# Patient Record
Sex: Female | Born: 1935 | Race: White | Hispanic: No | Marital: Married | Smoking: Never smoker
Health system: Southern US, Community
[De-identification: ages and names within clinical notes are randomized; demographics above are authoritative.]

## PROBLEM LIST (undated history)

## (undated) DIAGNOSIS — N3090 Cystitis, unspecified without hematuria: Secondary | ICD-10-CM

## (undated) DIAGNOSIS — E041 Nontoxic single thyroid nodule: Secondary | ICD-10-CM

## (undated) DIAGNOSIS — M8580 Other specified disorders of bone density and structure, unspecified site: Secondary | ICD-10-CM

## (undated) DIAGNOSIS — R011 Cardiac murmur, unspecified: Secondary | ICD-10-CM

## (undated) DIAGNOSIS — Z950 Presence of cardiac pacemaker: Secondary | ICD-10-CM

## (undated) DIAGNOSIS — C801 Malignant (primary) neoplasm, unspecified: Secondary | ICD-10-CM

## (undated) DIAGNOSIS — E039 Hypothyroidism, unspecified: Secondary | ICD-10-CM

## (undated) DIAGNOSIS — N183 Chronic kidney disease, stage 3 unspecified: Secondary | ICD-10-CM

## (undated) DIAGNOSIS — I82409 Acute embolism and thrombosis of unspecified deep veins of unspecified lower extremity: Secondary | ICD-10-CM

## (undated) DIAGNOSIS — I429 Cardiomyopathy, unspecified: Secondary | ICD-10-CM

## (undated) DIAGNOSIS — I10 Essential (primary) hypertension: Secondary | ICD-10-CM

## (undated) DIAGNOSIS — I472 Ventricular tachycardia: Secondary | ICD-10-CM

## (undated) DIAGNOSIS — R002 Palpitations: Secondary | ICD-10-CM

## (undated) DIAGNOSIS — M1990 Unspecified osteoarthritis, unspecified site: Secondary | ICD-10-CM

## (undated) DIAGNOSIS — I2510 Atherosclerotic heart disease of native coronary artery without angina pectoris: Secondary | ICD-10-CM

## (undated) DIAGNOSIS — R001 Bradycardia, unspecified: Secondary | ICD-10-CM

## (undated) HISTORY — PX: TONSILLECTOMY: SUR1361

## (undated) HISTORY — DX: Other specified disorders of bone density and structure, unspecified site: M85.80

## (undated) HISTORY — PX: CATARACT EXTRACTION W/ INTRAOCULAR LENS  IMPLANT, BILATERAL: SHX1307

## (undated) HISTORY — DX: Palpitations: R00.2

## (undated) HISTORY — PX: CHOLECYSTECTOMY: SHX55

## (undated) HISTORY — PX: DILATION AND CURETTAGE OF UTERUS: SHX78

## (undated) HISTORY — DX: Acute embolism and thrombosis of unspecified deep veins of unspecified lower extremity: I82.409

## (undated) HISTORY — PX: URETER SURGERY: SHX823

## (undated) HISTORY — DX: Malignant (primary) neoplasm, unspecified: C80.1

## (undated) HISTORY — DX: Nontoxic single thyroid nodule: E04.1

## (undated) HISTORY — DX: Cardiomyopathy, unspecified: I42.9

## (undated) HISTORY — DX: Essential (primary) hypertension: I10

## (undated) HISTORY — DX: Bradycardia, unspecified: R00.1

## (undated) HISTORY — PX: OTHER SURGICAL HISTORY: SHX169

---
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
## 2001-02-09 ENCOUNTER — Other Ambulatory Visit: Admission: RE | Admit: 2001-02-09 | Discharge: 2001-02-09 | Payer: Self-pay | Admitting: Emergency Medicine

## 2001-02-16 ENCOUNTER — Encounter: Payer: Self-pay | Admitting: Emergency Medicine

## 2001-02-16 ENCOUNTER — Encounter: Admission: RE | Admit: 2001-02-16 | Discharge: 2001-02-16 | Payer: Self-pay | Admitting: Emergency Medicine

## 2004-07-12 ENCOUNTER — Encounter: Admission: RE | Admit: 2004-07-12 | Discharge: 2004-07-12 | Payer: Self-pay | Admitting: Emergency Medicine

## 2004-07-25 ENCOUNTER — Encounter: Admission: RE | Admit: 2004-07-25 | Discharge: 2004-07-25 | Payer: Self-pay | Admitting: Emergency Medicine

## 2004-08-06 ENCOUNTER — Ambulatory Visit: Payer: Self-pay | Admitting: Internal Medicine

## 2004-09-04 ENCOUNTER — Encounter: Admission: RE | Admit: 2004-09-04 | Discharge: 2004-09-04 | Payer: Self-pay | Admitting: Emergency Medicine

## 2004-09-12 ENCOUNTER — Ambulatory Visit: Payer: Self-pay | Admitting: Internal Medicine

## 2004-09-19 ENCOUNTER — Ambulatory Visit: Payer: Self-pay | Admitting: Internal Medicine

## 2007-01-21 ENCOUNTER — Encounter: Admission: RE | Admit: 2007-01-21 | Discharge: 2007-01-21 | Payer: Self-pay | Admitting: Emergency Medicine

## 2008-03-23 ENCOUNTER — Encounter: Admission: RE | Admit: 2008-03-23 | Discharge: 2008-03-23 | Payer: Self-pay | Admitting: Emergency Medicine

## 2009-02-14 ENCOUNTER — Other Ambulatory Visit: Admission: RE | Admit: 2009-02-14 | Discharge: 2009-02-14 | Payer: Self-pay | Admitting: Geriatric Medicine

## 2009-03-28 ENCOUNTER — Encounter: Admission: RE | Admit: 2009-03-28 | Discharge: 2009-03-28 | Payer: Self-pay | Admitting: Geriatric Medicine

## 2009-07-31 ENCOUNTER — Encounter: Admission: RE | Admit: 2009-07-31 | Discharge: 2009-07-31 | Payer: Self-pay | Admitting: Internal Medicine

## 2009-09-13 ENCOUNTER — Encounter: Admission: RE | Admit: 2009-09-13 | Discharge: 2009-09-13 | Payer: Self-pay | Admitting: Geriatric Medicine

## 2010-02-16 ENCOUNTER — Ambulatory Visit: Payer: Self-pay | Admitting: Obstetrics and Gynecology

## 2010-02-21 ENCOUNTER — Ambulatory Visit: Payer: Self-pay | Admitting: Obstetrics and Gynecology

## 2010-02-21 ENCOUNTER — Encounter: Admission: RE | Admit: 2010-02-21 | Discharge: 2010-02-21 | Payer: Self-pay | Admitting: Obstetrics and Gynecology

## 2010-02-27 ENCOUNTER — Ambulatory Visit: Payer: Self-pay | Admitting: Obstetrics and Gynecology

## 2010-02-27 ENCOUNTER — Ambulatory Visit (HOSPITAL_BASED_OUTPATIENT_CLINIC_OR_DEPARTMENT_OTHER): Admission: RE | Admit: 2010-02-27 | Discharge: 2010-02-27 | Payer: Self-pay | Admitting: Obstetrics and Gynecology

## 2010-03-20 ENCOUNTER — Ambulatory Visit: Payer: Self-pay | Admitting: Obstetrics and Gynecology

## 2010-03-26 ENCOUNTER — Encounter: Admission: RE | Admit: 2010-03-26 | Discharge: 2010-03-26 | Payer: Self-pay | Admitting: Geriatric Medicine

## 2010-08-19 ENCOUNTER — Encounter: Payer: Self-pay | Admitting: Emergency Medicine

## 2011-04-15 ENCOUNTER — Other Ambulatory Visit: Payer: Self-pay | Admitting: Geriatric Medicine

## 2011-04-15 DIAGNOSIS — Z1231 Encounter for screening mammogram for malignant neoplasm of breast: Secondary | ICD-10-CM

## 2011-05-01 ENCOUNTER — Ambulatory Visit
Admission: RE | Admit: 2011-05-01 | Discharge: 2011-05-01 | Disposition: A | Discharge status: Home / Self Care | Payer: Medicare Other | Source: Ambulatory Visit | Attending: Geriatric Medicine | Admitting: Geriatric Medicine

## 2011-05-01 DIAGNOSIS — Z1231 Encounter for screening mammogram for malignant neoplasm of breast: Secondary | ICD-10-CM

## 2011-06-19 ENCOUNTER — Encounter: Payer: Self-pay | Admitting: Gynecology

## 2011-06-19 DIAGNOSIS — I10 Essential (primary) hypertension: Secondary | ICD-10-CM | POA: Insufficient documentation

## 2011-06-19 DIAGNOSIS — N840 Polyp of corpus uteri: Secondary | ICD-10-CM | POA: Insufficient documentation

## 2011-07-01 ENCOUNTER — Ambulatory Visit (INDEPENDENT_AMBULATORY_CARE_PROVIDER_SITE_OTHER): Payer: Medicare Other | Admitting: Obstetrics and Gynecology

## 2011-07-01 ENCOUNTER — Encounter: Payer: Self-pay | Admitting: Obstetrics and Gynecology

## 2011-07-01 ENCOUNTER — Other Ambulatory Visit (HOSPITAL_COMMUNITY)
Admission: RE | Admit: 2011-07-01 | Discharge: 2011-07-01 | Disposition: A | Discharge status: Home / Self Care | Payer: Medicare Other | Source: Ambulatory Visit | Attending: Obstetrics and Gynecology | Admitting: Obstetrics and Gynecology

## 2011-07-01 VITALS — BP 120/76 | Ht 63.0 in | Wt 132.0 lb

## 2011-07-01 DIAGNOSIS — N840 Polyp of corpus uteri: Secondary | ICD-10-CM

## 2011-07-01 DIAGNOSIS — M899 Disorder of bone, unspecified: Secondary | ICD-10-CM

## 2011-07-01 DIAGNOSIS — Z124 Encounter for screening for malignant neoplasm of cervix: Secondary | ICD-10-CM | POA: Insufficient documentation

## 2011-07-01 DIAGNOSIS — M8580 Other specified disorders of bone density and structure, unspecified site: Secondary | ICD-10-CM

## 2011-07-01 DIAGNOSIS — N952 Postmenopausal atrophic vaginitis: Secondary | ICD-10-CM

## 2011-07-01 DIAGNOSIS — R634 Abnormal weight loss: Secondary | ICD-10-CM

## 2011-07-01 NOTE — Progress Notes (Signed)
Patient came to see me today for further followup. The last time I saw her her was when I removed her endometrial polyp. Since then she's had no vaginal bleeding. She's had no pelvic pain. She is up-to-date on mammograms. She has had a weight loss of approximately 18 pounds. It's not been associated with any GI or GU symptoms. Her husband is very sick and she has changed how she cooks for him. There are much healthier diet now and I suspect that's the weight loss. She is also discussed this with her PCP. She does have vaginal dryness but they're not sexually active and is not enough of an issue to treat. She also has osteopenia and is due for a bone density. She takes calcium and vitamin D. She's had no fractures.  ROS: 12 system review done. Some of pertinent positives listed above. Other pertinent positives include hypothyroidism, cardiac arrhythmia, and hypertension.  Physical examination:  Kennon Portela present HEENT within normal limits. Neck: Thyroid not large. No masses. Supraclavicular nodes: not enlarged. Breasts: Examined in both sitting midline position. No skin changes and no masses. Abdomen: Soft no guarding rebound or masses or hernia. Pelvic: External: Within normal limits. BUS: Within normal limits. Vaginal:within normal limits. Good estrogen effect. No evidence of cystocele rectocele or enterocele. Cervix: clean. Uterus: Normal size and shape. Adnexa: No masses. Rectovaginal exam: Confirmatory and negative. Extremities: Within normal limits.  Assessment: #1. Osteopenia #2. Weight loss #3. Atrophic vaginitis #4. Endometrial polyp  Plan: Patient is scheduled bone density with next mammogram. I don't believe her weight losses is gynecological related. However if it continues I think we should do a pelvic ultrasound. Continue yearly mammograms.

## 2011-08-26 DIAGNOSIS — Z0000 Encounter for general adult medical examination without abnormal findings: Secondary | ICD-10-CM | POA: Diagnosis not present

## 2011-08-26 DIAGNOSIS — I10 Essential (primary) hypertension: Secondary | ICD-10-CM | POA: Diagnosis not present

## 2011-08-26 DIAGNOSIS — J309 Allergic rhinitis, unspecified: Secondary | ICD-10-CM | POA: Diagnosis not present

## 2011-09-02 DIAGNOSIS — E538 Deficiency of other specified B group vitamins: Secondary | ICD-10-CM | POA: Diagnosis not present

## 2011-09-02 DIAGNOSIS — Z23 Encounter for immunization: Secondary | ICD-10-CM | POA: Diagnosis not present

## 2011-12-09 DIAGNOSIS — I10 Essential (primary) hypertension: Secondary | ICD-10-CM | POA: Diagnosis not present

## 2011-12-09 DIAGNOSIS — E538 Deficiency of other specified B group vitamins: Secondary | ICD-10-CM | POA: Diagnosis not present

## 2012-01-15 DIAGNOSIS — R05 Cough: Secondary | ICD-10-CM | POA: Diagnosis not present

## 2012-01-15 DIAGNOSIS — R059 Cough, unspecified: Secondary | ICD-10-CM | POA: Diagnosis not present

## 2012-01-28 DIAGNOSIS — R5381 Other malaise: Secondary | ICD-10-CM | POA: Diagnosis not present

## 2012-01-28 DIAGNOSIS — I10 Essential (primary) hypertension: Secondary | ICD-10-CM | POA: Diagnosis not present

## 2012-01-28 DIAGNOSIS — R05 Cough: Secondary | ICD-10-CM | POA: Diagnosis not present

## 2012-01-28 DIAGNOSIS — R634 Abnormal weight loss: Secondary | ICD-10-CM | POA: Diagnosis not present

## 2012-01-28 DIAGNOSIS — R5383 Other fatigue: Secondary | ICD-10-CM | POA: Diagnosis not present

## 2012-01-28 DIAGNOSIS — J309 Allergic rhinitis, unspecified: Secondary | ICD-10-CM | POA: Diagnosis not present

## 2012-02-18 DIAGNOSIS — I8310 Varicose veins of unspecified lower extremity with inflammation: Secondary | ICD-10-CM | POA: Diagnosis not present

## 2012-02-18 DIAGNOSIS — I10 Essential (primary) hypertension: Secondary | ICD-10-CM | POA: Diagnosis not present

## 2012-02-18 DIAGNOSIS — R634 Abnormal weight loss: Secondary | ICD-10-CM | POA: Diagnosis not present

## 2012-02-18 DIAGNOSIS — Z1331 Encounter for screening for depression: Secondary | ICD-10-CM | POA: Diagnosis not present

## 2012-02-18 DIAGNOSIS — E039 Hypothyroidism, unspecified: Secondary | ICD-10-CM | POA: Diagnosis not present

## 2012-04-14 DIAGNOSIS — E538 Deficiency of other specified B group vitamins: Secondary | ICD-10-CM | POA: Diagnosis not present

## 2012-04-21 DIAGNOSIS — R609 Edema, unspecified: Secondary | ICD-10-CM | POA: Diagnosis not present

## 2012-04-21 DIAGNOSIS — I129 Hypertensive chronic kidney disease with stage 1 through stage 4 chronic kidney disease, or unspecified chronic kidney disease: Secondary | ICD-10-CM | POA: Diagnosis not present

## 2012-05-19 DIAGNOSIS — E538 Deficiency of other specified B group vitamins: Secondary | ICD-10-CM | POA: Diagnosis not present

## 2012-05-19 DIAGNOSIS — Z79899 Other long term (current) drug therapy: Secondary | ICD-10-CM | POA: Diagnosis not present

## 2012-05-19 DIAGNOSIS — I498 Other specified cardiac arrhythmias: Secondary | ICD-10-CM | POA: Diagnosis not present

## 2012-05-19 DIAGNOSIS — I10 Essential (primary) hypertension: Secondary | ICD-10-CM | POA: Diagnosis not present

## 2012-05-25 ENCOUNTER — Other Ambulatory Visit: Payer: Self-pay | Admitting: Obstetrics and Gynecology

## 2012-05-25 ENCOUNTER — Telehealth: Payer: Self-pay | Admitting: *Deleted

## 2012-05-25 DIAGNOSIS — M8580 Other specified disorders of bone density and structure, unspecified site: Secondary | ICD-10-CM

## 2012-05-25 DIAGNOSIS — Z1231 Encounter for screening mammogram for malignant neoplasm of breast: Secondary | ICD-10-CM

## 2012-05-25 NOTE — Telephone Encounter (Signed)
Pt called requesting order for bone density placed at [facility]. Per office note, order will be placed.

## 2012-06-22 DIAGNOSIS — E538 Deficiency of other specified B group vitamins: Secondary | ICD-10-CM | POA: Diagnosis not present

## 2012-06-23 DIAGNOSIS — I10 Essential (primary) hypertension: Secondary | ICD-10-CM | POA: Diagnosis not present

## 2012-06-23 DIAGNOSIS — I498 Other specified cardiac arrhythmias: Secondary | ICD-10-CM | POA: Diagnosis not present

## 2012-06-30 ENCOUNTER — Ambulatory Visit
Admission: RE | Admit: 2012-06-30 | Discharge: 2012-06-30 | Disposition: A | Discharge status: Home / Self Care | Payer: Medicare Other | Source: Ambulatory Visit | Attending: Obstetrics and Gynecology | Admitting: Obstetrics and Gynecology

## 2012-06-30 DIAGNOSIS — M8580 Other specified disorders of bone density and structure, unspecified site: Secondary | ICD-10-CM

## 2012-06-30 DIAGNOSIS — Z1231 Encounter for screening mammogram for malignant neoplasm of breast: Secondary | ICD-10-CM | POA: Diagnosis not present

## 2012-06-30 DIAGNOSIS — M899 Disorder of bone, unspecified: Secondary | ICD-10-CM | POA: Diagnosis not present

## 2012-06-30 DIAGNOSIS — M949 Disorder of cartilage, unspecified: Secondary | ICD-10-CM | POA: Diagnosis not present

## 2012-07-07 ENCOUNTER — Ambulatory Visit (INDEPENDENT_AMBULATORY_CARE_PROVIDER_SITE_OTHER): Payer: Medicare Other | Admitting: Obstetrics and Gynecology

## 2012-07-07 ENCOUNTER — Encounter: Payer: Self-pay | Admitting: Obstetrics and Gynecology

## 2012-07-07 VITALS — BP 130/70 | HR 56 | Ht 63.5 in | Wt 125.0 lb

## 2012-07-07 DIAGNOSIS — N950 Postmenopausal bleeding: Secondary | ICD-10-CM

## 2012-07-07 DIAGNOSIS — R634 Abnormal weight loss: Secondary | ICD-10-CM

## 2012-07-07 DIAGNOSIS — N952 Postmenopausal atrophic vaginitis: Secondary | ICD-10-CM

## 2012-07-07 DIAGNOSIS — M899 Disorder of bone, unspecified: Secondary | ICD-10-CM

## 2012-07-07 DIAGNOSIS — M949 Disorder of cartilage, unspecified: Secondary | ICD-10-CM | POA: Diagnosis not present

## 2012-07-07 DIAGNOSIS — M8580 Other specified disorders of bone density and structure, unspecified site: Secondary | ICD-10-CM

## 2012-07-07 NOTE — Patient Instructions (Signed)
Report new vaginal bleeding.

## 2012-07-07 NOTE — Progress Notes (Signed)
Patient came to see me today for further followup. She just had a mammogram which was normal. She does have a bone density which showed osteopenia with progressive bone loss in her wrist. She now has an elevated fracture risk in her hip of 3.6%. She takes calcium and vitamin D. She has not had a fracture. Since we removed her endometrial polyp she's had no further postmenopausal bleeding. She is having no pelvic pain. She has always had normal Pap smears.Her last Pap smear was 2012. She does have atrophic vaginitis but is not symptomatic enough to require treatment. She is not sexually active due to her husband's health. Laser we discussed that she had lost 18 pounds. This year she has lost an additional 7 pounds. Her husband is in poor health and she attributes the  weight loss her being a caretaker.  ROS: 12 system review done. Pertinent positives above. Other positive is  Hypertension.  Physical examination:Michelle Winters present. HEENT within normal limits. Neck: Thyroid not large. No masses. Supraclavicular nodes: not enlarged. Breasts: Examined in both sitting and lying  position. No skin changes and no masses. Abdomen: Soft no guarding rebound or masses or hernia. Pelvic: External: Within normal limits. BUS: Within normal limits. Vaginal:within normal limits. Poor estrogen effect. No evidence of cystocele rectocele or enterocele. Cervix: clean. Uterus: Normal size and shape. Adnexa: No masses. Rectovaginal exam: Confirmatory and negative. Extremities: Within normal limits.  Assessment: #1. Atrophic vaginitis #2. Osteopenia #3. Endometrial polyp with postmenopausal bleeding #4. Weight loss  Plan: We discussed that her bone density had  Gotten  Worse. We discussed biphosphonate treatment. She understands the risks. For the moment she declined. Pap not done.The new Pap smear guidelines were discussed with the patient. Observation of atrophic vaginitis. She will report new bleeding.Continue to watch  weight.

## 2012-07-14 DIAGNOSIS — R079 Chest pain, unspecified: Secondary | ICD-10-CM | POA: Diagnosis not present

## 2012-07-14 DIAGNOSIS — I10 Essential (primary) hypertension: Secondary | ICD-10-CM | POA: Diagnosis not present

## 2012-07-14 DIAGNOSIS — Z79899 Other long term (current) drug therapy: Secondary | ICD-10-CM | POA: Diagnosis not present

## 2012-08-24 DIAGNOSIS — R05 Cough: Secondary | ICD-10-CM | POA: Diagnosis not present

## 2012-08-24 DIAGNOSIS — E538 Deficiency of other specified B group vitamins: Secondary | ICD-10-CM | POA: Diagnosis not present

## 2012-09-25 DIAGNOSIS — E538 Deficiency of other specified B group vitamins: Secondary | ICD-10-CM | POA: Diagnosis not present

## 2012-09-25 DIAGNOSIS — I10 Essential (primary) hypertension: Secondary | ICD-10-CM | POA: Diagnosis not present

## 2012-09-25 DIAGNOSIS — R609 Edema, unspecified: Secondary | ICD-10-CM | POA: Diagnosis not present

## 2012-10-16 DIAGNOSIS — I10 Essential (primary) hypertension: Secondary | ICD-10-CM | POA: Diagnosis not present

## 2012-11-09 DIAGNOSIS — E538 Deficiency of other specified B group vitamins: Secondary | ICD-10-CM | POA: Diagnosis not present

## 2013-01-04 DIAGNOSIS — E538 Deficiency of other specified B group vitamins: Secondary | ICD-10-CM | POA: Diagnosis not present

## 2013-02-08 DIAGNOSIS — G4730 Sleep apnea, unspecified: Secondary | ICD-10-CM | POA: Diagnosis not present

## 2013-02-08 DIAGNOSIS — Z1331 Encounter for screening for depression: Secondary | ICD-10-CM | POA: Diagnosis not present

## 2013-02-08 DIAGNOSIS — I872 Venous insufficiency (chronic) (peripheral): Secondary | ICD-10-CM | POA: Diagnosis not present

## 2013-02-08 DIAGNOSIS — E039 Hypothyroidism, unspecified: Secondary | ICD-10-CM | POA: Diagnosis not present

## 2013-02-08 DIAGNOSIS — Z79899 Other long term (current) drug therapy: Secondary | ICD-10-CM | POA: Diagnosis not present

## 2013-02-08 DIAGNOSIS — Z0000 Encounter for general adult medical examination without abnormal findings: Secondary | ICD-10-CM | POA: Diagnosis not present

## 2013-02-16 DIAGNOSIS — E538 Deficiency of other specified B group vitamins: Secondary | ICD-10-CM | POA: Diagnosis not present

## 2013-03-24 DIAGNOSIS — E538 Deficiency of other specified B group vitamins: Secondary | ICD-10-CM | POA: Diagnosis not present

## 2013-04-12 DIAGNOSIS — I495 Sick sinus syndrome: Secondary | ICD-10-CM | POA: Diagnosis not present

## 2013-04-12 DIAGNOSIS — I499 Cardiac arrhythmia, unspecified: Secondary | ICD-10-CM | POA: Diagnosis not present

## 2013-04-12 DIAGNOSIS — R5381 Other malaise: Secondary | ICD-10-CM | POA: Diagnosis not present

## 2013-04-12 DIAGNOSIS — Z79899 Other long term (current) drug therapy: Secondary | ICD-10-CM | POA: Diagnosis not present

## 2013-04-12 DIAGNOSIS — L299 Pruritus, unspecified: Secondary | ICD-10-CM | POA: Diagnosis not present

## 2013-04-12 DIAGNOSIS — I498 Other specified cardiac arrhythmias: Secondary | ICD-10-CM | POA: Diagnosis not present

## 2013-04-12 DIAGNOSIS — I129 Hypertensive chronic kidney disease with stage 1 through stage 4 chronic kidney disease, or unspecified chronic kidney disease: Secondary | ICD-10-CM | POA: Diagnosis not present

## 2013-04-12 DIAGNOSIS — E538 Deficiency of other specified B group vitamins: Secondary | ICD-10-CM | POA: Diagnosis not present

## 2013-04-15 DIAGNOSIS — I499 Cardiac arrhythmia, unspecified: Secondary | ICD-10-CM | POA: Diagnosis not present

## 2013-04-19 DIAGNOSIS — I498 Other specified cardiac arrhythmias: Secondary | ICD-10-CM | POA: Diagnosis not present

## 2013-04-22 DIAGNOSIS — I471 Supraventricular tachycardia: Secondary | ICD-10-CM | POA: Diagnosis not present

## 2013-04-22 DIAGNOSIS — I498 Other specified cardiac arrhythmias: Secondary | ICD-10-CM | POA: Diagnosis not present

## 2013-04-22 DIAGNOSIS — I4949 Other premature depolarization: Secondary | ICD-10-CM | POA: Diagnosis not present

## 2013-04-22 DIAGNOSIS — I10 Essential (primary) hypertension: Secondary | ICD-10-CM | POA: Diagnosis not present

## 2013-04-26 DIAGNOSIS — E538 Deficiency of other specified B group vitamins: Secondary | ICD-10-CM | POA: Diagnosis not present

## 2013-05-06 ENCOUNTER — Ambulatory Visit (HOSPITAL_BASED_OUTPATIENT_CLINIC_OR_DEPARTMENT_OTHER): Payer: Medicare Other | Admitting: Radiology

## 2013-05-06 ENCOUNTER — Ambulatory Visit (HOSPITAL_COMMUNITY): Payer: Medicare Other | Attending: Geriatric Medicine | Admitting: Radiology

## 2013-05-06 ENCOUNTER — Other Ambulatory Visit (HOSPITAL_COMMUNITY): Payer: Self-pay | Admitting: Geriatric Medicine

## 2013-05-06 VITALS — BP 112/89 | HR 52 | Ht 63.0 in | Wt 128.0 lb

## 2013-05-06 DIAGNOSIS — I495 Sick sinus syndrome: Secondary | ICD-10-CM | POA: Diagnosis not present

## 2013-05-06 DIAGNOSIS — R002 Palpitations: Secondary | ICD-10-CM | POA: Diagnosis not present

## 2013-05-06 DIAGNOSIS — Z8249 Family history of ischemic heart disease and other diseases of the circulatory system: Secondary | ICD-10-CM | POA: Diagnosis not present

## 2013-05-06 DIAGNOSIS — I10 Essential (primary) hypertension: Secondary | ICD-10-CM | POA: Insufficient documentation

## 2013-05-06 DIAGNOSIS — I4510 Unspecified right bundle-branch block: Secondary | ICD-10-CM | POA: Insufficient documentation

## 2013-05-06 DIAGNOSIS — R5381 Other malaise: Secondary | ICD-10-CM | POA: Diagnosis not present

## 2013-05-06 DIAGNOSIS — I4949 Other premature depolarization: Secondary | ICD-10-CM

## 2013-05-06 DIAGNOSIS — R079 Chest pain, unspecified: Secondary | ICD-10-CM

## 2013-05-06 NOTE — Progress Notes (Signed)
Echocardiogram performed.  

## 2013-05-06 NOTE — Progress Notes (Signed)
[facility] SITE 3 NUCLEAR MED [address] [place], [place] [address] [phone]    Cardiology Nuclear Med Study  Michelle Winters is a 79 y.o. female     MRN : [id number]     DOB: 1932-01-08  Procedure Date: 05/06/2013  Nuclear Med Background Indication for Stress Test:  Evaluation for Ischemia History:  Hx of GXT- Normal Cardiac Risk Factors: Family History - CAD, Hypertension and RBBB  Symptoms:  Fatigue and Palpitations    Nuclear Pre-Procedure Caffeine/Decaff Intake:  None NPO After: 10:00pm    O2 Sat: 98% on room air. IV 0.9% NS with Angio Cath:  22g  IV Site: R Antecubital  IV Started by:  Bonnita Levan, RN  Chest Size (in):  36 Cup Size: B  Height: 5\' 3"  (1.6 m)  Weight:  128 lb (58.06 kg)  BMI:  Body mass index is 22.68 kg/(m^2). Tech Comments:  [name]    Nuclear Med Study 1 or 2 day study: 1 day  Stress Test Type:  Stress  Reading MD: Armanda Magic, MD  Order Authorizing Provider:  Armanda Magic, MD  Resting Radionuclide: Technetium 9m Sestamibi  Resting Radionuclide Dose: 11.0 mCi   Stress Radionuclide:  Technetium 82m Sestamibi  Stress Radionuclide Dose: 33.0 mCi           Stress Protocol Rest HR: 52 Stress HR: 134  Rest BP: 112/89 Stress BP: 213/111  Exercise Time (min): 3:30 METS: 4.6   Predicted Max HR: 141 bpm % Max HR: 95.04 bpm Rate Pressure Product: 46962   Dose of Adenosine (mg):  n/a Dose of Lexiscan: n/a mg  Dose of Atropine (mg): n/a Dose of Dobutamine: n/a mcg/kg/min (at max HR)  Stress Test Technologist: Nelson Chimes, BS-ES  Nuclear Technologist:  Domenic Polite, CNMT     Rest Procedure:  Myocardial perfusion imaging was performed at rest 45 minutes following the intravenous administration of Technetium 38m Sestamibi. Rest ECG: NSR-RBBB and LAHB  Stress Procedure:  The patient exercised on the treadmill utilizing the Bruce Protocol for 3:30 minutes. The patient stopped due to 3-4/10 chest tightness and burning.  Technetium 67m  Sestamibi was injected at peak exercise and myocardial perfusion imaging was performed after a brief delay. Stress ECG: No significant ST segment change suggestive of ischemia. and Insignificant upsloping ST segment depression.  QPS Raw Data Images:  Mild breast attenuation.  Normal left ventricular size. Stress Images:  There is decreased uptake in the anterior wall. Rest Images:  There is decreased uptake in the anterior wall. Subtraction (SDS):  There is a fixed anteriour defect that is most consistent with breast attenuation. Transient Ischemic Dilatation (Normal <1.22):  n/a Lung/Heart Ratio (Normal <0.45):  0.39  Quantitative Gated Spect Images QGS EDV:  92 ml QGS ESV:  47 ml  Impression Exercise Capacity:  Poor exercise capacity. BP Response:  Hypertensive blood pressure response. Clinical Symptoms:  Typical chest pain. ECG Impression:  Significant ST abnormalities consistent with ischemia. Comparison with Prior Nuclear Study: No images to compare  Overall Impression:  Intermediate risk stress nuclear study with ECG and clinical evidence of ischemia but fixed perfusion defect..  LV Ejection Fraction: 49%.  LV Wall Motion:  NL LV Function; NL Wall Motion

## 2013-05-13 ENCOUNTER — Ambulatory Visit: Payer: Medicare Other | Admitting: Cardiology

## 2013-05-13 NOTE — Progress Notes (Signed)
Pt is scheduled for 10/24 with EP

## 2013-05-21 ENCOUNTER — Encounter: Payer: Self-pay | Admitting: Internal Medicine

## 2013-05-21 ENCOUNTER — Encounter (INDEPENDENT_AMBULATORY_CARE_PROVIDER_SITE_OTHER): Payer: Self-pay

## 2013-05-21 ENCOUNTER — Ambulatory Visit (INDEPENDENT_AMBULATORY_CARE_PROVIDER_SITE_OTHER): Payer: Medicare Other | Admitting: Internal Medicine

## 2013-05-21 ENCOUNTER — Telehealth: Payer: Self-pay

## 2013-05-21 VITALS — BP 122/66 | HR 70 | Ht 63.0 in | Wt 131.0 lb

## 2013-05-21 DIAGNOSIS — I498 Other specified cardiac arrhythmias: Secondary | ICD-10-CM

## 2013-05-21 DIAGNOSIS — R001 Bradycardia, unspecified: Secondary | ICD-10-CM

## 2013-05-21 NOTE — Telephone Encounter (Signed)
Received call from Hilda Lias at Dr.Stoneking's office she stated she spoke to Dr.Klein and he requested 04/12/13 monitor strips.She stated they did not have monitor strips that Dr.Turner had strips.Message sent to Dr.Klein.

## 2013-05-21 NOTE — Patient Instructions (Signed)
Your physician recommends that you schedule a follow-up appointment in: 4 WEEKS WITH DR Graciela Husbands

## 2013-05-21 NOTE — Progress Notes (Signed)
Did not remember the history and physical so have deleted note  She was seen subsequently and chart updated

## 2013-05-24 DIAGNOSIS — R5381 Other malaise: Secondary | ICD-10-CM | POA: Diagnosis not present

## 2013-05-24 DIAGNOSIS — I10 Essential (primary) hypertension: Secondary | ICD-10-CM | POA: Diagnosis not present

## 2013-05-31 DIAGNOSIS — E538 Deficiency of other specified B group vitamins: Secondary | ICD-10-CM | POA: Diagnosis not present

## 2013-06-17 ENCOUNTER — Ambulatory Visit (INDEPENDENT_AMBULATORY_CARE_PROVIDER_SITE_OTHER): Payer: Medicare Other | Admitting: Internal Medicine

## 2013-06-17 ENCOUNTER — Encounter: Payer: Self-pay | Admitting: Internal Medicine

## 2013-06-17 VITALS — BP 132/58 | HR 67 | Ht 63.0 in | Wt 132.8 lb

## 2013-06-17 DIAGNOSIS — I4949 Other premature depolarization: Secondary | ICD-10-CM

## 2013-06-17 DIAGNOSIS — R9439 Abnormal result of other cardiovascular function study: Secondary | ICD-10-CM

## 2013-06-17 DIAGNOSIS — R931 Abnormal findings on diagnostic imaging of heart and coronary circulation: Secondary | ICD-10-CM

## 2013-06-17 DIAGNOSIS — I498 Other specified cardiac arrhythmias: Secondary | ICD-10-CM | POA: Diagnosis not present

## 2013-06-17 DIAGNOSIS — R001 Bradycardia, unspecified: Secondary | ICD-10-CM

## 2013-06-17 DIAGNOSIS — I10 Essential (primary) hypertension: Secondary | ICD-10-CM | POA: Diagnosis not present

## 2013-06-17 DIAGNOSIS — I493 Ventricular premature depolarization: Secondary | ICD-10-CM

## 2013-06-17 NOTE — Progress Notes (Signed)
      Patient Care Team: Hal Stoneking, MD as PCP - General (Internal Medicine)   HPI  Michelle Winters is a 79 y.o. female  who was seen him in pursuing to and for which I did not write a note and had forgotten we talked about. She had referred for bradycardia occurred in the context of what we now know her PVCs that are quite frequent. She also undergone stress testing and echocardiogram [facility] which showed inferior wall hypokinesis and an intermediate risk stress nuclear study with evidence of ST segment changes as well as stress-induced ischemia in the inferoapical and lateral segments.  She over the last month has taken her pulse daily and has noted a good correlation between heart rates which he palpates  in the 40 range versus a 50-60 range. She does not have peripheral edema. Past Medical History  Diagnosis Date  . Endometrial polyp   . Thyroid disease   . Heart palpitations   . Hypertension     Past Surgical History  Procedure Laterality Date  . Cholecystectomy    . Dilation and curettage of uterus    . Hysteroscopy    . Tonsillectomy      Current Outpatient Prescriptions  Medication Sig Dispense Refill  . aspirin 81 MG tablet Take 81 mg by mouth daily.      . hydrochlorothiazide (HYDRODIURIL) 25 MG tablet Take 25 mg by mouth daily.        . levothyroxine (SYNTHROID, LEVOTHROID) 50 MCG tablet Take 50 mcg by mouth daily.        . loratadine (CLARITIN) 10 MG tablet Take 10 mg by mouth as needed for allergies.      . losartan (COZAAR) 25 MG tablet Take 25 mg by mouth daily.      . sodium chloride (BRONCHO SALINE) inhaler solution Take 1 spray by nebulization as needed.       No current facility-administered medications for this visit.    No Known Allergies  Review of Systems negative except from HPI and PMH  Physical Exam BP 132/58  Pulse 67  Ht 5' 3" (1.6 m)  Wt 132 lb 12.8 oz (60.238 kg)  BMI 23.53 kg/m2 Well developed and well nourished in no acute  distress HENT normal E scleral and icterus clear Neck Supple JVP flat; carotids brisk and full Clear to ausculation Irregular rate and rhythm, consistent with PVCs no murmurs gallops or rub Soft with active bowel sounds No clubbing cyanosis none Edema Alert and oriented, grossly normal motor and sensory function Skin Warm and Dry    Assessment and  Plan  

## 2013-06-17 NOTE — Assessment & Plan Note (Signed)
As above.

## 2013-06-17 NOTE — Assessment & Plan Note (Signed)
She is evidence of inferolateral ischemia. I reviewed this with Dr. Verlin Fester. There is also ST segment changes consistent with ischemia. We'll undertake catheterization.

## 2013-06-17 NOTE — Assessment & Plan Note (Signed)
She has symptomatic PVCs. They are of the removal morphologies mostly of right bundle branch block origin. Given her abnormal Myoview scan we have discussed the importance of clarifying her coronary anatomy as a potential contributor to her ventricular ectopy.  And discussed potential benefits as well as risks of the procedure.  As she has sinus bradycardia treatment of her ectopy with beta blockers and  calcium blockers is precluded. I should also note that at 1 minute into her stress test her systolic blood pressure was over 200.

## 2013-06-17 NOTE — Patient Instructions (Signed)
Your physician has requested that you have a cardiac catheterization. Cardiac catheterization is used to diagnose and/or treat various heart conditions. Doctors may recommend this procedure for a number of different reasons. The most common reason is to evaluate chest pain. Chest pain can be a symptom of coronary artery disease (CAD), and cardiac catheterization can show whether plaque is narrowing or blocking your heart's arteries. This procedure is also used to evaluate the valves, as well as measure the blood flow and oxygen levels in different parts of your heart. For further information please visit [email or web address] Please follow instruction sheet, as given.  Your physician recommends that you return for lab work in: will be informed once procedure date has been set  Your physician recommends that you continue on your current medications as directed. Please refer to the Current Medication list given to you today. '

## 2013-06-22 ENCOUNTER — Other Ambulatory Visit: Payer: Self-pay | Admitting: *Deleted

## 2013-06-22 ENCOUNTER — Encounter: Payer: Self-pay | Admitting: *Deleted

## 2013-06-22 ENCOUNTER — Telehealth: Payer: Self-pay | Admitting: *Deleted

## 2013-06-22 DIAGNOSIS — I998 Other disorder of circulatory system: Secondary | ICD-10-CM

## 2013-06-22 DIAGNOSIS — R9439 Abnormal result of other cardiovascular function study: Secondary | ICD-10-CM

## 2013-06-22 DIAGNOSIS — I259 Chronic ischemic heart disease, unspecified: Secondary | ICD-10-CM

## 2013-06-22 NOTE — Telephone Encounter (Signed)
Called patient to schedule cardiac catheterization - 07/01/13. Lab work 06/28/2013. Patient agreeable to plan.

## 2013-06-28 ENCOUNTER — Other Ambulatory Visit (INDEPENDENT_AMBULATORY_CARE_PROVIDER_SITE_OTHER): Payer: Medicare Other

## 2013-06-28 ENCOUNTER — Encounter (INDEPENDENT_AMBULATORY_CARE_PROVIDER_SITE_OTHER): Payer: Self-pay

## 2013-06-28 DIAGNOSIS — I2589 Other forms of chronic ischemic heart disease: Secondary | ICD-10-CM | POA: Diagnosis not present

## 2013-06-28 DIAGNOSIS — R9439 Abnormal result of other cardiovascular function study: Secondary | ICD-10-CM

## 2013-06-28 DIAGNOSIS — I999 Unspecified disorder of circulatory system: Secondary | ICD-10-CM | POA: Diagnosis not present

## 2013-06-28 DIAGNOSIS — I259 Chronic ischemic heart disease, unspecified: Secondary | ICD-10-CM

## 2013-06-28 DIAGNOSIS — Z01810 Encounter for preprocedural cardiovascular examination: Secondary | ICD-10-CM | POA: Diagnosis not present

## 2013-06-28 DIAGNOSIS — I998 Other disorder of circulatory system: Secondary | ICD-10-CM

## 2013-06-28 HISTORY — PX: CARDIAC CATHETERIZATION: SHX172

## 2013-06-28 LAB — CBC WITH DIFFERENTIAL/PLATELET
Basophils Relative: 0.5 % (ref 0.0–3.0)
Eosinophils Relative: 2.8 % (ref 0.0–5.0)
Hemoglobin: 13.1 g/dL (ref 12.0–15.0)
Lymphocytes Relative: 20.8 % (ref 12.0–46.0)
Monocytes Relative: 6.9 % (ref 3.0–12.0)
Neutro Abs: 3.5 10*3/uL (ref 1.4–7.7)
Neutrophils Relative %: 69 % (ref 43.0–77.0)
RBC: 4.29 Mil/uL (ref 3.87–5.11)
WBC: 5 10*3/uL (ref 4.5–10.5)

## 2013-06-28 LAB — BASIC METABOLIC PANEL
CO2: 27 mEq/L (ref 19–32)
Calcium: 8.6 mg/dL (ref 8.4–10.5)
GFR: 60.91 mL/min (ref >60.00)
Sodium: 141 mEq/L (ref 135–145)

## 2013-06-28 LAB — PROTIME-INR: INR: 1.1 ratio — ABNORMAL HIGH (ref 0.8–1.0)

## 2013-06-29 ENCOUNTER — Encounter (HOSPITAL_COMMUNITY): Payer: Self-pay | Admitting: Pharmacy Technician

## 2013-07-01 ENCOUNTER — Encounter (HOSPITAL_COMMUNITY)
Admission: RE | Disposition: A | Discharge status: Home / Self Care | Payer: Self-pay | Source: Ambulatory Visit | Attending: Cardiovascular Disease

## 2013-07-01 ENCOUNTER — Ambulatory Visit (HOSPITAL_COMMUNITY)
Admission: RE | Admit: 2013-07-01 | Discharge: 2013-07-01 | Disposition: A | Discharge status: Home / Self Care | Payer: Medicare Other | Source: Ambulatory Visit | Attending: Cardiovascular Disease | Admitting: Cardiovascular Disease

## 2013-07-01 DIAGNOSIS — I10 Essential (primary) hypertension: Secondary | ICD-10-CM | POA: Diagnosis not present

## 2013-07-01 DIAGNOSIS — R9439 Abnormal result of other cardiovascular function study: Secondary | ICD-10-CM | POA: Insufficient documentation

## 2013-07-01 DIAGNOSIS — Z7982 Long term (current) use of aspirin: Secondary | ICD-10-CM | POA: Insufficient documentation

## 2013-07-01 DIAGNOSIS — R9430 Abnormal result of cardiovascular function study, unspecified: Secondary | ICD-10-CM

## 2013-07-01 HISTORY — PX: LEFT HEART CATHETERIZATION WITH CORONARY ANGIOGRAM: SHX5451

## 2013-07-01 LAB — BASIC METABOLIC PANEL
BUN: 21 mg/dL (ref 6–23)
Calcium: 8.7 mg/dL (ref 8.4–10.5)
Creatinine, Ser: 0.87 mg/dL (ref 0.50–1.10)
GFR calc Af Amer: 72 mL/min — ABNORMAL LOW (ref >90)
GFR calc non Af Amer: 62 mL/min — ABNORMAL LOW (ref >90)

## 2013-07-01 SURGERY — LEFT HEART CATHETERIZATION WITH CORONARY ANGIOGRAM
Anesthesia: LOCAL

## 2013-07-01 MED ORDER — SODIUM CHLORIDE 0.9 % IV SOLN: 1.0000 mL/kg/h | INTRAVENOUS | Status: DC

## 2013-07-01 MED ORDER — HEPARIN (PORCINE) IN NACL 2-0.9 UNIT/ML-% IJ SOLN
INTRAMUSCULAR | Status: AC
  Filled 2013-07-01: qty 500

## 2013-07-01 MED ORDER — SODIUM CHLORIDE 0.9 % IV SOLN
INTRAVENOUS | Status: DC
  Administered 2013-07-01: 10:00:00 via INTRAVENOUS

## 2013-07-01 MED ORDER — SODIUM CHLORIDE 0.9 % IJ SOLN: 3.0000 mL | Freq: Two times a day (BID) | INTRAMUSCULAR | Status: DC

## 2013-07-01 MED ORDER — LIDOCAINE HCL (PF) 1 % IJ SOLN
INTRAMUSCULAR | Status: AC
  Filled 2013-07-01: qty 30

## 2013-07-01 MED ORDER — FENTANYL CITRATE 0.05 MG/ML IJ SOLN
INTRAMUSCULAR | Status: AC
  Filled 2013-07-01: qty 2

## 2013-07-01 MED ORDER — SODIUM CHLORIDE 0.9 % IJ SOLN: 3.0000 mL | INTRAMUSCULAR | Status: DC | PRN

## 2013-07-01 MED ORDER — MIDAZOLAM HCL 2 MG/2ML IJ SOLN
INTRAMUSCULAR | Status: AC
  Filled 2013-07-01: qty 2

## 2013-07-01 MED ORDER — SODIUM CHLORIDE 0.9 % IV SOLN: 250.0000 mL | INTRAVENOUS | Status: DC | PRN

## 2013-07-01 MED ORDER — ASPIRIN 81 MG PO CHEW
81.0000 mg | CHEWABLE_TABLET | ORAL | Status: AC
  Administered 2013-07-01: 81 mg via ORAL
  Filled 2013-07-01: qty 1

## 2013-07-01 NOTE — Progress Notes (Signed)
Pt received form cath lab alert and able to tolerate food and fluid.

## 2013-07-01 NOTE — Progress Notes (Signed)
Discharge instructon given per MD order.  Pt and caergiver able to verbalize understanding.  Pt to car via wheelchair.

## 2013-07-01 NOTE — CV Procedure (Signed)
   Cardiac Catheterization Procedure Note  Name: ADRIENE KNIPFER MRN: [id number] DOB: 1932/01/11  Procedure: Left Heart Cath, Selective Coronary Angiography, LV angiography  Indication: Abnormal Myoview scan  Procedural details: The right groin was prepped, draped, and anesthetized with 1% lidocaine. Using modified Seldinger technique, a 5 French sheath was introduced into the right femoral artery. Standard Judkins catheters were used for coronary angiography and left ventriculography. Catheter exchanges were performed over a guidewire. There were no immediate procedural complications. The patient was transferred to the post catheterization recovery area for further monitoring.  Procedural Findings: Hemodynamics:  AO 132/56 LV 139/4   Coronary angiography: Coronary dominance: right  Left mainstem: short segment, no obstructive disease.  Left anterior descending (LAD): Widely patent to the LV apex. The diagonal branches are small. There is no obstructive disease noted.   Left circumflex (LCx): Dominant vessel, normal in appearance. The OM and left PLA/PDA branches are patent  Right coronary artery (RCA): small, nondominant vessel. Catheter-induced spasm at the ostium.   Left ventriculography: Left ventricular systolic function is low-normal, LVEF is estimated at 50-55%, there is no significant mitral regurgitation   Final Conclusions:   1. Widely patent coronary arteries 2. Low normal LV function with normal LVEDP  Tonny Bollman 07/01/2013, 10:52 AM

## 2013-07-01 NOTE — Interval H&P Note (Signed)
Cath Lab Visit (complete for each Cath Lab visit)  Clinical Evaluation Leading to the Procedure:   ACS: no  Non-ACS:    Anginal Classification: No Symptoms  Anti-ischemic medical therapy: No Therapy  Non-Invasive Test Results: Intermediate-risk stress test findings: cardiac mortality 1-3%/year  Prior CABG: No previous CABG      History and Physical Interval Note:  07/01/2013 10:24 AM  Michelle Winters  has presented today for surgery, with the diagnosis of as  The various methods of treatment have been discussed with the patient and family. After consideration of risks, benefits and other options for treatment, the patient has consented to  Procedure(s): LEFT HEART CATHETERIZATION WITH CORONARY ANGIOGRAM ([name]) as a surgical intervention .  The patient's history has been reviewed, patient examined, no change in status, stable for surgery.  I have reviewed the patient's chart and labs.  Questions were answered to the patient's satisfaction.     Tonny Bollman

## 2013-07-01 NOTE — H&P (View-Only) (Signed)
      Patient Care Team: Merlene Laughter, MD as PCP - General (Internal Medicine)   HPI  Michelle Winters is a 79 y.o. female  who was seen him in pursuing to and for which I did not write a note and had forgotten we talked about. She had referred for bradycardia occurred in the context of what we now know her PVCs that are quite frequent. She also undergone stress testing and echocardiogram [facility] which showed inferior wall hypokinesis and an intermediate risk stress nuclear study with evidence of ST segment changes as well as stress-induced ischemia in the inferoapical and lateral segments.  She over the last month has taken her pulse daily and has noted a good correlation between heart rates which he palpates  in the 40 range versus a 50-60 range. She does not have peripheral edema. Past Medical History  Diagnosis Date  . Endometrial polyp   . Thyroid disease   . Heart palpitations   . Hypertension     Past Surgical History  Procedure Laterality Date  . Cholecystectomy    . Dilation and curettage of uterus    . Hysteroscopy    . Tonsillectomy      Current Outpatient Prescriptions  Medication Sig Dispense Refill  . aspirin 81 MG tablet Take 81 mg by mouth daily.      . hydrochlorothiazide (HYDRODIURIL) 25 MG tablet Take 25 mg by mouth daily.        [name] levothyroxine (SYNTHROID, LEVOTHROID) 50 MCG tablet Take 50 mcg by mouth daily.        [name] loratadine (CLARITIN) 10 MG tablet Take 10 mg by mouth as needed for allergies.      [name] losartan (COZAAR) 25 MG tablet Take 25 mg by mouth daily.      . sodium chloride (BRONCHO SALINE) inhaler solution Take 1 spray by nebulization as needed.       No current facility-administered medications for this visit.    No Known Allergies  Review of Systems negative except from HPI and PMH  Physical Exam BP 132/58  Pulse 67  Ht 5\' 3"  (1.6 m)  Wt 132 lb 12.8 oz (60.238 kg)  BMI 23.53 kg/m2 Well developed and well nourished in no acute  distress HENT normal E scleral and icterus clear Neck Supple JVP flat; carotids brisk and full Clear to ausculation Irregular rate and rhythm, consistent with PVCs no murmurs gallops or rub Soft with active bowel sounds No clubbing cyanosis none Edema Alert and oriented, grossly normal motor and sensory function Skin Warm and Dry    Assessment and  Plan

## 2013-07-02 ENCOUNTER — Telehealth: Payer: Self-pay | Admitting: Internal Medicine

## 2013-07-02 NOTE — Telephone Encounter (Signed)
Patient inquiring about follow up? I explained I would review this with Dr. Graciela Husbands and get back with the patient next week.  Also advised her not to lift more that 15pds for the next week, per her question. Patient verbalized understanding and agreeable to plan.

## 2013-07-02 NOTE — Telephone Encounter (Signed)
New problem:  Pt is calling to speak to the nurse. Pt has questions regarding her procedure she had yesterday. Pt states she will give more details when the nurse calls.

## 2013-07-05 DIAGNOSIS — E538 Deficiency of other specified B group vitamins: Secondary | ICD-10-CM | POA: Diagnosis not present

## 2013-07-08 ENCOUNTER — Ambulatory Visit (INDEPENDENT_AMBULATORY_CARE_PROVIDER_SITE_OTHER): Payer: Medicare Other | Admitting: Gynecology

## 2013-07-08 ENCOUNTER — Encounter: Payer: Self-pay | Admitting: Gynecology

## 2013-07-08 VITALS — BP 120/76 | Ht 64.0 in | Wt 131.0 lb

## 2013-07-08 DIAGNOSIS — I863 Vulval varices: Secondary | ICD-10-CM | POA: Diagnosis not present

## 2013-07-08 DIAGNOSIS — N952 Postmenopausal atrophic vaginitis: Secondary | ICD-10-CM

## 2013-07-08 DIAGNOSIS — M899 Disorder of bone, unspecified: Secondary | ICD-10-CM | POA: Diagnosis not present

## 2013-07-08 DIAGNOSIS — M8580 Other specified disorders of bone density and structure, unspecified site: Secondary | ICD-10-CM

## 2013-07-08 NOTE — Progress Notes (Signed)
Michelle Winters 06-25-1932 [id number]        79 y.o.  G2P2002 for followup exam.  Former patient of Dr. Eda Labelle. Several issues noted below.  Past medical history,surgical history, problem list, medications, allergies, family history and social history were all reviewed and documented in the EPIC chart.  ROS:  Performed and pertinent positives and negatives are included in the history, assessment and plan .  Exam: Kim assistant Filed Vitals:   07/08/13 1137  BP: 120/76  Height: 5\' 4"  (1.626 m)  Weight: 131 lb (59.421 kg)   General appearance  Normal Skin grossly normal Head/Neck normal with no cervical or supraclavicular adenopathy thyroid normal Lungs  clear Cardiac pulse 62 with premature beats noted, without RMG Abdominal  soft, nontender, without masses, organomegaly or hernia Breasts  examined lying and sitting without masses, retractions, discharge or axillary adenopathy. Pelvic  Ext/BUS/vagina  atrophic changes. Left labia minora larger than right as always has been. Varicosity bulge left lower labia majora  Cervix  atrophic  Uterus  axial, normal size, shape and contour, midline and mobile nontender   Adnexa  Without masses or tenderness    Anus and perineum  Normal   Rectovaginal  Normal sphincter tone without palpated masses or tenderness.    Assessment/Plan:  79 y.o. G1P2002 female for followup exam.   1. Atrophic genital changes/post menopausal. Patient without significant symptoms of hot flashes, night sweats, vaginal dryness. Is not sexually active. Has done no bleeding. Will monitor. Patient will call if any vaginal bleeding. 2. Varicosity left lower labia majora. Has been present since birth of first child. Remains unchanged and asymptomatic. We'll continue to follow. 3. Osteopenia. DEXA 06/2012 with T score -1.9. FRAX 13%/3.6%. Discussed increased fracture risk in recommendation to consider medication. Reviewed bisphosphonates with her and the risks to include  GERD, osteonecrosis of the jaw and atypical fractures. Patient had been counseled by Dr. Eda Sanker also. Patient is not interested in treatment at this point. She understands and accepts of fracture risk. Will repeat DEXA next year at two-year interval. Check vitamin D level today. Increased calcium vitamin D recommendations reviewed. 4. Mammography due now and patient will schedule. SBE monthly reviewed. 5. Pap smear 2012. No Pap smear done today. No history of abnormal Pap smears previously. Reviewed current screening guidelines and at this point we'll stop screening issues over the age of 62 and she is comfortable with this. 6. Colonoscopy 8-9 years ago. Recommended interval 10 years per her history. We'll talk to her primary about scheduling. 7. Health maintenance. Patient currently undergoing workup for slow/irregular heartbeat. No routine blood work done today other than vitamin D as this is all done through her primary physician's office. Followup one year, sooner as needed.   Note: This document was prepared with digital dictation and possible smart phrase technology. Any transcriptional errors that result from this process are unintentional.   Dara Lords MD, 12:01 PM 07/08/2013

## 2013-07-08 NOTE — Patient Instructions (Signed)
Follow-up in 1 year, sooner as needed. 

## 2013-07-09 ENCOUNTER — Encounter: Payer: Self-pay | Admitting: Cardiology

## 2013-07-09 ENCOUNTER — Other Ambulatory Visit: Payer: Self-pay | Admitting: Gynecology

## 2013-07-09 ENCOUNTER — Ambulatory Visit (INDEPENDENT_AMBULATORY_CARE_PROVIDER_SITE_OTHER): Payer: Medicare Other | Admitting: Cardiology

## 2013-07-09 VITALS — BP 120/76 | HR 61 | Ht 64.0 in | Wt 128.0 lb

## 2013-07-09 DIAGNOSIS — I4949 Other premature depolarization: Secondary | ICD-10-CM | POA: Diagnosis not present

## 2013-07-09 DIAGNOSIS — Z9889 Other specified postprocedural states: Secondary | ICD-10-CM

## 2013-07-09 DIAGNOSIS — E559 Vitamin D deficiency, unspecified: Secondary | ICD-10-CM

## 2013-07-09 DIAGNOSIS — I493 Ventricular premature depolarization: Secondary | ICD-10-CM

## 2013-07-09 DIAGNOSIS — I129 Hypertensive chronic kidney disease with stage 1 through stage 4 chronic kidney disease, or unspecified chronic kidney disease: Secondary | ICD-10-CM | POA: Diagnosis not present

## 2013-07-09 DIAGNOSIS — R001 Bradycardia, unspecified: Secondary | ICD-10-CM

## 2013-07-09 DIAGNOSIS — I498 Other specified cardiac arrhythmias: Secondary | ICD-10-CM | POA: Diagnosis not present

## 2013-07-09 DIAGNOSIS — Z79899 Other long term (current) drug therapy: Secondary | ICD-10-CM | POA: Diagnosis not present

## 2013-07-09 LAB — URINALYSIS W MICROSCOPIC + REFLEX CULTURE
Bacteria, UA: NONE SEEN
Casts: NONE SEEN
Glucose, UA: NEGATIVE mg/dL
Ketones, ur: NEGATIVE mg/dL
Leukocytes, UA: NEGATIVE
Nitrite: NEGATIVE
Protein, ur: NEGATIVE mg/dL
Urobilinogen, UA: 0.2 mg/dL (ref 0.0–1.0)
pH: 6 (ref 5.0–8.0)

## 2013-07-09 LAB — VITAMIN D 25 HYDROXY (VIT D DEFICIENCY, FRACTURES): Vit D, 25-Hydroxy: 27 ng/mL — ABNORMAL LOW (ref 30–89)

## 2013-07-09 NOTE — Patient Instructions (Signed)
Your physician recommends that you schedule a follow-up appointment in: 3-6 MONTHS WITH DR. Graciela Husbands  Your physician recommends that you continue on your current medications as directed. Please refer to the Current Medication list given to you today.

## 2013-07-09 NOTE — Progress Notes (Signed)
ELECTROPHYSIOLOGY OFFICE NOTE  Patient ID: Michelle Winters MRN: [id number], DOB/AGE: [birth year]   Date of Visit: 07/09/2013  Primary Physician: Pete Glatter, MD Primary EP: Graciela Husbands, MD  Reason for Visit: Hospital follow-up  History of Present Illness  Michelle Winters is a 79 y.o. female with symptomatic PVCs who presents today for hospital followup. She underwent a nuclear stress test in Nov 2014 which was abnormal. She then underwent a cardiac cath last Thursday 07/01/2013. This revealed widely patent coronary arteries. She has normal LV function.   Since last being seen in our clinic, she reports she is doing well and has no complaints. Her groin site has healed well. She denies fatigue and states, "I'm really quite active still for someone my age." She continues to work 2 days per week and is the primary caregiver for her husband. She has chronic skipping palpitations which she tells me are no worse than her baseline. She tends to notice them more at night when lying in bed before sleep. Her palpitations do not interfere with her activities. She denies chest pain or shortness of breath. She denies dizziness, near syncope or syncope. She denies LE swelling, orthopnea or PND.  Past Medical History Past Medical History  Diagnosis Date  . Thyroid disease   . Heart palpitations   . Hypertension   . Bradycardia   . Osteopenia 06/2012 T score -1.9 FRAX    13%/3.6%    Past Surgical History Past Surgical History  Procedure Laterality Date  . Cholecystectomy    . Dilation and curettage of uterus    . Hysteroscopy    . Tonsillectomy    . Cardiac catheterization      Allergies/Intolerances Allergies  Allergen Reactions  . Lisinopril Other (See Comments)    Chest tightness  . Simvastatin Other (See Comments)    Muscle pain  . Welchol [Colesevelam Hcl] Other (See Comments)    Muscle pain   . Zetia [Ezetimibe] Nausea Only    Current Home Medications Current Outpatient Prescriptions    Medication Sig Dispense Refill  . aspirin 81 MG tablet Take 81 mg by mouth daily.      . Cholecalciferol (VITAMIN D-3 PO) Take 1,000 mg by mouth daily.      . Cyanocobalamin (VITAMIN B-12 IJ) Inject as directed. Monthly      . hydrochlorothiazide (HYDRODIURIL) 25 MG tablet Take 12.5 mg by mouth daily.       [name] levothyroxine (SYNTHROID, LEVOTHROID) 50 MCG tablet Take 50 mcg by mouth daily.       [name] loratadine (CLARITIN) 10 MG tablet Take 10 mg by mouth daily as needed for allergies.       [name] losartan (COZAAR) 25 MG tablet Take 25 mg by mouth daily.      . sodium chloride (OCEAN) 0.65 % nasal spray Place 2 sprays into the nose daily as needed for congestion.       No current facility-administered medications for this visit.    Social History Social History  . Marital Status: Married   Social History Main Topics  . Smoking status: Never Smoker   . Smokeless tobacco: Not on file  . Alcohol Use: No  . Drug Use: No   Review of Systems General: No chills, fever, night sweats or weight changes Cardiovascular: No chest pain, dyspnea on exertion, edema, orthopnea, palpitations, paroxysmal nocturnal dyspnea Dermatological: No rash, lesions or masses Respiratory: No cough, dyspnea Urologic: No hematuria, dysuria Abdominal: No nausea, vomiting, diarrhea, bright red blood per  rectum, melena, or hematemesis Neurologic: No visual changes, weakness, changes in mental status All other systems reviewed and are otherwise negative except as noted above.  Physical Exam Vitals: Blood pressure 120/76, pulse 61, height 5\' 4"  (1.626 m), weight 128 lb (58.06 kg), SpO2 98.00%.  General: Well developed, well appearing 79 y.o. female in no acute distress. HEENT: Normocephalic, atraumatic. EOMs intact. Sclera nonicteric. Oropharynx clear.  Neck: Supple without bruits. No JVD. Lungs: Respirations regular and unlabored, CTA bilaterally. No wheezes, rales or rhonchi. Heart: RRR. S1, S2 present. No murmurs, rub,  S3 or S4. Abdomen: Soft, non-tender, non-distended. BS present x 4 quadrants. No hepatosplenomegaly.  Extremities: No clubbing, cyanosis or edema. DP/PT/Radials 2+ and equal bilaterally. Right groin site intact and well healed without hematoma. Psych: Normal affect. Neuro: Alert and oriented X 3. Moves all extremities spontaneously.   Diagnostics Cardiac catheterization 07/01/2013 Procedural Findings:  Hemodynamics:  AO 132/56  LV 139/4  Coronary angiography:  Coronary dominance: right  Left mainstem: short segment, no obstructive disease.  Left anterior descending (LAD): Widely patent to the LV apex. The diagonal branches are small. There is no obstructive disease noted.  Left circumflex (LCx): Dominant vessel, normal in appearance. The OM and left PLA/PDA branches are patent  Right coronary artery (RCA): small, nondominant vessel. Catheter-induced spasm at the ostium.  Left ventriculography: Left ventricular systolic function is low-normal, LVEF is estimated at 50-55%, there is no significant mitral regurgitation  Final Conclusions:  1. Widely patent coronary arteries  2. Low normal LV function with normal LVEDP  Assessment and Plan 1. Symptomatic PVCs - stable; symptoms are unchanged and do not interfere with activities - bradycardia has precluded medical therapy previously (Ms. Shawgo declined ECG today) - recent cardiac cath revealed widely patent coronary arteries and normal LVEF - groin site well healed and can resume usual activities  - return for follow-up with Dr. Graciela Husbands in 3 months  Signed, Rick Duff, PA-C 07/09/2013, 12:51 PM

## 2013-07-09 NOTE — Telephone Encounter (Signed)
Pt to see Graciela Husbands 10/18/13 at 1200, Pt f/u with Rick Duff today

## 2013-07-13 ENCOUNTER — Encounter: Payer: Self-pay | Admitting: Obstetrics and Gynecology

## 2013-08-20 DIAGNOSIS — E538 Deficiency of other specified B group vitamins: Secondary | ICD-10-CM | POA: Diagnosis not present

## 2013-10-01 DIAGNOSIS — I10 Essential (primary) hypertension: Secondary | ICD-10-CM | POA: Diagnosis not present

## 2013-10-08 DIAGNOSIS — E538 Deficiency of other specified B group vitamins: Secondary | ICD-10-CM | POA: Diagnosis not present

## 2013-10-18 ENCOUNTER — Ambulatory Visit: Payer: Medicare Other | Admitting: Internal Medicine

## 2013-10-21 DIAGNOSIS — J00 Acute nasopharyngitis [common cold]: Secondary | ICD-10-CM | POA: Diagnosis not present

## 2013-10-21 DIAGNOSIS — H6120 Impacted cerumen, unspecified ear: Secondary | ICD-10-CM | POA: Diagnosis not present

## 2013-10-21 DIAGNOSIS — R42 Dizziness and giddiness: Secondary | ICD-10-CM | POA: Diagnosis not present

## 2013-10-28 DIAGNOSIS — H6120 Impacted cerumen, unspecified ear: Secondary | ICD-10-CM | POA: Diagnosis not present

## 2013-11-11 DIAGNOSIS — E538 Deficiency of other specified B group vitamins: Secondary | ICD-10-CM | POA: Diagnosis not present

## 2013-11-29 DIAGNOSIS — I10 Essential (primary) hypertension: Secondary | ICD-10-CM | POA: Diagnosis not present

## 2013-11-29 DIAGNOSIS — R55 Syncope and collapse: Secondary | ICD-10-CM | POA: Diagnosis not present

## 2013-12-16 ENCOUNTER — Encounter: Payer: Self-pay | Admitting: Cardiology

## 2013-12-16 ENCOUNTER — Ambulatory Visit (INDEPENDENT_AMBULATORY_CARE_PROVIDER_SITE_OTHER): Payer: Medicare Other | Admitting: Cardiology

## 2013-12-16 VITALS — BP 118/64 | HR 60 | Ht 64.0 in | Wt 132.0 lb

## 2013-12-16 DIAGNOSIS — I4949 Other premature depolarization: Secondary | ICD-10-CM

## 2013-12-16 DIAGNOSIS — R55 Syncope and collapse: Secondary | ICD-10-CM | POA: Diagnosis not present

## 2013-12-16 DIAGNOSIS — I498 Other specified cardiac arrhythmias: Secondary | ICD-10-CM | POA: Diagnosis not present

## 2013-12-16 DIAGNOSIS — I493 Ventricular premature depolarization: Secondary | ICD-10-CM

## 2013-12-16 DIAGNOSIS — I10 Essential (primary) hypertension: Secondary | ICD-10-CM | POA: Diagnosis not present

## 2013-12-16 DIAGNOSIS — R9439 Abnormal result of other cardiovascular function study: Secondary | ICD-10-CM

## 2013-12-16 DIAGNOSIS — R931 Abnormal findings on diagnostic imaging of heart and coronary circulation: Secondary | ICD-10-CM

## 2013-12-16 DIAGNOSIS — R001 Bradycardia, unspecified: Secondary | ICD-10-CM

## 2013-12-16 NOTE — Patient Instructions (Signed)
Your physician recommends that you continue on your current medications as directed. Please refer to the Current Medication list given to you today.  Your physician wants you to follow-up in: 6 months with Dr Turner You will receive a reminder letter in the mail two months in advance. If you don't receive a letter, please call our office to schedule the follow-up appointment.  

## 2013-12-16 NOTE — Progress Notes (Signed)
[place], [place]  [address] Phone: [phone] Fax:  [phone]  Date:  12/16/2013   ID:  Michelle Winters, DOB Dec 31, 1931, MRN [id number]  PCP:  Mathews Argyle, MD  Cardiologist:  Fransico Him, MD     History of Present Illness: Michelle Winters is a 80 y.o. female with a history of HTN, normal coronary arteries on cath, symptomatic PVC's of a RBBB morphology and bradycardia who presents today for evaluation.  She was seen by Dr. Caryl Comes in the fall for workup of bradycardia and PVC's. She underwent nuclear stress test was abnormal and she underwent cardiac cath which showed normal coronary arteries.  She was seen back by Dr. Olin Pia PA and was supposed to followup with him in 3 months.  She then decided that she did not want to see Dr. Caryl Comes again.  She is here now because she passed out on February 28th while out working hard in the yard and had just gotten over a sinus infection.  She says that she had been having some lightheadedness and the day she passed she became dizzy and sat down and then awakened lying flat on her back on the bank.  She denied any diaphoresis, nausea or palpitations prior to the event.  She has not had any further syncope since then but has had some mild lightheadedness.  She says that her HR that am prior to the event on her BP cuff was 42 and BP 123/20mmHg.  Later that day HR was 60bpm and BP 140/68mmHg.     Wt Readings from Last 3 Encounters:  12/16/13 132 lb (59.875 kg)  07/09/13 128 lb (58.06 kg)  07/08/13 131 lb (59.421 kg)     Past Medical History  Diagnosis Date  . Thyroid disease   . Heart palpitations   . Hypertension   . Bradycardia   . Osteopenia 06/2012 T score -1.9 FRAX    13%/3.6%  . Abnormal nuclear cardiac imaging test     normal coronary arteries by cath 2014    Current Outpatient Prescriptions  Medication Sig Dispense Refill  . aspirin 81 MG tablet Take 81 mg by mouth daily.      . Cyanocobalamin  (VITAMIN B-12 IJ) Inject as directed. Monthly      . hydrochlorothiazide (HYDRODIURIL) 25 MG tablet Take 12.5 mg by mouth daily.       [name] levothyroxine (SYNTHROID, LEVOTHROID) 50 MCG tablet Take 50 mcg by mouth daily.       [name] loratadine (CLARITIN) 10 MG tablet Take 10 mg by mouth daily as needed for allergies.       [name] losartan (COZAAR) 25 MG tablet Take 25 mg by mouth daily.      . sodium chloride (OCEAN) 0.65 % nasal spray Place 2 sprays into the nose daily as needed for congestion.       No current facility-administered medications for this visit.    Allergies:    Allergies  Allergen Reactions  . Lisinopril Other (See Comments)    Chest tightness  . Simvastatin Other (See Comments)    Muscle pain  . Welchol [Colesevelam Hcl] Other (See Comments)    Muscle pain   . Zetia [Ezetimibe] Nausea Only    Social History:  The patient  reports that she has never smoked. She does not have any smokeless tobacco history on file. She reports that she does not drink alcohol or use illicit drugs.   Family History:  The patient's  family history includes Cancer in her sister; Diabetes in her sister; Heart attack in her father; Heart failure in her brother; Multiple sclerosis in her mother.   ROS:  Please see the history of present illness.      All other systems reviewed and negative.   PHYSICAL EXAM: VS:  BP 118/64  Pulse 60  Ht 5\' 4"  (1.626 m)  Wt 132 lb (59.875 kg)  BMI 22.65 kg/m2 Well nourished, well developed, in no acute distress HEENT: normal Neck: no JVD Cardiac:  normal S1, S2; RRR and bradycardic; frequent ectopy Lungs:  clear to auscultation bilaterally, no wheezing, rhonchi or rales Abd: soft, nontender, no hepatomegaly Ext: no edema Skin: warm and dry Neuro:  CNs 2-12 intact, no focal abnormalities noted  EKG:     NSR at 67bpm with PVC's and pause up to at least 1.6 sec pause but runs off end of EKG so cannot tell actual length  ASSESSMENT AND PLAN:  1.  Syncope ?  Etiology - ? Orthostatic vs. Bradyarrhythmia.  Recent cardiac workup showed normal coronary arteries and echo showed mild HK basal inferior myocardium.  She brought her BP and HR readings in today and the HR ranges from  42-62bpm with most readings in the low 40's to low 50's.  BP ranges from 84-154/50-60's.  I am concerned that she may have had a bradyarrhythmia that caused her syncope.  At this time I think we should consider a loop recorder.  I have recommended that we send her back to see EP but she is adamant that she is not going to see anyone and she said "no one is going to order any more tests on me".  She was angry in the office and feels like she is getting the run around and no one has done anything to address her bradycardia.  I tried to explain the reasoning for the the tests but she remained upset.  I have spoken with Dr. Felipa Eth and told him my recommendations to refer to Dr. Lovena Le  for loop recorder.  She refuses to set up an OV with Dr. Lovena Le at this time and said that she would talk with her family and then let us know what she decides.  I will see her back in 6 months  Signed, Fransico Him, MD 12/16/2013 1:54 PM

## 2013-12-17 DIAGNOSIS — E538 Deficiency of other specified B group vitamins: Secondary | ICD-10-CM | POA: Diagnosis not present

## 2013-12-28 ENCOUNTER — Telehealth: Payer: Self-pay | Admitting: *Deleted

## 2013-12-28 NOTE — Telephone Encounter (Signed)
Pt will have PCP recheck Vit D level and fax result to office for recheck.

## 2014-01-27 DIAGNOSIS — E538 Deficiency of other specified B group vitamins: Secondary | ICD-10-CM | POA: Diagnosis not present

## 2014-02-01 DIAGNOSIS — H43819 Vitreous degeneration, unspecified eye: Secondary | ICD-10-CM | POA: Diagnosis not present

## 2014-02-01 DIAGNOSIS — H43399 Other vitreous opacities, unspecified eye: Secondary | ICD-10-CM | POA: Diagnosis not present

## 2014-02-14 DIAGNOSIS — L909 Atrophic disorder of skin, unspecified: Secondary | ICD-10-CM | POA: Diagnosis not present

## 2014-02-14 DIAGNOSIS — L919 Hypertrophic disorder of the skin, unspecified: Secondary | ICD-10-CM | POA: Diagnosis not present

## 2014-02-14 DIAGNOSIS — Z1331 Encounter for screening for depression: Secondary | ICD-10-CM | POA: Diagnosis not present

## 2014-02-14 DIAGNOSIS — H35369 Drusen (degenerative) of macula, unspecified eye: Secondary | ICD-10-CM | POA: Diagnosis not present

## 2014-02-14 DIAGNOSIS — H43819 Vitreous degeneration, unspecified eye: Secondary | ICD-10-CM | POA: Diagnosis not present

## 2014-02-14 DIAGNOSIS — E039 Hypothyroidism, unspecified: Secondary | ICD-10-CM | POA: Diagnosis not present

## 2014-02-14 DIAGNOSIS — Z0000 Encounter for general adult medical examination without abnormal findings: Secondary | ICD-10-CM | POA: Diagnosis not present

## 2014-02-14 DIAGNOSIS — I10 Essential (primary) hypertension: Secondary | ICD-10-CM | POA: Diagnosis not present

## 2014-03-01 DIAGNOSIS — E538 Deficiency of other specified B group vitamins: Secondary | ICD-10-CM | POA: Diagnosis not present

## 2014-03-19 ENCOUNTER — Emergency Department (INDEPENDENT_AMBULATORY_CARE_PROVIDER_SITE_OTHER)
Admission: EM | Admit: 2014-03-19 | Discharge: 2014-03-19 | Disposition: A | Discharge status: Nursing Home (Includes ICF) | Payer: Medicare Other | Source: Home / Self Care | Attending: Family Medicine | Admitting: Family Medicine

## 2014-03-19 ENCOUNTER — Encounter (HOSPITAL_COMMUNITY): Payer: Self-pay | Admitting: Emergency Medicine

## 2014-03-19 ENCOUNTER — Inpatient Hospital Stay (HOSPITAL_COMMUNITY)
Admission: EM | Admit: 2014-03-19 | Discharge: 2014-03-21 | DRG: 310 | Disposition: A | Discharge status: Home / Self Care | Payer: Medicare Other | Attending: Internal Medicine | Admitting: Internal Medicine

## 2014-03-19 ENCOUNTER — Emergency Department (HOSPITAL_COMMUNITY): Payer: Medicare Other

## 2014-03-19 DIAGNOSIS — I495 Sick sinus syndrome: Secondary | ICD-10-CM | POA: Diagnosis present

## 2014-03-19 DIAGNOSIS — Z833 Family history of diabetes mellitus: Secondary | ICD-10-CM | POA: Diagnosis not present

## 2014-03-19 DIAGNOSIS — E039 Hypothyroidism, unspecified: Secondary | ICD-10-CM | POA: Diagnosis present

## 2014-03-19 DIAGNOSIS — R931 Abnormal findings on diagnostic imaging of heart and coronary circulation: Secondary | ICD-10-CM

## 2014-03-19 DIAGNOSIS — Z7982 Long term (current) use of aspirin: Secondary | ICD-10-CM

## 2014-03-19 DIAGNOSIS — E038 Other specified hypothyroidism: Secondary | ICD-10-CM | POA: Diagnosis not present

## 2014-03-19 DIAGNOSIS — R002 Palpitations: Secondary | ICD-10-CM | POA: Diagnosis present

## 2014-03-19 DIAGNOSIS — I10 Essential (primary) hypertension: Secondary | ICD-10-CM | POA: Diagnosis present

## 2014-03-19 DIAGNOSIS — Z8249 Family history of ischemic heart disease and other diseases of the circulatory system: Secondary | ICD-10-CM | POA: Diagnosis not present

## 2014-03-19 DIAGNOSIS — R5383 Other fatigue: Secondary | ICD-10-CM | POA: Diagnosis not present

## 2014-03-19 DIAGNOSIS — I4891 Unspecified atrial fibrillation: Secondary | ICD-10-CM | POA: Diagnosis present

## 2014-03-19 DIAGNOSIS — Z888 Allergy status to other drugs, medicaments and biological substances status: Secondary | ICD-10-CM

## 2014-03-19 DIAGNOSIS — R55 Syncope and collapse: Secondary | ICD-10-CM | POA: Diagnosis not present

## 2014-03-19 DIAGNOSIS — I498 Other specified cardiac arrhythmias: Secondary | ICD-10-CM | POA: Diagnosis not present

## 2014-03-19 DIAGNOSIS — M949 Disorder of cartilage, unspecified: Secondary | ICD-10-CM

## 2014-03-19 DIAGNOSIS — M899 Disorder of bone, unspecified: Secondary | ICD-10-CM | POA: Diagnosis present

## 2014-03-19 DIAGNOSIS — R001 Bradycardia, unspecified: Secondary | ICD-10-CM

## 2014-03-19 DIAGNOSIS — R5381 Other malaise: Secondary | ICD-10-CM | POA: Diagnosis not present

## 2014-03-19 DIAGNOSIS — I493 Ventricular premature depolarization: Secondary | ICD-10-CM | POA: Diagnosis present

## 2014-03-19 DIAGNOSIS — R42 Dizziness and giddiness: Secondary | ICD-10-CM | POA: Diagnosis not present

## 2014-03-19 LAB — URINALYSIS, ROUTINE W REFLEX MICROSCOPIC
Bilirubin Urine: NEGATIVE
Glucose, UA: NEGATIVE mg/dL
KETONES UR: NEGATIVE mg/dL
Leukocytes, UA: NEGATIVE
Nitrite: NEGATIVE
PROTEIN: NEGATIVE mg/dL
Specific Gravity, Urine: 1.007 (ref 1.005–1.030)
Urobilinogen, UA: 0.2 mg/dL (ref 0.0–1.0)
pH: 6 (ref 5.0–8.0)

## 2014-03-19 LAB — CBC WITH DIFFERENTIAL/PLATELET
BASOS ABS: 0 10*3/uL (ref 0.0–0.1)
Basophils Relative: 1 % (ref 0–1)
EOS ABS: 0.4 10*3/uL (ref 0.0–0.7)
Eosinophils Relative: 6 % — ABNORMAL HIGH (ref 0–5)
HEMATOCRIT: 36.3 % (ref 36.0–46.0)
HEMOGLOBIN: 12.1 g/dL (ref 12.0–15.0)
LYMPHS PCT: 24 % (ref 12–46)
Lymphs Abs: 1.3 10*3/uL (ref 0.7–4.0)
MCH: 29.7 pg (ref 26.0–34.0)
MCHC: 33.3 g/dL (ref 30.0–36.0)
MCV: 89 fL (ref 78.0–100.0)
Monocytes Absolute: 0.5 10*3/uL (ref 0.1–1.0)
Monocytes Relative: 9 % (ref 3–12)
NEUTROS ABS: 3.3 10*3/uL (ref 1.7–7.7)
Neutrophils Relative %: 60 % (ref 43–77)
Platelets: 176 10*3/uL (ref 150–400)
RBC: 4.08 MIL/uL (ref 3.87–5.11)
RDW: 12.5 % (ref 11.5–15.5)
WBC: 5.6 10*3/uL (ref 4.0–10.5)

## 2014-03-19 LAB — I-STAT TROPONIN, ED: TROPONIN I, POC: 0.01 ng/mL (ref 0.00–0.08)

## 2014-03-19 LAB — BASIC METABOLIC PANEL
ANION GAP: 12 (ref 5–15)
BUN: 19 mg/dL (ref 6–23)
CALCIUM: 8.9 mg/dL (ref 8.4–10.5)
CO2: 26 mEq/L (ref 19–32)
Chloride: 105 mEq/L (ref 96–112)
Creatinine, Ser: 0.84 mg/dL (ref 0.50–1.10)
GFR, EST AFRICAN AMERICAN: 74 mL/min — AB (ref >90)
GFR, EST NON AFRICAN AMERICAN: 64 mL/min — AB (ref >90)
Glucose, Bld: 95 mg/dL (ref 70–99)
POTASSIUM: 4.7 meq/L (ref 3.7–5.3)
SODIUM: 143 meq/L (ref 137–147)

## 2014-03-19 LAB — TSH: TSH: 7.22 u[IU]/mL — ABNORMAL HIGH (ref 0.350–4.500)

## 2014-03-19 LAB — URINE MICROSCOPIC-ADD ON

## 2014-03-19 LAB — MAGNESIUM: MAGNESIUM: 1.7 mg/dL (ref 1.5–2.5)

## 2014-03-19 MED ORDER — ALBUTEROL SULFATE (2.5 MG/3ML) 0.083% IN NEBU: 2.5000 mg | INHALATION_SOLUTION | RESPIRATORY_TRACT | Status: DC | PRN

## 2014-03-19 MED ORDER — ASPIRIN 81 MG PO TABS: 81.0000 mg | ORAL_TABLET | Freq: Every day | ORAL | Status: DC

## 2014-03-19 MED ORDER — ONDANSETRON HCL 4 MG PO TABS
4.0000 mg | ORAL_TABLET | Freq: Four times a day (QID) | ORAL | Status: DC | PRN
Start: 2014-03-19 — End: 2014-03-21

## 2014-03-19 MED ORDER — ONDANSETRON HCL 4 MG/2ML IJ SOLN: 4.0000 mg | Freq: Four times a day (QID) | INTRAMUSCULAR | Status: DC | PRN

## 2014-03-19 MED ORDER — SODIUM CHLORIDE 0.9 % IJ SOLN
3.0000 mL | Freq: Two times a day (BID) | INTRAMUSCULAR | Status: DC
  Administered 2014-03-20 – 2014-03-21 (×4): 3 mL via INTRAVENOUS

## 2014-03-19 MED ORDER — DOCUSATE SODIUM 100 MG PO CAPS
100.0000 mg | ORAL_CAPSULE | Freq: Two times a day (BID) | ORAL | Status: DC
  Filled 2014-03-19 (×6): qty 1

## 2014-03-19 MED ORDER — ASPIRIN 81 MG PO CHEW
81.0000 mg | CHEWABLE_TABLET | Freq: Every day | ORAL | Status: DC
  Administered 2014-03-20 – 2014-03-21 (×2): 81 mg via ORAL
  Filled 2014-03-19 (×3): qty 1

## 2014-03-19 MED ORDER — LOSARTAN POTASSIUM 25 MG PO TABS
25.0000 mg | ORAL_TABLET | Freq: Every day | ORAL | Status: DC
  Administered 2014-03-20 – 2014-03-21 (×2): 25 mg via ORAL
  Filled 2014-03-19 (×2): qty 1

## 2014-03-19 MED ORDER — LEVOTHYROXINE SODIUM 50 MCG PO TABS
50.0000 ug | ORAL_TABLET | Freq: Every day | ORAL | Status: DC
  Filled 2014-03-19: qty 1

## 2014-03-19 MED ORDER — SODIUM CHLORIDE 0.9 % IV SOLN
INTRAVENOUS | Status: AC
  Administered 2014-03-20: via INTRAVENOUS

## 2014-03-19 MED ORDER — ACETAMINOPHEN 650 MG RE SUPP: 650.0000 mg | Freq: Four times a day (QID) | RECTAL | Status: DC | PRN

## 2014-03-19 MED ORDER — ACETAMINOPHEN 325 MG PO TABS: 650.0000 mg | ORAL_TABLET | Freq: Four times a day (QID) | ORAL | Status: DC | PRN

## 2014-03-19 NOTE — ED Notes (Signed)
[facility] presents with a 80 yo female from urgent care with dizziness and heart palpitations.Dizziness has been for one week. Episodes intermittent.  Today episodes include burning in upper chest more frequent and intense.

## 2014-03-19 NOTE — ED Notes (Signed)
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

## 2014-03-19 NOTE — ED Notes (Signed)
Dizziness for one week, intermittent episodes.  Today episodes have been more significant in frequency and intensity.  Today has associated burning in upper chest with episodes and felt heart pounding.  Patient also relates episodes to looking upward.

## 2014-03-19 NOTE — ED Provider Notes (Signed)
CSN: [id number]     Arrival date & time 03/19/14  1622 History   First MD Initiated Contact with Patient 03/19/14 1643     Chief Complaint  Patient presents with  . Dizziness   (Consider location/radiation/quality/duration/timing/severity/associated sxs/prior Treatment) Patient is a 80 y.o. female presenting with dizziness. The history is provided by the patient and a relative.  Dizziness Quality:  Lightheadedness Severity:  Moderate Onset quality:  Sudden Duration:  8 days Timing:  Intermittent Progression:  Partially resolved Chronicity:  Recurrent Context comment:  Dizzy all week, h/o irreg heartbeat, today palp  and burning in upper chest, 2 episodes of near syncope at work. worse when up moving around, also recent fatigue . better with sitting still. Associated symptoms: palpitations   Associated symptoms: no chest pain     Past Medical History  Diagnosis Date  . Thyroid disease   . Heart palpitations   . Hypertension   . Bradycardia   . Osteopenia 06/2012 T score -1.9 FRAX    13%/3.6%  . Abnormal nuclear cardiac imaging test     normal coronary arteries by cath 2014   Past Surgical History  Procedure Laterality Date  . Cholecystectomy    . Dilation and curettage of uterus    . Hysteroscopy    . Tonsillectomy    . Cardiac catheterization      normal coronary arteries   Family History  Problem Relation Age of Onset  . Multiple sclerosis Mother   . Heart attack Father   . Cancer Sister     Skin cancer  . Diabetes Sister   . Heart failure Brother    History  Substance Use Topics  . Smoking status: Never Smoker   . Smokeless tobacco: Not on file  . Alcohol Use: No   OB History   Grav Para Term Preterm Abortions TAB SAB Ect Mult Living   2 2 2       2      Review of Systems  Constitutional: Negative.   HENT: Negative.   Respiratory: Negative.   Cardiovascular: Positive for palpitations. Negative for chest pain and leg swelling.  Gastrointestinal:  Negative.   Neurological: Positive for dizziness, syncope and light-headedness.    Allergies  Lisinopril; Simvastatin; Welchol; and Zetia  Home Medications   Prior to Admission medications   Medication Sig Start Date End Date Taking? Authorizing Provider  aspirin 81 MG tablet Take 81 mg by mouth daily.    Historical Provider, MD  Cyanocobalamin (VITAMIN B-12 IJ) Inject as directed. Monthly    Historical Provider, MD  hydrochlorothiazide (HYDRODIURIL) 25 MG tablet Take 12.5 mg by mouth daily.     Historical Provider, MD  levothyroxine (SYNTHROID, LEVOTHROID) 50 MCG tablet Take 50 mcg by mouth daily.     Historical Provider, MD  loratadine (CLARITIN) 10 MG tablet Take 10 mg by mouth daily as needed for allergies.     Historical Provider, MD  losartan (COZAAR) 25 MG tablet Take 25 mg by mouth daily.    Historical Provider, MD  sodium chloride (OCEAN) 0.65 % nasal spray Place 2 sprays into the nose daily as needed for congestion.    Historical Provider, MD   BP 112/62  Pulse 44  Temp(Src) 98.3 F (36.8 C) (Oral)  SpO2 97% Physical Exam  Nursing note and vitals reviewed. Constitutional: She appears well-developed and well-nourished.  HENT:  Right Ear: External ear normal.  Left Ear: External ear normal.  Eyes: Conjunctivae and EOM are normal. Pupils are equal,  round, and reactive to light.  Neck: Normal range of motion. Neck supple. No thyromegaly present.  Cardiovascular: Intact distal pulses.  An irregular rhythm present. Frequent extrasystoles are present.  No murmur heard. Pulmonary/Chest: Effort normal and breath sounds normal.  Abdominal: Soft. Bowel sounds are normal. She exhibits no distension. There is no tenderness.  Musculoskeletal: She exhibits no edema.  Lymphadenopathy:    She has no cervical adenopathy.  Skin: Skin is warm.    ED Course  Procedures (including critical care time) Labs Review Labs Reviewed - No data to display  Imaging Review No results  found.   MDM   1. Heart palpitations   2. Near syncope    Sent for palpitations and near syncope today at work. abnl ecg.   Billy Fischer, MD 03/19/14 [id number]

## 2014-03-19 NOTE — H&P (Signed)
[facility] History and Physical  Michelle Winters UVO:[id number] DOB: Jan 08, 1932 DOA: 03/19/2014   PCP: Mathews Argyle, MD  Specialists: She has been seen by Dr. Fransico Him, with cardiology in the past  Chief Complaint: Lightheadedness, with palpitations  HPI: Michelle Winters is a 80 y.o. female with a past medical history of hypothyroidism, hypertension, who has had syncopal episodes in past and has had cardiac workup for the same and who presents to the hospital with lightheadedness on and off for the last one week. These episodes last a few seconds. And, then since yesterday she has noticed fluttering in her chest. And these feelings of lightheadedness have gotten worse. However, hasn't had any syncopal episodes. These events occur mainly when she is standing up at work or reaching up to pick up something. She denies any chest pain or tightness. No difficulty breathing. She did have some burning sensation in her neck area earlier today, but none currently. Denies any fever or chills recently. No nausea, vomiting, or diarrhea recently. Denies any other illness recently. She was offered a loop recorder during her prior evaluation, and she had declined it at that time. She has had a cardiac catheterization in 2014 along with echocardiogram and stress testing. She was not noted to have any coronary artery disease. Systolic function was normal. There was a mention of grade 1 diastolic dysfunction.  Home Medications: Prior to Admission medications   Medication Sig Start Date End Date Taking? Authorizing Provider  aspirin 81 MG tablet Take 81 mg by mouth daily.   Yes Historical Provider, MD  Cyanocobalamin (VITAMIN B-12 IJ) Inject 1 inch as directed. Monthly   Yes Historical Provider, MD  fluticasone (FLONASE) 50 MCG/ACT nasal spray Place 1 spray into both nostrils daily.   Yes Historical Provider, MD  hydrochlorothiazide (HYDRODIURIL) 25 MG tablet Take 12.5 mg by mouth daily.    Yes  Historical Provider, MD  levothyroxine (SYNTHROID, LEVOTHROID) 50 MCG tablet Take 50 mcg by mouth daily.    Yes Historical Provider, MD  loratadine (CLARITIN) 10 MG tablet Take 10 mg by mouth daily as needed for allergies.    Yes Historical Provider, MD  losartan (COZAAR) 25 MG tablet Take 25 mg by mouth daily.   Yes Historical Provider, MD    Allergies:  Allergies  Allergen Reactions  . Lisinopril Other (See Comments)    Chest tightness  . Simvastatin Other (See Comments)    Muscle pain  . Welchol [Colesevelam Hcl] Other (See Comments)    Muscle pain   . Zetia [Ezetimibe] Nausea Only    Past Medical History: Past Medical History  Diagnosis Date  . Thyroid disease   . Heart palpitations   . Hypertension   . Bradycardia   . Osteopenia 06/2012 T score -1.9 FRAX    13%/3.6%  . Abnormal nuclear cardiac imaging test     normal coronary arteries by cath 2014    Past Surgical History  Procedure Laterality Date  . Cholecystectomy    . Dilation and curettage of uterus    . Hysteroscopy    . Tonsillectomy    . Cardiac catheterization      normal coronary arteries    Social History: She [place] in [place]. She cares for her husband, who has chronic medical problem. She also works part-time in a [employer]. She denies smoking, alcohol use or illicit drug use. Very independent with daily activities.  Family History:  Family History  Problem Relation Age of Onset  .  Multiple sclerosis Mother   . Heart attack Father   . Cancer Sister     Skin cancer  . Diabetes Sister   . Heart failure Brother      Review of Systems - History obtained from the patient General ROS: negative Psychological ROS: negative Ophthalmic ROS: recent floaters in eye for which she was seen by her ophthalmologist. ENT ROS: negative Allergy and Immunology ROS: negative Hematological and Lymphatic ROS: negative Endocrine ROS: negative Respiratory ROS: as in hpi Cardiovascular ROS: as in  hpi Gastrointestinal ROS: no abdominal pain, change in bowel habits, or black or bloody stools Genito-Urinary ROS: no dysuria, trouble voiding, or hematuria Musculoskeletal ROS: negative Neurological ROS: no TIA or stroke symptoms Dermatological ROS: negative  Physical Examination  Filed Vitals:   03/19/14 1900 03/19/14 1915 03/19/14 1930 03/19/14 2046  BP: 139/82 166/64 144/66 150/75  Pulse: 62 54 63 66  Temp:      TempSrc:      Resp: '18 11 14 16  ' SpO2: 100% 100% 100% 100%    BP 150/75  Pulse 66  Temp(Src) 97.8 F (36.6 C) (Oral)  Resp 16  SpO2 100%  General appearance: alert, cooperative, appears stated age and no distress Head: Normocephalic, without obvious abnormality, atraumatic Eyes: conjunctivae/corneas clear. PERRL, EOM's intact. Throat: lips, mucosa, and tongue normal; teeth and gums normal Neck: no adenopathy, no carotid bruit, no JVD, supple, symmetrical, trachea midline and thyroid not enlarged, symmetric, no tenderness/mass/nodules Resp: clear to auscultation bilaterally Cardio: S1-S2 is normal without premature beats. No S3, S4. No rubs, or bruit. Possible systolic murmur over the apex. No pedal edema GI: soft, non-tender; bowel sounds normal; no masses,  no organomegaly Extremities: extremities normal, atraumatic, no cyanosis or edema Pulses: 2+ and symmetric Skin: Skin color, texture, turgor normal. No rashes or lesions Lymph nodes: Cervical, supraclavicular, and axillary nodes normal. Neurologic: Alert and oriented x3. No focal neurological deficits are noted.  Laboratory Data: Results for orders placed during the hospital encounter of 03/19/14 (from the past 48 hour(s))  CBC WITH DIFFERENTIAL     Status: Abnormal   Collection Time    03/19/14  6:40 PM      Result Value Ref Range   WBC 5.6  4.0 - 10.5 K/uL   RBC 4.08  3.87 - 5.11 MIL/uL   Hemoglobin 12.1  12.0 - 15.0 g/dL   HCT 36.3  36.0 - 46.0 %   MCV 89.0  78.0 - 100.0 fL   MCH 29.7  26.0 - 34.0  pg   MCHC 33.3  30.0 - 36.0 g/dL   RDW 12.5  11.5 - 15.5 %   Platelets 176  150 - 400 K/uL   Neutrophils Relative % 60  43 - 77 %   Neutro Abs 3.3  1.7 - 7.7 K/uL   Lymphocytes Relative 24  12 - 46 %   Lymphs Abs 1.3  0.7 - 4.0 K/uL   Monocytes Relative 9  3 - 12 %   Monocytes Absolute 0.5  0.1 - 1.0 K/uL   Eosinophils Relative 6 (*) 0 - 5 %   Eosinophils Absolute 0.4  0.0 - 0.7 K/uL   Basophils Relative 1  0 - 1 %   Basophils Absolute 0.0  0.0 - 0.1 K/uL  BASIC METABOLIC PANEL     Status: Abnormal   Collection Time    03/19/14  6:40 PM      Result Value Ref Range   Sodium 143  137 - 147  mEq/L   Potassium 4.7  3.7 - 5.3 mEq/L   Comment: HEMOLYSIS AT THIS LEVEL MAY AFFECT RESULT   Chloride 105  96 - 112 mEq/L   CO2 26  19 - 32 mEq/L   Glucose, Bld 95  70 - 99 mg/dL   BUN 19  6 - 23 mg/dL   Creatinine, Ser 0.84  0.50 - 1.10 mg/dL   Calcium 8.9  8.4 - 10.5 mg/dL   GFR calc non Af Amer 64 (*) >90 mL/min   GFR calc Af Amer 74 (*) >90 mL/min   Comment: (NOTE)     The eGFR has been calculated using the CKD EPI equation.     This calculation has not been validated in all clinical situations.     eGFR's persistently <90 mL/min signify possible Chronic Kidney     Disease.   Anion gap 12  5 - 15  URINALYSIS, ROUTINE W REFLEX MICROSCOPIC     Status: Abnormal   Collection Time    03/19/14  6:51 PM      Result Value Ref Range   Color, Urine YELLOW  YELLOW   APPearance CLEAR  CLEAR   Specific Gravity, Urine 1.007  1.005 - 1.030   pH 6.0  5.0 - 8.0   Glucose, UA NEGATIVE  NEGATIVE mg/dL   Hgb urine dipstick MODERATE (*) NEGATIVE   Bilirubin Urine NEGATIVE  NEGATIVE   Ketones, ur NEGATIVE  NEGATIVE mg/dL   Protein, ur NEGATIVE  NEGATIVE mg/dL   Urobilinogen, UA 0.2  0.0 - 1.0 mg/dL   Nitrite NEGATIVE  NEGATIVE   Leukocytes, UA NEGATIVE  NEGATIVE  URINE MICROSCOPIC-ADD ON     Status: None   Collection Time    03/19/14  6:51 PM      Result Value Ref Range   Squamous Epithelial /  LPF RARE  RARE   WBC, UA 0-2  <3 WBC/hpf   RBC / HPF 3-6  <3 RBC/hpf   Bacteria, UA RARE  RARE  I-STAT TROPOININ, ED     Status: None   Collection Time    03/19/14  6:54 PM      Result Value Ref Range   Troponin i, poc 0.01  0.00 - 0.08 ng/mL   Comment 3            Comment: Due to the release kinetics of cTnI,     a negative result within the first hours     of the onset of symptoms does not rule out     myocardial infarction with certainty.     If myocardial infarction is still suspected,     repeat the test at appropriate intervals.    Radiology Reports: Dg Chest 2 View  03/19/2014   CLINICAL DATA:  Near syncope.  Weakness.  EXAM: CHEST  2 VIEW  COMPARISON:  02/21/2010  FINDINGS: The heart size and mediastinal contours are within normal limits. Chronic interstitial coarsening noted. No airspace consolidation. The visualized skeletal structures are unremarkable.  IMPRESSION: No active cardiopulmonary disease.   Electronically Signed   By: Kerby Moors M.D.   On: 03/19/2014 20:26    Electrocardiogram: 2 EKGs are available. Sinus rhythm 86 beats per minute. One of them shows almost a ventricular bigeminy pattern. Nonspecific ST and T wave changes.  Problem List  Principal Problem:   Near syncope Active Problems:   Hypertension   PVC (premature ventricular contraction)   Palpitations   Hypothyroidism   Assessment: This is 80 year old Caucasian  female, who presents with lightheadedness and palpitations. She's had a few near syncopal episodes. Etiology remains unclear but appears to be cardiac/arrythmias. Doesn't have any focal neurological deficits.  Plan: #1 near-syncope with palpitations and PVCs: Electrolytes are normal. We will check magnesium. We will monitor her on telemetry. She had an echocardiogram back in October of last year. I don't see any utility in repeating another one at this time. However I discussed with cardiology as the patient is now agreeable to a loop  recorder. They will arrange for this in the morning. We will check serial troponin levels. Will check orthostatics. Keep her on aspirin for now. Check TSH and free T4.  #2 history of hypothyroidism: Continue with her home medications.   #3 history of hypertension: Continue with her ARB. Hold her diuretic for tonight.   DVT Prophylaxis: SCDs Code Status: Full code Family Communication: Discussed with the patient and her daughter-in-law  Disposition Plan: Observe the telemetry   Further management decisions will depend on results of further testing and patient's response to treatment.   [employer]  [facility] Pager [phone]  If 7PM-7AM, please contact night-coverage [email or web address] Password [facility]  03/19/2014, 9:14 PM  Disclaimer: This note was dictated with voice recognition software. Similar sounding words can inadvertently be transcribed and may not be corrected upon review.

## 2014-03-19 NOTE — ED Notes (Signed)
carelink notified 

## 2014-03-19 NOTE — ED Notes (Signed)
Dr. Krishnan at bedside. 

## 2014-03-19 NOTE — ED Provider Notes (Signed)
CSN: [id number]     Arrival date & time 03/19/14  1740 History   First MD Initiated Contact with Patient 03/19/14 1751     Chief Complaint  Patient presents with  . Dizziness  . Palpitations     (Consider location/radiation/quality/duration/timing/severity/associated sxs/prior Treatment) HPI Comments: Patient presents with dizziness. She states over the last week she's had several episodes of feeling lightheaded. She's had a grab onto something and her head down to keep from passing out. She states this has happened several times over last week but was more frequent and intense today. She had a near-syncopal episode today with one of the episodes. She also noticed some locations today with some associated burning in her chest during one of the episodes. She states the lightheadedness only lasts about a minute and then she feels better. She has had some increased fatigue over the last few days. She denies any other chest pain or tightness. She denies a shortness of breath. She denies any neurologic deficits. She has no vision changes or speech deficits. She denies any fevers cough cold or recent illnesses. She has requested about a year ago and had a catheterization that was normal per report. She was seeing Dr. Fransico Him at the time who had also recommended implantation of a loop recorder but she did not have this done.  Patient is a 80 y.o. female presenting with dizziness and palpitations.  Dizziness Associated symptoms: chest pain and palpitations   Associated symptoms: no blood in stool, no diarrhea, no headaches, no nausea, no shortness of breath and no vomiting   Palpitations Associated symptoms: chest pain   Associated symptoms: no back pain, no cough, no diaphoresis, no dizziness, no nausea, no numbness, no shortness of breath and no vomiting     Past Medical History  Diagnosis Date  . Thyroid disease   . Heart palpitations   . Hypertension   . Bradycardia   . Osteopenia  06/2012 T score -1.9 FRAX    13%/3.6%  . Abnormal nuclear cardiac imaging test     normal coronary arteries by cath 2014   Past Surgical History  Procedure Laterality Date  . Cholecystectomy    . Dilation and curettage of uterus    . Hysteroscopy    . Tonsillectomy    . Cardiac catheterization      normal coronary arteries   Family History  Problem Relation Age of Onset  . Multiple sclerosis Mother   . Heart attack Father   . Cancer Sister     Skin cancer  . Diabetes Sister   . Heart failure Brother    History  Substance Use Topics  . Smoking status: Never Smoker   . Smokeless tobacco: Not on file  . Alcohol Use: No   OB History   Grav Para Term Preterm Abortions TAB SAB Ect Mult Living   2 2 2       2      Review of Systems  Constitutional: Positive for fatigue. Negative for fever, chills and diaphoresis.  HENT: Negative for congestion, rhinorrhea and sneezing.   Eyes: Negative.   Respiratory: Negative for cough, chest tightness and shortness of breath.   Cardiovascular: Positive for chest pain and palpitations. Negative for leg swelling.  Gastrointestinal: Negative for nausea, vomiting, abdominal pain, diarrhea and blood in stool.  Genitourinary: Negative for frequency, hematuria, flank pain and difficulty urinating.  Musculoskeletal: Negative for arthralgias and back pain.  Skin: Negative for rash.  Neurological: Positive for light-headedness (no  spinning). Negative for dizziness, speech difficulty, weakness, numbness and headaches.      Allergies  Lisinopril; Simvastatin; Welchol; and Zetia  Home Medications   Prior to Admission medications   Medication Sig Start Date End Date Taking? Authorizing Provider  aspirin 81 MG tablet Take 81 mg by mouth daily.   Yes Historical Provider, MD  Cyanocobalamin (VITAMIN B-12 IJ) Inject 1 inch as directed. Monthly   Yes Historical Provider, MD  fluticasone (FLONASE) 50 MCG/ACT nasal spray Place 1 spray into both  nostrils daily.   Yes Historical Provider, MD  hydrochlorothiazide (HYDRODIURIL) 25 MG tablet Take 12.5 mg by mouth daily.    Yes Historical Provider, MD  levothyroxine (SYNTHROID, LEVOTHROID) 50 MCG tablet Take 50 mcg by mouth daily.    Yes Historical Provider, MD  loratadine (CLARITIN) 10 MG tablet Take 10 mg by mouth daily as needed for allergies.    Yes Historical Provider, MD  losartan (COZAAR) 25 MG tablet Take 25 mg by mouth daily.   Yes Historical Provider, MD   BP 170/70  Pulse 60  Temp(Src) 97.8 F (36.6 C) (Oral)  Resp 17  SpO2 98% Physical Exam  Constitutional: She is oriented to person, place, and time. She appears well-developed and well-nourished.  HENT:  Head: Normocephalic and atraumatic.  Eyes: Pupils are equal, round, and reactive to light.  Neck: Normal range of motion. Neck supple.  Cardiovascular: Normal rate, regular rhythm and normal heart sounds.   Pulmonary/Chest: Effort normal and breath sounds normal. No respiratory distress. She has no wheezes. She has no rales. She exhibits no tenderness.  Abdominal: Soft. Bowel sounds are normal. There is no tenderness. There is no rebound and no guarding.  Musculoskeletal: Normal range of motion. She exhibits no edema.  Lymphadenopathy:    She has no cervical adenopathy.  Neurological: She is alert and oriented to person, place, and time. She has normal strength. No cranial nerve deficit or sensory deficit. GCS eye subscore is 4. GCS verbal subscore is 5. GCS motor subscore is 6.  No pronator drift.  FTN intact  Skin: Skin is warm and dry. No rash noted.  Psychiatric: She has a normal mood and affect.    ED Course  Procedures (including critical care time) Labs Review Results for orders placed during the hospital encounter of 03/19/14  CBC WITH DIFFERENTIAL      Result Value Ref Range   WBC 5.6  4.0 - 10.5 K/uL   RBC 4.08  3.87 - 5.11 MIL/uL   Hemoglobin 12.1  12.0 - 15.0 g/dL   HCT 36.3  36.0 - 46.0 %   MCV  89.0  78.0 - 100.0 fL   MCH 29.7  26.0 - 34.0 pg   MCHC 33.3  30.0 - 36.0 g/dL   RDW 12.5  11.5 - 15.5 %   Platelets 176  150 - 400 K/uL   Neutrophils Relative % 60  43 - 77 %   Neutro Abs 3.3  1.7 - 7.7 K/uL   Lymphocytes Relative 24  12 - 46 %   Lymphs Abs 1.3  0.7 - 4.0 K/uL   Monocytes Relative 9  3 - 12 %   Monocytes Absolute 0.5  0.1 - 1.0 K/uL   Eosinophils Relative 6 (*) 0 - 5 %   Eosinophils Absolute 0.4  0.0 - 0.7 K/uL   Basophils Relative 1  0 - 1 %   Basophils Absolute 0.0  0.0 - 0.1 K/uL  BASIC METABOLIC PANEL  Result Value Ref Range   Sodium 143  137 - 147 mEq/L   Potassium 4.7  3.7 - 5.3 mEq/L   Chloride 105  96 - 112 mEq/L   CO2 26  19 - 32 mEq/L   Glucose, Bld 95  70 - 99 mg/dL   BUN 19  6 - 23 mg/dL   Creatinine, Ser 0.84  0.50 - 1.10 mg/dL   Calcium 8.9  8.4 - 10.5 mg/dL   GFR calc non Af Amer 64 (*) >90 mL/min   GFR calc Af Amer 74 (*) >90 mL/min   Anion gap 12  5 - 15  URINALYSIS, ROUTINE W REFLEX MICROSCOPIC      Result Value Ref Range   Color, Urine YELLOW  YELLOW   APPearance CLEAR  CLEAR   Specific Gravity, Urine 1.007  1.005 - 1.030   pH 6.0  5.0 - 8.0   Glucose, UA NEGATIVE  NEGATIVE mg/dL   Hgb urine dipstick MODERATE (*) NEGATIVE   Bilirubin Urine NEGATIVE  NEGATIVE   Ketones, ur NEGATIVE  NEGATIVE mg/dL   Protein, ur NEGATIVE  NEGATIVE mg/dL   Urobilinogen, UA 0.2  0.0 - 1.0 mg/dL   Nitrite NEGATIVE  NEGATIVE   Leukocytes, UA NEGATIVE  NEGATIVE  URINE MICROSCOPIC-ADD ON      Result Value Ref Range   Squamous Epithelial / LPF RARE  RARE   WBC, UA 0-2  <3 WBC/hpf   RBC / HPF 3-6  <3 RBC/hpf   Bacteria, UA RARE  RARE  I-STAT TROPOININ, ED      Result Value Ref Range   Troponin i, poc 0.01  0.00 - 0.08 ng/mL   Comment 3            No results found.    Imaging Review Dg Chest 2 View  03/19/2014   CLINICAL DATA:  Near syncope.  Weakness.  EXAM: CHEST  2 VIEW  COMPARISON:  02/21/2010  FINDINGS: The heart size and mediastinal  contours are within normal limits. Chronic interstitial coarsening noted. No airspace consolidation. The visualized skeletal structures are unremarkable.  IMPRESSION: No active cardiopulmonary disease.   Electronically Signed   By: Kerby Moors M.D.   On: 03/19/2014 20:26     EKG Interpretation None      Date: 03/19/2014  Rate: 86  Rhythm: normal sinus rhythm, premature ventricular contractions (PVC) and bigeminy  QRS Axis: normal  Intervals: normal  ST/T Wave abnormalities: nonspecific ST/T changes  Conduction Disutrbances:none  Narrative Interpretation:   Old EKG Reviewed: unchanged   MDM   Final diagnoses:  Near syncope  Palpitations    Patient presents with near syncopal episodes associated palpitations. She has no neurologic deficits or symptoms. Her troponin is negative. I consulted the hospitalist for observation, telemetry and further evaluation.    Malvin Johns, MD 03/19/14 2211

## 2014-03-20 ENCOUNTER — Encounter (HOSPITAL_COMMUNITY): Payer: Self-pay | Admitting: *Deleted

## 2014-03-20 DIAGNOSIS — E039 Hypothyroidism, unspecified: Secondary | ICD-10-CM

## 2014-03-20 DIAGNOSIS — I10 Essential (primary) hypertension: Secondary | ICD-10-CM | POA: Diagnosis not present

## 2014-03-20 DIAGNOSIS — R55 Syncope and collapse: Secondary | ICD-10-CM | POA: Diagnosis not present

## 2014-03-20 LAB — COMPREHENSIVE METABOLIC PANEL
ALT: 10 U/L (ref 0–35)
AST: 21 U/L (ref 0–37)
Albumin: 2.6 g/dL — ABNORMAL LOW (ref 3.5–5.2)
Alkaline Phosphatase: 74 U/L (ref 39–117)
Anion gap: 11 (ref 5–15)
BILIRUBIN TOTAL: 0.3 mg/dL (ref 0.3–1.2)
BUN: 17 mg/dL (ref 6–23)
CO2: 26 meq/L (ref 19–32)
CREATININE: 0.89 mg/dL (ref 0.50–1.10)
Calcium: 8.7 mg/dL (ref 8.4–10.5)
Chloride: 105 mEq/L (ref 96–112)
GFR calc Af Amer: 69 mL/min — ABNORMAL LOW (ref >90)
GFR, EST NON AFRICAN AMERICAN: 60 mL/min — AB (ref >90)
GLUCOSE: 98 mg/dL (ref 70–99)
Potassium: 3.6 mEq/L — ABNORMAL LOW (ref 3.7–5.3)
Sodium: 142 mEq/L (ref 137–147)
Total Protein: 5.4 g/dL — ABNORMAL LOW (ref 6.0–8.3)

## 2014-03-20 LAB — CBC
HCT: 35.2 % — ABNORMAL LOW (ref 36.0–46.0)
Hemoglobin: 12.1 g/dL (ref 12.0–15.0)
MCH: 30.4 pg (ref 26.0–34.0)
MCHC: 34.4 g/dL (ref 30.0–36.0)
MCV: 88.4 fL (ref 78.0–100.0)
Platelets: 141 10*3/uL — ABNORMAL LOW (ref 150–400)
RBC: 3.98 MIL/uL (ref 3.87–5.11)
RDW: 12.4 % (ref 11.5–15.5)
WBC: 4.5 10*3/uL (ref 4.0–10.5)

## 2014-03-20 LAB — T4, FREE
Free T4: 1.11 ng/dL (ref 0.80–1.80)
Free T4: 1.12 ng/dL (ref 0.80–1.80)

## 2014-03-20 LAB — TSH: TSH: 6.92 u[IU]/mL — AB (ref 0.350–4.500)

## 2014-03-20 LAB — TROPONIN I
Troponin I: <0.3 ng/mL (ref <0.30)
Troponin I: <0.3 ng/mL (ref <0.30)
Troponin I: <0.3 ng/mL (ref <0.30)

## 2014-03-20 MED ORDER — LEVOTHYROXINE SODIUM 75 MCG PO TABS
75.0000 ug | ORAL_TABLET | Freq: Every day | ORAL | Status: DC
  Administered 2014-03-20 – 2014-03-21 (×2): 75 ug via ORAL
  Filled 2014-03-20 (×3): qty 1

## 2014-03-20 NOTE — Progress Notes (Signed)
Utilization Review Completed.   Lige Lakeman, RN, BSN Nurse Case Manager  

## 2014-03-20 NOTE — Progress Notes (Signed)
Pt had brief runs of junctional on telemetry with rate of 39.  Pt lying in bed without complaints.  Will continue to monitor closely.

## 2014-03-20 NOTE — Progress Notes (Signed)
[facility] PROGRESS NOTE  Assessment/Plan: Near syncope/Palpitations: - Telemetry showed bradycardia in the 40's. Cardiology already consulted. - Patient is now agreeable to a loop recorder. - Cardiac enzymes negative. - ? When to d.c.  Hypothyroidism - TSH 7.2. - repeat TSH and free T4.  Hypertension - stable, cont medications    DVT Prophylaxis: SCDs  Code Status: Full code  Family Communication: Discussed with the patient and her daughter-in-law  Disposition Plan: Observe the telemetry     Consultants:  cardiology  Procedures:  Loop recorder implanted  Antibiotics:  None  HPI/Subjective: No complains  Objective: Filed Vitals:   03/19/14 2130 03/20/14 0500 03/20/14 0503 03/20/14 0506  BP: 159/92 139/50 158/82 132/67  Pulse: 63 52 75 76  Temp: 98 F (36.7 C) 97.5 F (36.4 C)    TempSrc:      Resp: 16 12    Height: 5\' 3"  (1.6 m)     Weight: 56.427 kg (124 lb 6.4 oz)     SpO2: 99% 99%     No intake or output data in the 24 hours ending 03/20/14 0731 Filed Weights   03/19/14 2130  Weight: 56.427 kg (124 lb 6.4 oz)    Exam:  General: Alert, awake, oriented x3, in no acute distress.  HEENT: No bruits, no goiter.  Heart: Regular rate and rhythm. Lungs: Good air movement, clear. Abdomen: Soft, nontender, nondistended, positive bowel sounds.    Data Reviewed: Basic Metabolic Panel:  Recent Labs Lab 03/19/14 1840 03/19/14 2249 03/20/14 0046  NA 143  --  142  K 4.7  --  3.6*  CL 105  --  105  CO2 26  --  26  GLUCOSE 95  --  98  BUN 19  --  17  CREATININE 0.84  --  0.89  CALCIUM 8.9  --  8.7  MG  --  1.7  --    Liver Function Tests:  Recent Labs Lab 03/20/14 0046  AST 21  ALT 10  ALKPHOS 74  BILITOT 0.3  PROT 5.4*  ALBUMIN 2.6*   No results found for this basename: LIPASE, AMYLASE,  in the last 168 hours No results found for this basename: AMMONIA,  in the last 168 hours CBC:  Recent Labs Lab 03/19/14 1840  03/20/14 0046  WBC 5.6 4.5  NEUTROABS 3.3  --   HGB 12.1 12.1  HCT 36.3 35.2*  MCV 89.0 88.4  PLT 176 141*   Cardiac Enzymes:  Recent Labs Lab 03/20/14 0046  TROPONINI <0.30   BNP (last 3 results) No results found for this basename: PROBNP,  in the last 8760 hours CBG: No results found for this basename: GLUCAP,  in the last 168 hours  No results found for this or any previous visit (from the past 240 hour(s)).   Studies: Dg Chest 2 View  03/19/2014   CLINICAL DATA:  Near syncope.  Weakness.  EXAM: CHEST  2 VIEW  COMPARISON:  02/21/2010  FINDINGS: The heart size and mediastinal contours are within normal limits. Chronic interstitial coarsening noted. No airspace consolidation. The visualized skeletal structures are unremarkable.  IMPRESSION: No active cardiopulmonary disease.   Electronically Signed   By: Kerby Moors M.D.   On: 03/19/2014 20:26    Scheduled Meds: . aspirin  81 mg Oral Daily  . docusate sodium  100 mg Oral BID  . levothyroxine  75 mcg Oral QAC breakfast  . losartan  25 mg Oral Daily  . sodium chloride  3 mL Intravenous Q12H   Continuous Infusions: . sodium chloride 75 mL/hr at 03/20/14 0010     Charlynne Cousins  [facility] Pager [phone]. If 8PM-8AM, please contact night-coverage at [email or web address], password [facility] 03/20/2014, 7:31 AM  LOS: 1 day    **Disclaimer: This note may have been dictated with voice recognition software. Similar sounding words can inadvertently be transcribed and this note may contain transcription errors which may not have been corrected upon publication of note.**

## 2014-03-20 NOTE — Progress Notes (Signed)
Page by patient's nurse RN Gari Crown concerning patient going into slow atrial fibrillation. EKG appears to shows a slow atrial fibrillation, with with patient occasionally changing to a bradycardia. Review of patient's labs show that patient's TSH is elevated at 7.2, while on Synthroid 50 mcg daily.  A/P Atrial fibrillation/bradycardia  -Patient asymptomatic, cardiology on board with plans to place Loop recorder in a.m. -Continue to monitor closely  Hypothyroidism -Increase Synthroid to 75 mcg daily

## 2014-03-20 NOTE — Progress Notes (Signed)
Watching telemetry monitor, patient appears to be in and out of a-fib and normal sinus rhythm with frequent PVCs.  EKG completed and stated atrial fibrillation with premature ventricular or aberrantly conducted complexes, patient asymptomatic.  Patient heart rate 50s-70s.  [facility] paged.  Dr. Sherral Hammers returned page and was updated of all information in this note, no new orders received at this time.  Will continue to monitor patient.

## 2014-03-20 NOTE — Consult Note (Signed)
ELECTROPHYSIOLOGY CONSULT NOTE  Patient ID: Michelle Winters, MRN: [id number], DOB/AGE: 1931/08/27 80 y.o. Admit date: 03/19/2014 Date of Consult: 03/20/2014  Primary Physician: Mathews Argyle, MD Primary Cardiologist: TT  Chief Complaint:  lightheadedness   HPI Michelle Winters is a 79 y.o. female  With palpitations and lightheadedness She was seen last fall for similar symptoms and it was never clear whether the bradycardia was real or related to PVCs. Titus Dubin she had increasingly frequent spells of lightheadedness associated with pounding.  She associates this pounding with her PVCs  Functional status otherwise has been relatively stable  She has however had u[pper midsternal chest pain without radiation  Echo 10/14  Near normal LV function Cath>> normal coronary arteries ( after false + Myoview)  She has been quite frustrated with my care previously  She has explained her concerns and seems willing to proceed Past Medical History  Diagnosis Date  . Thyroid disease   . Heart palpitations   . Hypertension   . Bradycardia   . Osteopenia 06/2012 T score -1.9 FRAX    13%/3.6%  . Abnormal nuclear cardiac imaging test     normal coronary arteries by cath 2014      Surgical History:  Past Surgical History  Procedure Laterality Date  . Cholecystectomy    . Dilation and curettage of uterus    . Hysteroscopy    . Tonsillectomy    . Cardiac catheterization      normal coronary arteries     Home Meds: Prior to Admission medications   Medication Sig Start Date End Date Taking? Authorizing Provider  aspirin 81 MG tablet Take 81 mg by mouth daily.   Yes Historical Provider, MD  Cyanocobalamin (VITAMIN B-12 IJ) Inject 1 inch as directed. Monthly   Yes Historical Provider, MD  fluticasone (FLONASE) 50 MCG/ACT nasal spray Place 1 spray into both nostrils daily.   Yes Historical Provider, MD  hydrochlorothiazide (HYDRODIURIL) 25 MG tablet Take 12.5 mg by mouth daily.    Yes  Historical Provider, MD  levothyroxine (SYNTHROID, LEVOTHROID) 50 MCG tablet Take 50 mcg by mouth daily.    Yes Historical Provider, MD  loratadine (CLARITIN) 10 MG tablet Take 10 mg by mouth daily as needed for allergies.    Yes Historical Provider, MD  losartan (COZAAR) 25 MG tablet Take 25 mg by mouth daily.   Yes Historical Provider, MD    Inpatient Medications:  . aspirin  81 mg Oral Daily  . docusate sodium  100 mg Oral BID  . levothyroxine  75 mcg Oral QAC breakfast  . losartan  25 mg Oral Daily  . sodium chloride  3 mL Intravenous Q12H     Allergies:  Allergies  Allergen Reactions  . Lisinopril Other (See Comments)    Chest tightness  . Simvastatin Other (See Comments)    Muscle pain  . Welchol [Colesevelam Hcl] Other (See Comments)    Muscle pain   . Zetia [Ezetimibe] Nausea Only    History   Social History  . Marital Status: Married    Spouse Name: [name]    Number of Children: [name]  . Years of Education: [name]   Occupational History  . Not on file.   Social History Main Topics  . Smoking status: Never Smoker   . Smokeless tobacco: Not on file  . Alcohol Use: No  . Drug Use: No  . Sexual Activity: No   Other Topics Concern  . Not on file  Social History Narrative  . No narrative on file     Family History  Problem Relation Age of Onset  . Multiple sclerosis Mother   . Heart attack Father   . Cancer Sister     Skin cancer  . Diabetes Sister   . Nephritis Other   . Leukemia Other      ROS:  Please see the history of present illness.     All other systems reviewed and negative.    Physical Exam: Blood pressure 132/67, pulse 76, temperature 97.5 F (36.4 C), temperature source Oral, resp. rate 12, height 5\' 3"  (1.6 m), weight 124 lb 6.4 oz (56.427 kg), SpO2 99.00%. General: Well developed, well nourished female in no acute distress. Head: Normocephalic, atraumatic, sclera non-icteric, no xanthomas, nares are without discharge. EENT:  normal Lymph Nodes:  none Back: without scoliosis/kyphosis, no CVA tendersness Neck: Negative for carotid bruits. JVD not elevated. Lungs: Clear bilaterally to auscultation without wheezes, rales, or rhonchi. Breathing is unlabored. Heart: RRR with S1 S2. No murmur , rubs, or gallops appreciated. Abdomen: Soft, non-tender, non-distended with normoactive bowel sounds. No hepatomegaly. No rebound/guarding. No obvious abdominal masses. Msk:  Strength and tone appear normal for age. Extremities: No clubbing or cyanosis. No edema.  Distal pedal pulses are 2+ and equal bilaterally. Skin: Warm and Dry Neuro: Alert and oriented X 3. CN III-XII intact Grossly normal sensory and motor function . Psych:  Responds to questions appropriately with a normal affect.      Labs: Cardiac Enzymes  Recent Labs  03/20/14 0046 03/20/14 0625  TROPONINI <0.30 <0.30   CBC Lab Results  Component Value Date   WBC 4.5 03/20/2014   HGB 12.1 03/20/2014   HCT 35.2* 03/20/2014   MCV 88.4 03/20/2014   PLT 141* 03/20/2014   PROTIME: No results found for this basename: LABPROT, INR,  in the last 72 hours Chemistry  Recent Labs Lab 03/20/14 0046  NA 142  K 3.6*  CL 105  CO2 26  BUN 17  CREATININE 0.89  CALCIUM 8.7  PROT 5.4*  BILITOT 0.3  ALKPHOS 74  ALT 10  AST 21  GLUCOSE 98   Lipids No results found for this basename: CHOL, HDL, LDLCALC, TRIG   BNP No results found for this basename: probnp   Miscellaneous No results found for this basename: DDIMER    Radiology/Studies:  Dg Chest 2 View  03/19/2014   CLINICAL DATA:  Near syncope.  Weakness.  EXAM: CHEST  2 VIEW  COMPARISON:  02/21/2010  FINDINGS: The heart size and mediastinal contours are within normal limits. Chronic interstitial coarsening noted. No airspace consolidation. The visualized skeletal structures are unremarkable.  IMPRESSION: No active cardiopulmonary disease.   Electronically Signed   By: Kerby Moors M.D.   On: 03/19/2014  20:26    EKG:   Sinus with PVCs  and sinu8s with sinus arrest and pause 1.8 sec   Assessment and Plan:  Lightheadedness  PVC  Sinus node dysfunction  We need to identify the rhythm correlate with her symptoms, ie are the PVCs or sinus node dysfunction Hopefully she willhave these spells whle in house and have demonstrated for her the use of the patient activated telemetry We will make a decision in the am re loop recorder, given the frequency she might well benefit from 30 d monitor  Virl Axe

## 2014-03-21 ENCOUNTER — Encounter (INDEPENDENT_AMBULATORY_CARE_PROVIDER_SITE_OTHER): Payer: Medicare Other

## 2014-03-21 ENCOUNTER — Other Ambulatory Visit: Payer: Self-pay | Admitting: Physician Assistant

## 2014-03-21 ENCOUNTER — Encounter: Payer: Self-pay | Admitting: *Deleted

## 2014-03-21 DIAGNOSIS — R001 Bradycardia, unspecified: Secondary | ICD-10-CM

## 2014-03-21 DIAGNOSIS — I4891 Unspecified atrial fibrillation: Secondary | ICD-10-CM | POA: Diagnosis not present

## 2014-03-21 DIAGNOSIS — Z833 Family history of diabetes mellitus: Secondary | ICD-10-CM | POA: Diagnosis not present

## 2014-03-21 DIAGNOSIS — Z888 Allergy status to other drugs, medicaments and biological substances status: Secondary | ICD-10-CM | POA: Diagnosis not present

## 2014-03-21 DIAGNOSIS — M899 Disorder of bone, unspecified: Secondary | ICD-10-CM | POA: Diagnosis present

## 2014-03-21 DIAGNOSIS — R55 Syncope and collapse: Secondary | ICD-10-CM | POA: Diagnosis not present

## 2014-03-21 DIAGNOSIS — I498 Other specified cardiac arrhythmias: Secondary | ICD-10-CM

## 2014-03-21 DIAGNOSIS — E039 Hypothyroidism, unspecified: Secondary | ICD-10-CM | POA: Diagnosis not present

## 2014-03-21 DIAGNOSIS — Z7982 Long term (current) use of aspirin: Secondary | ICD-10-CM | POA: Diagnosis not present

## 2014-03-21 DIAGNOSIS — I10 Essential (primary) hypertension: Secondary | ICD-10-CM | POA: Diagnosis not present

## 2014-03-21 DIAGNOSIS — Z8249 Family history of ischemic heart disease and other diseases of the circulatory system: Secondary | ICD-10-CM | POA: Diagnosis not present

## 2014-03-21 DIAGNOSIS — I495 Sick sinus syndrome: Secondary | ICD-10-CM | POA: Diagnosis not present

## 2014-03-21 DIAGNOSIS — R42 Dizziness and giddiness: Secondary | ICD-10-CM | POA: Diagnosis present

## 2014-03-21 NOTE — Discharge Summary (Signed)
Physician Discharge Summary  Michelle Winters ZOX:[id number] DOB: Oct 30, 1931 DOA: 03/19/2014  PCP: Mathews Argyle, MD  Admit date: 03/19/2014 Discharge date: 03/21/2014  Time spent: 35  minutes  Recommendations for Outpatient Follow-up:  1. Follow up with EP 2. Pick up event monitor.  Discharge Diagnoses:  Principal Problem:   Near syncope Active Problems:   Hypertension   PVC (premature ventricular contraction)   Palpitations   Hypothyroidism   Discharge Condition: stable  Diet recommendation: heart healthy  Filed Weights   03/19/14 2130  Weight: 56.427 kg (124 lb 6.4 oz)    History of present illness:  80 y.o. female with a past medical history of hypothyroidism, hypertension, who has had syncopal episodes in past and has had cardiac workup for the same and who presents to the hospital with lightheadedness on and off for the last one week. These episodes last a few seconds. And, then since yesterday she has noticed fluttering in her chest. And these feelings of lightheadedness have gotten worse. However, hasn't had any syncopal episodes. These events occur mainly when she is standing up at work or reaching up to pick up something.   Hospital Course:  Near syncope/Palpitations:  - Telemetry showed bradycardia in the 40's. Appreciate EP assistance.  - no events. - Patient has appointment to pick up event monitor - Cardiac enzymes negative.   Hypothyroidism  - TSH 7.2.  - repeat TSH is 6.9 and free T4 < 2.0   Hypertension  - stable, cont medications   Procedures:  CXR  Consultations:  EP  Discharge Exam: Filed Vitals:   03/21/14 1343  BP: 156/54  Pulse: 58  Temp: 98.6 F (37 C)  Resp: 16    General: see progress note  Discharge Instructions You were cared for by a hospitalist during your hospital stay. If you have any questions about your discharge medications or the care you received while you were in the hospital after you are discharged, you  can call the unit and asked to speak with the hospitalist on call if the hospitalist that took care of you is not available. Once you are discharged, your primary care physician will handle any further medical issues. Please note that NO REFILLS for any discharge medications will be authorized once you are discharged, as it is imperative that you return to your primary care physician (or establish a relationship with a primary care physician if you do not have one) for your aftercare needs so that they can reassess your need for medications and monitor your lab values.  Discharge Instructions   Diet - low sodium heart healthy    Complete by:  As directed      Increase activity slowly    Complete by:  As directed             Medication List         aspirin 81 MG tablet  Take 81 mg by mouth daily.     fluticasone 50 MCG/ACT nasal spray  Commonly known as:  FLONASE  Place 1 spray into both nostrils daily.     hydrochlorothiazide 25 MG tablet  Commonly known as:  HYDRODIURIL  Take 12.5 mg by mouth daily.     levothyroxine 50 MCG tablet  Commonly known as:  SYNTHROID, LEVOTHROID  Take 50 mcg by mouth daily.     loratadine 10 MG tablet  Commonly known as:  CLARITIN  Take 10 mg by mouth daily as needed for allergies.  losartan 25 MG tablet  Commonly known as:  COZAAR  Take 25 mg by mouth daily.     VITAMIN B-12 IJ  Inject 1 inch as directed. Monthly       Allergies  Allergen Reactions  . Lisinopril Other (See Comments)    Chest tightness  . Simvastatin Other (See Comments)    Muscle pain  . Welchol [Colesevelam Hcl] Other (See Comments)    Muscle pain   . Zetia [Ezetimibe] Nausea Only       Follow-up Information   [facility] up with [facility] 03[facility]/2014. (Event monitor at 4:30 pm. )    Contact information:   [address] [place] [address]       Follow up with Virl Axe, MD. (See in 5-6 weeks, the office will call.)    Specialty:   Cardiology   Contact information:   [address] [place] [address] [phone]        The results of significant diagnostics from this hospitalization (including imaging, microbiology, ancillary and laboratory) are listed below for reference.    Significant Diagnostic Studies: Dg Chest 2 View  03/19/2014   CLINICAL DATA:  Near syncope.  Weakness.  EXAM: CHEST  2 VIEW  COMPARISON:  02/21/2010  FINDINGS: The heart size and mediastinal contours are within normal limits. Chronic interstitial coarsening noted. No airspace consolidation. The visualized skeletal structures are unremarkable.  IMPRESSION: No active cardiopulmonary disease.   Electronically Signed   By: Kerby Moors M.D.   On: 03/19/2014 20:26    Microbiology: No results found for this or any previous visit (from the past 240 hour(s)).   Labs: Basic Metabolic Panel:  Recent Labs Lab 03/19/14 1840 03/19/14 2249 03/20/14 0046  NA 143  --  142  K 4.7  --  3.6*  CL 105  --  105  CO2 26  --  26  GLUCOSE 95  --  98  BUN 19  --  17  CREATININE 0.84  --  0.89  CALCIUM 8.9  --  8.7  MG  --  1.7  --    Liver Function Tests:  Recent Labs Lab 03/20/14 0046  AST 21  ALT 10  ALKPHOS 74  BILITOT 0.3  PROT 5.4*  ALBUMIN 2.6*   No results found for this basename: LIPASE, AMYLASE,  in the last 168 hours No results found for this basename: AMMONIA,  in the last 168 hours CBC:  Recent Labs Lab 03/19/14 1840 03/20/14 0046  WBC 5.6 4.5  NEUTROABS 3.3  --   HGB 12.1 12.1  HCT 36.3 35.2*  MCV 89.0 88.4  PLT 176 141*   Cardiac Enzymes:  Recent Labs Lab 03/20/14 0046 03/20/14 0625 03/20/14 1245  TROPONINI <0.30 <0.30 <0.30   BNP: BNP (last 3 results) No results found for this basename: PROBNP,  in the last 8760 hours CBG: No results found for this basename: GLUCAP,  in the last 168 hours     Signed:  Charlynne Cousins  [facility] 03/21/2014, 3:29 PM

## 2014-03-21 NOTE — Progress Notes (Signed)
[facility] PROGRESS NOTE  Assessment/Plan: Near syncope/Palpitations: - Telemetry showed bradycardia in the 40's. Appreciate EP assistance. - Patient is now agreeable to a loop recorder. - Cardiac enzymes negative.  Hypothyroidism - TSH 7.2. - repeat TSH is 6.9 and free T4 < 2.0  Hypertension - stable, cont medications    DVT Prophylaxis: SCDs  Code Status: Full code  Family Communication: Discussed with the patient and her daughter-in-law  Disposition Plan: Observe the telemetry     Consultants:  cardiology  Procedures:    Antibiotics:  None  HPI/Subjective: No complains  Objective: Filed Vitals:   03/20/14 0506 03/20/14 1356 03/20/14 2100 03/21/14 0500  BP: 132/67 108/52 121/64 133/60  Pulse: 76 55 57 53  Temp:  97.7 F (36.5 C) 98.6 F (37 C) 97.8 F (36.6 C)  TempSrc:  Oral Oral Oral  Resp:  17 18 18   Height:      Weight:      SpO2:  100% 98% 98%    Intake/Output Summary (Last 24 hours) at 03/21/14 0753 Last data filed at 03/20/14 2141  Gross per 24 hour  Intake    243 ml  Output      0 ml  Net    243 ml   Filed Weights   03/19/14 2130  Weight: 56.427 kg (124 lb 6.4 oz)    Exam:  General: Alert, awake, oriented x3, in no acute distress.  HEENT: No bruits, no goiter.  Heart: Regular rate and rhythm. Lungs: Good air movement, clear. Abdomen: Soft, nontender, nondistended, positive bowel sounds.    Data Reviewed: Basic Metabolic Panel:  Recent Labs Lab 03/19/14 1840 03/19/14 2249 03/20/14 0046  NA 143  --  142  K 4.7  --  3.6*  CL 105  --  105  CO2 26  --  26  GLUCOSE 95  --  98  BUN 19  --  17  CREATININE 0.84  --  0.89  CALCIUM 8.9  --  8.7  MG  --  1.7  --    Liver Function Tests:  Recent Labs Lab 03/20/14 0046  AST 21  ALT 10  ALKPHOS 74  BILITOT 0.3  PROT 5.4*  ALBUMIN 2.6*   No results found for this basename: LIPASE, AMYLASE,  in the last 168 hours No results found for this basename:  AMMONIA,  in the last 168 hours CBC:  Recent Labs Lab 03/19/14 1840 03/20/14 0046  WBC 5.6 4.5  NEUTROABS 3.3  --   HGB 12.1 12.1  HCT 36.3 35.2*  MCV 89.0 88.4  PLT 176 141*   Cardiac Enzymes:  Recent Labs Lab 03/20/14 0046 03/20/14 0625 03/20/14 1245  TROPONINI <0.30 <0.30 <0.30   BNP (last 3 results) No results found for this basename: PROBNP,  in the last 8760 hours CBG: No results found for this basename: GLUCAP,  in the last 168 hours  No results found for this or any previous visit (from the past 240 hour(s)).   Studies: Dg Chest 2 View  03/19/2014   CLINICAL DATA:  Near syncope.  Weakness.  EXAM: CHEST  2 VIEW  COMPARISON:  02/21/2010  FINDINGS: The heart size and mediastinal contours are within normal limits. Chronic interstitial coarsening noted. No airspace consolidation. The visualized skeletal structures are unremarkable.  IMPRESSION: No active cardiopulmonary disease.   Electronically Signed   By: Kerby Moors M.D.   On: 03/19/2014 20:26    Scheduled Meds: . aspirin  81 mg Oral Daily  .  docusate sodium  100 mg Oral BID  . levothyroxine  75 mcg Oral QAC breakfast  . losartan  25 mg Oral Daily  . sodium chloride  3 mL Intravenous Q12H   Continuous Infusions:     Charlynne Cousins  [facility] Pager [phone]. If 8PM-8AM, please contact night-coverage at [email or web address], password [facility] 03/21/2014, 7:53 AM  LOS: 2 days    **Disclaimer: This note may have been dictated with voice recognition software. Similar sounding words can inadvertently be transcribed and this note may contain transcription errors which may not have been corrected upon publication of note.**

## 2014-03-21 NOTE — Progress Notes (Signed)
Patient ID: Michelle Winters, female   DOB: 1931-09-24, 80 y.o.   MRN: [id number] E-Cardio verite 30 day cardiac event monitor applied to patient.

## 2014-03-21 NOTE — Progress Notes (Signed)
/       Patient Name: Michelle Winters      SUBJECTIVE: with no appreciable spells overnight  Past Medical History  Diagnosis Date  . Thyroid disease   . Heart palpitations   . Hypertension   . Bradycardia   . Osteopenia 06/2012 T score -1.9 FRAX    13%/3.6%  . Abnormal nuclear cardiac imaging test     normal coronary arteries by cath 2014    Scheduled Meds:  Scheduled Meds: . aspirin  81 mg Oral Daily  . docusate sodium  100 mg Oral BID  . levothyroxine  75 mcg Oral QAC breakfast  . losartan  25 mg Oral Daily  . sodium chloride  3 mL Intravenous Q12H   Continuous Infusions:  acetaminophen, acetaminophen, albuterol, ondansetron (ZOFRAN) IV, ondansetron    PHYSICAL EXAM Filed Vitals:   03/20/14 1356 03/20/14 2100 03/21/14 0500 03/21/14 1343  BP: 108/52 121/64 133/60 156/54  Pulse: 55 57 53 58  Temp: 97.7 F (36.5 C) 98.6 F (37 C) 97.8 F (36.6 C) 98.6 F (37 C)  TempSrc: Oral Oral Oral Oral  Resp: 17 18 18 16   Height:      Weight:      SpO2: 100% 98% 98% 100%    Well developed and nourished in no acute distress HENT normal Neck supple with JVP-flat Clear Regular rate and rhythm, no murmurs or gallops Abd-soft with active BS No Clubbing cyanosis edema Skin-warm and dry A & Oriented  Grossly normal sensory and motor function   TELEMETRY: Reviewed telemetry pt in junctional rhythm with sinus pauses and PVCs:    Intake/Output Summary (Last 24 hours) at 03/21/14 1500 Last data filed at 03/21/14 1300  Gross per 24 hour  Intake    603 ml  Output      0 ml  Net    603 ml    LABS: Basic Metabolic Panel:  Recent Labs Lab 03/19/14 1840 03/19/14 2249 03/20/14 0046  NA 143  --  142  K 4.7  --  3.6*  CL 105  --  105  CO2 26  --  26  GLUCOSE 95  --  98  BUN 19  --  17  CREATININE 0.84  --  0.89  CALCIUM 8.9  --  8.7  MG  --  1.7  --    Cardiac Enzymes:  Recent Labs  03/20/14 0046 03/20/14 0625 03/20/14 1245  TROPONINI <0.30  <0.30 <0.30   CBC:  Recent Labs Lab 03/19/14 1840 03/20/14 0046  WBC 5.6 4.5  NEUTROABS 3.3  --   HGB 12.1 12.1  HCT 36.3 35.2*  MCV 89.0 88.4  PLT 176 141*   PROTIME: No results found for this basename: LABPROT, INR,  in the last 72 hours Liver Function Tests:  Recent Labs  03/20/14 0046  AST 21  ALT 10  ALKPHOS 74  BILITOT 0.3  PROT 5.4*  ALBUMIN 2.6*   No results found for this basename: LIPASE, AMYLASE,  in the last 72 hours BNP: BNP (last 3 results) No results found for this basename: PROBNP,  in the last 8760 hours D-Dimer: No results found for this basename: DDIMER,  in the last 72 hours Hemoglobin A1C: No results found for this basename: HGBA1C,  in the last 72 hours Fasting Lipid Panel: No results found for this basename: CHOL, HDL, LDLCALC, TRIG, CHOLHDL, LDLDIRECT,  in the last 72 hours Thyroid Function Tests:  Recent Labs  03/20/14 0830  TSH  6.920*      ASSESSMENT AND PLAN:  Principal Problem:   Near syncope Active Problems:   Hypertension   PVC (premature ventricular contraction)   Palpitations   Hypothyroidism  TSH high >>increase synthroid per PCP  NO interval episodes  Would discharge with an event recorder and arrange outpatient followup  Signed, Virl Axe MD  03/21/2014

## 2014-03-21 NOTE — Progress Notes (Signed)
Pt has appt at 4:30 today for event monitor. Pt aware.  Rosaria Ferries, PA-C 03/21/2014 3:03 PM Beeper [phone]

## 2014-03-23 ENCOUNTER — Telehealth: Payer: Self-pay | Admitting: *Deleted

## 2014-03-23 NOTE — Telephone Encounter (Signed)
Dr. Caryl Comes reviewed eCardio report for 03/22/14. No orders received.

## 2014-03-23 NOTE — Telephone Encounter (Signed)
Received ecardio report from medical records. Report day 2 - serious notification from 03/22/14 at 10:10 (CT) -- Patient states that she is doing okay, better yesterday than today. She cleaned out her laundry room yesterday. Discuss PVCs with her.  She didn't notice any, "i have been getting those for a long time, I don't really notice them". Felt lightheaded this am, tried to record the symptoms on her monitor.  Had to call ecardio because they were sent incorrectly. Will give report to Dr. Olin Pia nurse for him to review.

## 2014-03-25 ENCOUNTER — Telehealth: Payer: Self-pay | Admitting: *Deleted

## 2014-03-25 ENCOUNTER — Inpatient Hospital Stay (HOSPITAL_COMMUNITY)
Admission: EM | Admit: 2014-03-25 | Discharge: 2014-03-29 | DRG: 310 | Disposition: A | Discharge status: Home / Self Care | Payer: Medicare Other | Attending: Cardiovascular Disease | Admitting: Cardiovascular Disease

## 2014-03-25 ENCOUNTER — Encounter (HOSPITAL_COMMUNITY): Payer: Self-pay | Admitting: Emergency Medicine

## 2014-03-25 DIAGNOSIS — I4949 Other premature depolarization: Secondary | ICD-10-CM | POA: Diagnosis not present

## 2014-03-25 DIAGNOSIS — I498 Other specified cardiac arrhythmias: Secondary | ICD-10-CM

## 2014-03-25 DIAGNOSIS — I2510 Atherosclerotic heart disease of native coronary artery without angina pectoris: Secondary | ICD-10-CM | POA: Diagnosis present

## 2014-03-25 DIAGNOSIS — I4510 Unspecified right bundle-branch block: Secondary | ICD-10-CM | POA: Diagnosis present

## 2014-03-25 DIAGNOSIS — M129 Arthropathy, unspecified: Secondary | ICD-10-CM | POA: Diagnosis present

## 2014-03-25 DIAGNOSIS — I493 Ventricular premature depolarization: Secondary | ICD-10-CM

## 2014-03-25 DIAGNOSIS — E039 Hypothyroidism, unspecified: Secondary | ICD-10-CM | POA: Diagnosis not present

## 2014-03-25 DIAGNOSIS — R002 Palpitations: Secondary | ICD-10-CM | POA: Diagnosis not present

## 2014-03-25 DIAGNOSIS — I10 Essential (primary) hypertension: Secondary | ICD-10-CM | POA: Diagnosis not present

## 2014-03-25 DIAGNOSIS — R001 Bradycardia, unspecified: Secondary | ICD-10-CM

## 2014-03-25 DIAGNOSIS — R55 Syncope and collapse: Secondary | ICD-10-CM | POA: Diagnosis not present

## 2014-03-25 DIAGNOSIS — M949 Disorder of cartilage, unspecified: Secondary | ICD-10-CM

## 2014-03-25 DIAGNOSIS — M899 Disorder of bone, unspecified: Secondary | ICD-10-CM | POA: Diagnosis present

## 2014-03-25 DIAGNOSIS — Z7982 Long term (current) use of aspirin: Secondary | ICD-10-CM

## 2014-03-25 DIAGNOSIS — R000 Tachycardia, unspecified: Secondary | ICD-10-CM

## 2014-03-25 DIAGNOSIS — I495 Sick sinus syndrome: Principal | ICD-10-CM | POA: Diagnosis present

## 2014-03-25 DIAGNOSIS — I4729 Other ventricular tachycardia: Secondary | ICD-10-CM | POA: Diagnosis not present

## 2014-03-25 DIAGNOSIS — I471 Supraventricular tachycardia, unspecified: Secondary | ICD-10-CM

## 2014-03-25 DIAGNOSIS — I472 Ventricular tachycardia, unspecified: Secondary | ICD-10-CM

## 2014-03-25 DIAGNOSIS — I455 Other specified heart block: Secondary | ICD-10-CM

## 2014-03-25 DIAGNOSIS — I059 Rheumatic mitral valve disease, unspecified: Secondary | ICD-10-CM | POA: Diagnosis not present

## 2014-03-25 HISTORY — DX: Hypothyroidism, unspecified: E03.9

## 2014-03-25 HISTORY — DX: Atherosclerotic heart disease of native coronary artery without angina pectoris: I25.10

## 2014-03-25 HISTORY — DX: Tachycardia, unspecified: R00.0

## 2014-03-25 HISTORY — DX: Cystitis, unspecified without hematuria: N30.90

## 2014-03-25 HISTORY — DX: Cardiac murmur, unspecified: R01.1

## 2014-03-25 HISTORY — DX: Unspecified osteoarthritis, unspecified site: M19.90

## 2014-03-25 HISTORY — DX: Ventricular tachycardia: I47.2

## 2014-03-25 LAB — CBC WITH DIFFERENTIAL/PLATELET
BASOS ABS: 0 10*3/uL (ref 0.0–0.1)
Basophils Relative: 1 % (ref 0–1)
Eosinophils Absolute: 0.3 10*3/uL (ref 0.0–0.7)
Eosinophils Relative: 5 % (ref 0–5)
HCT: 36.2 % (ref 36.0–46.0)
HEMOGLOBIN: 12.5 g/dL (ref 12.0–15.0)
LYMPHS ABS: 1.1 10*3/uL (ref 0.7–4.0)
LYMPHS PCT: 18 % (ref 12–46)
MCH: 30.6 pg (ref 26.0–34.0)
MCHC: 34.5 g/dL (ref 30.0–36.0)
MCV: 88.5 fL (ref 78.0–100.0)
MONOS PCT: 10 % (ref 3–12)
Monocytes Absolute: 0.6 10*3/uL (ref 0.1–1.0)
NEUTROS ABS: 3.9 10*3/uL (ref 1.7–7.7)
NEUTROS PCT: 66 % (ref 43–77)
Platelets: 168 10*3/uL (ref 150–400)
RBC: 4.09 MIL/uL (ref 3.87–5.11)
RDW: 12.5 % (ref 11.5–15.5)
WBC: 5.9 10*3/uL (ref 4.0–10.5)

## 2014-03-25 LAB — BASIC METABOLIC PANEL
ANION GAP: 12 (ref 5–15)
BUN: 19 mg/dL (ref 6–23)
CHLORIDE: 104 meq/L (ref 96–112)
CO2: 24 mEq/L (ref 19–32)
Calcium: 8.6 mg/dL (ref 8.4–10.5)
Creatinine, Ser: 0.88 mg/dL (ref 0.50–1.10)
GFR calc non Af Amer: 60 mL/min — ABNORMAL LOW (ref >90)
GFR, EST AFRICAN AMERICAN: 70 mL/min — AB (ref >90)
Glucose, Bld: 98 mg/dL (ref 70–99)
Potassium: 4.3 mEq/L (ref 3.7–5.3)
Sodium: 140 mEq/L (ref 137–147)

## 2014-03-25 LAB — CBC
HEMATOCRIT: 36.1 % (ref 36.0–46.0)
HEMOGLOBIN: 12.5 g/dL (ref 12.0–15.0)
MCH: 30.4 pg (ref 26.0–34.0)
MCHC: 34.6 g/dL (ref 30.0–36.0)
MCV: 87.8 fL (ref 78.0–100.0)
Platelets: 163 10*3/uL (ref 150–400)
RBC: 4.11 MIL/uL (ref 3.87–5.11)
RDW: 12.5 % (ref 11.5–15.5)
WBC: 4.7 10*3/uL (ref 4.0–10.5)

## 2014-03-25 LAB — CREATININE, SERUM
Creatinine, Ser: 0.83 mg/dL (ref 0.50–1.10)
GFR calc non Af Amer: 65 mL/min — ABNORMAL LOW (ref >90)
GFR, EST AFRICAN AMERICAN: 75 mL/min — AB (ref >90)

## 2014-03-25 LAB — I-STAT TROPONIN, ED: Troponin i, poc: 0.01 ng/mL (ref 0.00–0.08)

## 2014-03-25 LAB — TROPONIN I

## 2014-03-25 LAB — TSH: TSH: 6.15 u[IU]/mL — AB (ref 0.350–4.500)

## 2014-03-25 MED ORDER — METOPROLOL TARTRATE 12.5 MG HALF TABLET
12.5000 mg | ORAL_TABLET | Freq: Two times a day (BID) | ORAL | Status: DC
  Filled 2014-03-25 (×8): qty 1

## 2014-03-25 MED ORDER — HEPARIN SODIUM (PORCINE) 5000 UNIT/ML IJ SOLN
5000.0000 [IU] | Freq: Three times a day (TID) | INTRAMUSCULAR | Status: DC
  Administered 2014-03-26 – 2014-03-29 (×7): 5000 [IU] via SUBCUTANEOUS
  Filled 2014-03-25 (×12): qty 1

## 2014-03-25 MED ORDER — HYDROCHLOROTHIAZIDE 25 MG PO TABS
12.5000 mg | ORAL_TABLET | Freq: Every day | ORAL | Status: DC
  Administered 2014-03-26 – 2014-03-28 (×3): 12.5 mg via ORAL
  Filled 2014-03-25 (×4): qty 0.5

## 2014-03-25 MED ORDER — LORATADINE 10 MG PO TABS
10.0000 mg | ORAL_TABLET | Freq: Every day | ORAL | Status: DC | PRN
  Filled 2014-03-25: qty 1

## 2014-03-25 MED ORDER — ONDANSETRON HCL 4 MG/2ML IJ SOLN
4.0000 mg | Freq: Four times a day (QID) | INTRAMUSCULAR | Status: DC | PRN
Start: 2014-03-25 — End: 2014-03-29

## 2014-03-25 MED ORDER — FLUTICASONE PROPIONATE 50 MCG/ACT NA SUSP
1.0000 | Freq: Every day | NASAL | Status: DC
  Filled 2014-03-25: qty 16

## 2014-03-25 MED ORDER — ACETAMINOPHEN 325 MG PO TABS: 650.0000 mg | ORAL_TABLET | ORAL | Status: DC | PRN

## 2014-03-25 MED ORDER — LEVOTHYROXINE SODIUM 50 MCG PO TABS
50.0000 ug | ORAL_TABLET | Freq: Every day | ORAL | Status: DC
  Administered 2014-03-26 – 2014-03-29 (×4): 50 ug via ORAL
  Filled 2014-03-25 (×6): qty 1

## 2014-03-25 MED ORDER — LOSARTAN POTASSIUM 25 MG PO TABS: 25.0000 mg | ORAL_TABLET | Freq: Every day | ORAL | Status: DC

## 2014-03-25 MED ORDER — ASPIRIN 81 MG PO CHEW
81.0000 mg | CHEWABLE_TABLET | Freq: Every day | ORAL | Status: DC
  Administered 2014-03-26 – 2014-03-29 (×4): 81 mg via ORAL
  Filled 2014-03-25 (×5): qty 1

## 2014-03-25 NOTE — ED Provider Notes (Signed)
CSN: [id number]     Arrival date & time 03/25/14  1700 History   First MD Initiated Contact with Patient 03/25/14 1917     Chief Complaint  Patient presents with  . Tachycardia      Patient is a 80 y.o. female presenting with palpitations. The history is provided by the patient.  Palpitations Onset quality:  Sudden Timing:  Constant Progression:  Resolved Chronicity:  Recurrent Relieved by:  None tried Worsened by:  Nothing tried Associated symptoms: dizziness   Associated symptoms: no chest pain, no shortness of breath, no syncope and no vomiting   Pt reports she was sweeping at her house today when she felt dizziness.  No LOC No CP/SOB She is now feeling improved She reports she is wearing an event recorder at home and was called by cardiology to be evaluated in the ER  She has no complaints at this time  Past Medical History  Diagnosis Date  . Thyroid disease   . Heart palpitations   . Hypertension   . Bradycardia   . Osteopenia 06/2012 T score -1.9 FRAX    13%/3.6%  . Abnormal nuclear cardiac imaging test     normal coronary arteries by cath 2014   Past Surgical History  Procedure Laterality Date  . Cholecystectomy    . Dilation and curettage of uterus    . Hysteroscopy    . Tonsillectomy    . Cardiac catheterization      normal coronary arteries   Family History  Problem Relation Age of Onset  . Multiple sclerosis Mother   . Heart attack Father   . Cancer Sister     Skin cancer  . Diabetes Sister   . Nephritis Other   . Leukemia Other    History  Substance Use Topics  . Smoking status: Never Smoker   . Smokeless tobacco: Not on file  . Alcohol Use: No   OB History   Grav Para Term Preterm Abortions TAB SAB Ect Mult Living   2 2 2       2      Review of Systems  Constitutional: Negative for fever.  Respiratory: Negative for shortness of breath.   Cardiovascular: Positive for palpitations. Negative for chest pain and syncope.  Gastrointestinal:  Negative for vomiting.  Neurological: Positive for dizziness. Negative for syncope.       Denies vertigo   All other systems reviewed and are negative.     Allergies  Lisinopril; Simvastatin; Welchol; and Zetia  Home Medications   Prior to Admission medications   Medication Sig Start Date End Date Taking? Authorizing Provider  aspirin 81 MG tablet Take 81 mg by mouth daily.   Yes Historical Provider, MD  Cyanocobalamin (VITAMIN B-12 IJ) Inject 1 inch as directed. Monthly   Yes Historical Provider, MD  fluticasone (FLONASE) 50 MCG/ACT nasal spray Place 1 spray into both nostrils daily.   Yes Historical Provider, MD  hydrochlorothiazide (HYDRODIURIL) 25 MG tablet Take 12.5 mg by mouth daily.    Yes Historical Provider, MD  levothyroxine (SYNTHROID, LEVOTHROID) 50 MCG tablet Take 50 mcg by mouth daily.    Yes Historical Provider, MD  loratadine (CLARITIN) 10 MG tablet Take 10 mg by mouth daily as needed for allergies.    Yes Historical Provider, MD  losartan (COZAAR) 25 MG tablet Take 25 mg by mouth daily.   Yes Historical Provider, MD   BP 150/75  Pulse 62  Temp(Src) 97.9 F (36.6 C) (Oral)  Resp 18  Ht 5\' 3"  (1.6 m)  Wt 125 lb (56.7 kg)  BMI 22.15 kg/m2  SpO2 100% Physical Exam CONSTITUTIONAL: Well developed/well nourished HEAD: Normocephalic/atraumatic EYES: EOMI/PERRL ENMT: Mucous membranes moist NECK: supple no meningeal signs CV: S1/S2 noted, no murmurs/rubs/gallops noted LUNGS: Lungs are clear to auscultation bilaterally, no apparent distress ABDOMEN: soft, nontender, no rebound or guarding NEURO: Pt is awake/alert, moves all extremitiesx4, no arm or leg drift is noted EXTREMITIES: pulses normal, full ROM, no focal tenderness or deformity to lower extremities SKIN: warm, color normal PSYCH: no abnormalities of mood noted  ED Course  Procedures   9:35 PM D/w cardiology will see patient 11:15 PM Pt to be admitted to cardiology Labs Review Labs Reviewed  BASIC  METABOLIC PANEL - Abnormal; Notable for the following:    GFR calc non Af Amer 60 (*)    GFR calc Af Amer 70 (*)    All other components within normal limits  CBC WITH DIFFERENTIAL  I-STAT TROPOININ, ED      EKG Interpretation   Date/Time:  Friday March 25 2014 17:02:16 EDT Ventricular Rate:  98 PR Interval:  166 QRS Duration: 106 QT Interval:  398 QTC Calculation: 508 R Axis:   12 Text Interpretation:  Normal sinus rhythm Premature ventricular complexes  Prolonged QT Abnormal ECG Confirmed by Christy Gentles  MD, Elenore Rota ([id number]) on  03/25/2014 6:28:20 PM      MDM   Final diagnoses:  Palpitations    Nursing notes including past medical history and social history reviewed and considered in documentation Labs/vital reviewed and considered Previous records reviewed and considered     Sharyon Cable, MD 03/25/14 2316

## 2014-03-25 NOTE — H&P (Addendum)
Physician History and Physical    Michelle Winters MRN: [id number] DOB/AGE: May 19, 1932 80 y.o. Admit date: 03/25/2014  Primary Cardiologist:  Fransico Him, MD  CC:  SVT on event monitor and presyncope  HPI:  Michelle Winters is a 80 y.o. female with a history of HTN, normal coronary arteries on cath, symptomatic PVC's of a RBBB morphology and bradycardia who presents to ER today after event monitor revealed paroxysmal SVT and symptoms of presyncope.   Patient had similar symptoms about a week ago and apparently this has been going on for quite a while since she first had a syncope episode, event monitor was placed about 1 week ago for above reason.   Earlier today event monitor revealed 12 beats of non-sustained SVT at rate 177 with symptoms.  she was dizzy while sweeping back porch. Patient was called in by cardiology to ER.   On inteview, she denied any diaphoresis, nausea or palpitations prior to the event. She states she never had any chest pain with these episodes.   Review of systems: A review of 10 organ systems was done and is negative except as stated above in HPI  Past Medical History  Diagnosis Date  . Thyroid disease   . Heart palpitations   . Hypertension   . Bradycardia   . Osteopenia 06/2012 T score -1.9 FRAX    13%/3.6%  . Abnormal nuclear cardiac imaging test     normal coronary arteries by cath 2014   Past Surgical History  Procedure Laterality Date  . Cholecystectomy    . Dilation and curettage of uterus    . Hysteroscopy    . Tonsillectomy    . Cardiac catheterization      normal coronary arteries   History   Social History  . Marital Status: Married    Spouse Name: [name]    Number of Children: [name]  . Years of Education: [name]   Occupational History  . Not on file.   Social History Main Topics  . Smoking status: Never Smoker   . Smokeless tobacco: Not on file  . Alcohol Use: No  . Drug Use: No  . Sexual Activity: No   Other Topics Concern    . Not on file   Social History Narrative  . No narrative on file    Family History  Problem Relation Age of Onset  . Multiple sclerosis Mother   . Heart attack Father   . Cancer Sister     Skin cancer  . Diabetes Sister   . Nephritis Other   . Leukemia Other      Allergies  Allergen Reactions  . Lisinopril Other (See Comments)    Chest tightness  . Simvastatin Other (See Comments)    Muscle pain  . Welchol [Colesevelam Hcl] Other (See Comments)    Muscle pain   . Zetia [Ezetimibe] Nausea Only      No current facility-administered medications for this encounter.   Current Outpatient Prescriptions  Medication Sig Dispense Refill  . aspirin 81 MG tablet Take 81 mg by mouth daily.      . Cyanocobalamin (VITAMIN B-12 IJ) Inject 1 inch as directed. Monthly      . fluticasone (FLONASE) 50 MCG/ACT nasal spray Place 1 spray into both nostrils daily.      . hydrochlorothiazide (HYDRODIURIL) 25 MG tablet Take 12.5 mg by mouth daily.       [name] levothyroxine (SYNTHROID, LEVOTHROID) 50 MCG tablet Take 50 mcg by mouth daily.       [name]  loratadine (CLARITIN) 10 MG tablet Take 10 mg by mouth daily as needed for allergies.       [name] losartan (COZAAR) 25 MG tablet Take 25 mg by mouth daily.        Physical Exam: Blood pressure 149/75, pulse 57, temperature 97.9 F (36.6 C), temperature source Oral, resp. rate 21, height 5\' 3"  (1.6 m), weight 125 lb (56.7 kg), SpO2 99.00%.; Body mass index is 22.15 kg/(m^2). Temp:  [97.9 F (36.6 C)] 97.9 F (36.6 C) (08/28 1712) Pulse Rate:  [37-82] 57 (08/28 2030) Resp:  [11-22] 21 (08/28 2030) BP: (123-176)/(50-95) 149/75 mmHg (08/28 2030) SpO2:  [98 %-100 %] 99 % (08/28 2030) Weight:  [125 lb (56.7 kg)] 125 lb (56.7 kg) (08/28 1712)  No intake or output data in the 24 hours ending 03/25/14 2108 General: NAD Heent: MMM Neck: No JVD  CV: Nondisplaced PMI.  RRR, nl S1/S2, no S3/S4, no murmur. No carotid bruit   Lungs: Clear to auscultation  bilaterally with normal respiratory effort Abdomen: Soft, nontender, nondistended Extremities: No clubbing or cyanosis.  Normal pedal pulses. No pedal edema Skin: Intact without lesions or rashes  Neurologic: Alert and oriented x 3, grossly nonfocal  Psych: Normal mood and affect    Labs: No results found for this basename: CKTOTAL, CKMB, TROPONINI,  in the last 72 hours Lab Results  Component Value Date   WBC 5.9 03/25/2014   HGB 12.5 03/25/2014   HCT 36.2 03/25/2014   MCV 88.5 03/25/2014   PLT 168 03/25/2014    Recent Labs Lab 03/20/14 0046 03/25/14 1848  NA 142 140  K 3.6* 4.3  CL 105 104  CO2 26 24  BUN 17 19  CREATININE 0.89 0.88  CALCIUM 8.7 8.6  PROT 5.4*  --   BILITOT 0.3  --   ALKPHOS 74  --   ALT 10  --   AST 21  --   GLUCOSE 98 98   No results found for this basename: CHOL, HDL, LDLCALC, TRIG       EKG:  Numerous PVCs with RBBB morphology and ventricular bigeminy, the PVC morphology varies but likely from left basal territory  Echo 04/2013:  Left ventricle: Cannot exclude mild hypokinesis of thebasalinferior myocardium. Doppler parameters areconsistent with abnormal left ventricular relaxation(grade 1 diastolic dysfunction). - Mitral valve: Mild regurgitation   ASSESSMENT:  80 y.o. female with a history of HTN, normal coronary arteries on cath, symptomatic PVC's of a RBBB morphology and bradycardia who presents to ER today after event monitor revealed paroxysmal SVT and symptoms of presyncope.   IMPRESSIONS: 1. Frequent PVCs with variable morphology but mostly left basal origin 2. Paroxysmal SVT per report, unfortunately no print out available at this point.  3. Presyncope likely 2/2 both #1 and #2.   PLAN:  1. Repeat Echocardiogram, re: update EF, this will help for selection of anti-PVC medications if ablation is not an option.  2. EP study, Re: SVT.  3. Low dose Metoprolol with holding criteria, now with HR 70-80s and hypertensive. Uptitrate as  tolerated.  4. Depending on EP study results, if AVNRT or AVRT, would consider ablation. For PVCs, will need to wait for 3 week of monitor complete to eval for burden, if burden high on event monitor, would consider ablation vs medications such as sotalol or amiodarone depending on echo results.  5. We will observe her overnight with telemetry, cycle troponin if her symptoms improves, she can be scheduled for an outpatient EP study/ablation earlier  next week.   Signed: Manus Gunning, MD Cardiology Fellow 03/25/2014, 9:08 PM

## 2014-03-25 NOTE — ED Notes (Signed)
Pt completed eating meal provided.

## 2014-03-25 NOTE — ED Notes (Signed)
MD at bedside. 

## 2014-03-25 NOTE — Telephone Encounter (Signed)
Received message from [facility] of critical EKG on pt.  Had 12 beats of non-sustained SVT at rate 177. They called pt and she states she was dizzy while sweeping back porch.  Had Katie in monitor room to print out reports.  Results did show SVT.  Called pt who states she feels OK right now but did have dizzy spell.  Yesterday readings showed sinus brady. Advised for her to go to [facility] ER.  She will have to get someone to drive her but will go to ER.  Notified Trish and faxed readings to cath lab.

## 2014-03-25 NOTE — ED Notes (Signed)
Report called to unit. 

## 2014-03-25 NOTE — ED Notes (Signed)
Pt was sent here by her doctor. Pt is wearing a halter monitor and was told her HR was 170 and to get to the hospital. sts she was here 1 week ago for the same. Pt denies chest pain, SOB

## 2014-03-25 NOTE — ED Notes (Signed)
Patient given meal bag.

## 2014-03-26 ENCOUNTER — Encounter (HOSPITAL_COMMUNITY): Payer: Self-pay | Admitting: General Practice

## 2014-03-26 DIAGNOSIS — I455 Other specified heart block: Secondary | ICD-10-CM

## 2014-03-26 DIAGNOSIS — I4729 Other ventricular tachycardia: Secondary | ICD-10-CM | POA: Diagnosis not present

## 2014-03-26 DIAGNOSIS — E039 Hypothyroidism, unspecified: Secondary | ICD-10-CM | POA: Diagnosis not present

## 2014-03-26 DIAGNOSIS — I059 Rheumatic mitral valve disease, unspecified: Secondary | ICD-10-CM | POA: Diagnosis not present

## 2014-03-26 DIAGNOSIS — I498 Other specified cardiac arrhythmias: Secondary | ICD-10-CM

## 2014-03-26 DIAGNOSIS — I10 Essential (primary) hypertension: Secondary | ICD-10-CM

## 2014-03-26 DIAGNOSIS — M129 Arthropathy, unspecified: Secondary | ICD-10-CM | POA: Diagnosis present

## 2014-03-26 DIAGNOSIS — Z7982 Long term (current) use of aspirin: Secondary | ICD-10-CM | POA: Diagnosis not present

## 2014-03-26 DIAGNOSIS — R55 Syncope and collapse: Secondary | ICD-10-CM

## 2014-03-26 DIAGNOSIS — I2510 Atherosclerotic heart disease of native coronary artery without angina pectoris: Secondary | ICD-10-CM | POA: Diagnosis present

## 2014-03-26 DIAGNOSIS — I472 Ventricular tachycardia: Secondary | ICD-10-CM | POA: Diagnosis not present

## 2014-03-26 DIAGNOSIS — R002 Palpitations: Secondary | ICD-10-CM

## 2014-03-26 DIAGNOSIS — I4949 Other premature depolarization: Secondary | ICD-10-CM

## 2014-03-26 DIAGNOSIS — I4510 Unspecified right bundle-branch block: Secondary | ICD-10-CM | POA: Diagnosis present

## 2014-03-26 DIAGNOSIS — M899 Disorder of bone, unspecified: Secondary | ICD-10-CM | POA: Diagnosis present

## 2014-03-26 DIAGNOSIS — I495 Sick sinus syndrome: Secondary | ICD-10-CM | POA: Diagnosis present

## 2014-03-26 LAB — CBC
HCT: 34.3 % — ABNORMAL LOW (ref 36.0–46.0)
Hemoglobin: 11.7 g/dL — ABNORMAL LOW (ref 12.0–15.0)
MCH: 30.8 pg (ref 26.0–34.0)
MCHC: 34.1 g/dL (ref 30.0–36.0)
MCV: 90.3 fL (ref 78.0–100.0)
Platelets: 157 10*3/uL (ref 150–400)
RBC: 3.8 MIL/uL — AB (ref 3.87–5.11)
RDW: 12.6 % (ref 11.5–15.5)
WBC: 4.8 10*3/uL (ref 4.0–10.5)

## 2014-03-26 LAB — BASIC METABOLIC PANEL
ANION GAP: 10 (ref 5–15)
BUN: 18 mg/dL (ref 6–23)
CALCIUM: 8.4 mg/dL (ref 8.4–10.5)
CO2: 24 mEq/L (ref 19–32)
Chloride: 106 mEq/L (ref 96–112)
Creatinine, Ser: 0.84 mg/dL (ref 0.50–1.10)
GFR, EST AFRICAN AMERICAN: 74 mL/min — AB (ref >90)
GFR, EST NON AFRICAN AMERICAN: 64 mL/min — AB (ref >90)
Glucose, Bld: 95 mg/dL (ref 70–99)
POTASSIUM: 4.1 meq/L (ref 3.7–5.3)
SODIUM: 140 meq/L (ref 137–147)

## 2014-03-26 LAB — TROPONIN I: Troponin I: <0.3 ng/mL (ref <0.30)

## 2014-03-26 MED ORDER — SODIUM CHLORIDE 0.9 % IV SOLN
INTRAVENOUS | Status: AC
  Administered 2014-03-26: 15:00:00 via INTRAVENOUS

## 2014-03-26 NOTE — Progress Notes (Signed)
Patient refuses her scheduled lopressor and heparin sub q. MD notified. No new orders at this time. Will continue to monitor.

## 2014-03-26 NOTE — Progress Notes (Signed)
UR Completed.  Desi Carby Jane [phone] 03/26/2014  

## 2014-03-26 NOTE — Progress Notes (Addendum)
SUBJECTIVE: Pt lying in bed comfortably. Has had palpitations but no chest pressure or shortness of breath. Had not felt lightheaded/dizzy until she sat upright when I was examining her.     Intake/Output Summary (Last 24 hours) at 03/26/14 1128 Last data filed at 03/26/14 0818  Gross per 24 hour  Intake    480 ml  Output      0 ml  Net    480 ml    Current Facility-Administered Medications  Medication Dose Route Frequency Provider Last Rate Last Dose  . acetaminophen (TYLENOL) tablet 650 mg  650 mg Oral Q4H PRN Manus Gunning, MD      . aspirin chewable tablet 81 mg  81 mg Oral Daily Manus Gunning, MD   81 mg at 03/26/14 1026  . fluticasone (FLONASE) 50 MCG/ACT nasal spray 1 spray  1 spray Each Nare Daily Manus Gunning, MD      . heparin injection 5,000 Units  5,000 Units Subcutaneous 3 times per day Manus Gunning, MD      . hydrochlorothiazide (HYDRODIURIL) tablet 12.5 mg  12.5 mg Oral Daily Manus Gunning, MD   12.5 mg at 03/26/14 1000  . levothyroxine (SYNTHROID, LEVOTHROID) tablet 50 mcg  50 mcg Oral QAC breakfast Manus Gunning, MD   50 mcg at 03/26/14 0704  . loratadine (CLARITIN) tablet 10 mg  10 mg Oral Daily PRN Manus Gunning, MD      . metoprolol tartrate (LOPRESSOR) tablet 12.5 mg  12.5 mg Oral BID Manus Gunning, MD      . ondansetron [facility]) injection 4 mg  4 mg Intravenous Q6H PRN Manus Gunning, MD        Filed Vitals:   03/25/14 2230 03/25/14 2233 03/26/14 0008 03/26/14 0845  BP: 168/90 168/90 116/59 133/60  Pulse:  72 56 60  Temp:   97.6 F (36.4 C)   TempSrc:   Oral   Resp: 25 14 19    Height:      Weight:   132 lb 0.9 oz (59.9 kg)   SpO2:   98%     PHYSICAL EXAM General: NAD HEENT: Normal. Neck: No JVD, no thyromegaly.  Lungs: Clear to auscultation bilaterally with normal respiratory effort. CV: Nondisplaced PMI.  Irregular rhythm, normal rate normal S1/S2, no S3/S4, no murmur.  No pretibial edema. Normal pedal pulses.  Abdomen: Soft, nontender, no hepatosplenomegaly, no distention.  Neurologic:  Alert and oriented x 3.  Psych: Normal affect. Musculoskeletal: Normal range of motion. No gross deformities. Extremities: No clubbing or cyanosis.   TELEMETRY: Reviewed telemetry pt in sinus rhythm with frequent PVC's, occasional PAC's, sinus arrhythmia with 1.8-2 second pauses and short episodes of bradycardia. No SVT.  LABS: Basic Metabolic Panel:  Recent Labs  03/25/14 1848 03/25/14 2306 03/26/14 0400  NA 140  --  140  K 4.3  --  4.1  CL 104  --  106  CO2 24  --  24  GLUCOSE 98  --  95  BUN 19  --  18  CREATININE 0.88 0.83 0.84  CALCIUM 8.6  --  8.4   Liver Function Tests: No results found for this basename: AST, ALT, ALKPHOS, BILITOT, PROT, ALBUMIN,  in the last 72 hours No results found for this basename: LIPASE, AMYLASE,  in the last 72 hours CBC:  Recent Labs  03/25/14 1848 03/25/14 2306 03/26/14 0400  WBC 5.9 4.7 4.8  NEUTROABS 3.9  --   --   HGB 12.5 12.5  11.7*  HCT 36.2 36.1 34.3*  MCV 88.5 87.8 90.3  PLT 168 163 157   Cardiac Enzymes:  Recent Labs  03/25/14 2306 03/26/14 0400 03/26/14 1025  TROPONINI <0.30 <0.30 <0.30   BNP: No components found with this basename: POCBNP,  D-Dimer: No results found for this basename: DDIMER,  in the last 72 hours Hemoglobin A1C: No results found for this basename: HGBA1C,  in the last 72 hours Fasting Lipid Panel: No results found for this basename: CHOL, HDL, LDLCALC, TRIG, CHOLHDL, LDLDIRECT,  in the last 72 hours Thyroid Function Tests:  Recent Labs  03/25/14 2306  TSH 6.150*   Anemia Panel: No results found for this basename: VITAMINB12, FOLATE, FERRITIN, TIBC, IRON, RETICCTPCT,  in the last 72 hours  RADIOLOGY: Dg Chest 2 View  03/19/2014   CLINICAL DATA:  Near syncope.  Weakness.  EXAM: CHEST  2 VIEW  COMPARISON:  02/21/2010  FINDINGS: The heart size and mediastinal contours are within normal limits. Chronic interstitial coarsening noted. No airspace consolidation. The visualized skeletal  structures are unremarkable.  IMPRESSION: No active cardiopulmonary disease.   Electronically Signed   By: Kerby Moors M.D.   On: 03/19/2014 20:26       ASSESSMENT: 80 y.o. female with a history of essential HTN, normal coronary arteries on cath, symptomatic PVC's of a RBBB morphology and bradycardia who presented to ER on 8/28 after event monitor revealed paroxysmal SVT and symptoms of presyncope.   IMPRESSIONS: 1. Frequent PVCs with variable morphology with occasional PAC's, sinus arrhythmia/bradycardia with 1.8-2 second pauses. 2. Paroxysmal SVT per report. 3. Presyncope and continued palpitations and dizziness with changes in position likely 2/2 both #1 and #2.   PLAN:  1. Repeat echocardiogram in order to reassess EF, which will help aid in selection of anti-PVC medications if ablation is not an option.  2. EP study for SVT.  3. Low dose metoprolol with holding criteria, now with variable HR 60-80s. BP now normal. Unable to increase further for aforementioned reasons. 4. Depending on EP study results, if AVNRT or AVRT, would consider ablation. For PVCs, would consider waiting three weeks for monitor completion to evaluate for burden. If burden is high on event monitor, would consider ablation vs medications such as sotalol or amiodarone depending upon echo results.  5. Essential HTN: Presently controlled. 6. Hypothyroidism: TSH mildly elevated at 6.1. On Synthroid 50 mcg daily.  Dispo: Given her continued symptoms of palpitations and dizziness/lightheadedness with changes in position, I think it is best for her to remain an inpatient and potentially undergo an EP study early next week. I will communicate this plan with my EP colleagues.  Kate Sable, M.D., F.A.C.C.  ADDENDUM: Spoke with Dr. Lovena Le (EP). He spoke with Dr. Caryl Comes who was unaware of her admission. I had been told by patient that she was instructed to come to the ED by Dr. Olin Pia nurse. There are no plans for an  EP study on Monday, and it is thus felt she should be able to be discharged over the weekend. I will check orthostatics and also provide IV fluids to attenuate further symptoms.

## 2014-03-27 DIAGNOSIS — I059 Rheumatic mitral valve disease, unspecified: Secondary | ICD-10-CM

## 2014-03-27 MED ORDER — SENNA 8.6 MG PO TABS
1.0000 | ORAL_TABLET | Freq: Every day | ORAL | Status: DC | PRN
  Administered 2014-03-27: 8.6 mg via ORAL
  Filled 2014-03-27: qty 1

## 2014-03-27 NOTE — Progress Notes (Addendum)
Pt up ambulating to bathroom; HR up to 160's SVT; pt c/o dizziness; pt assisted back to bed; vital signs checked; upon lying down HR down to 96; will cont. To monitor.

## 2014-03-27 NOTE — Progress Notes (Signed)
  Echocardiogram 2D Echocardiogram has been performed.  Mauricio Po 03/27/2014, 9:57 AM

## 2014-03-27 NOTE — Progress Notes (Signed)
Patient Profile: 80 y.o. female with a history of essential HTN, normal coronary arteries on cath, symptomatic PVC's of a RBBB morphology and bradycardia who presented to ER on 8/28 after event monitor revealed paroxysmal SVT and symptoms of presyncope.    Subjective: Feels ok. Sitting up in bed eating breakfast. She felt mild chest heaviness earlier this am. Denies associated dyspnea, palpitations, dizziness, syncope/near syncope.   Objective: Vital signs in last 24 hours: Temp:  [97.7 F (36.5 C)-98.4 F (36.9 C)] 97.9 F (36.6 C) (08/30 0529) Pulse Rate:  [51-60] 51 (08/30 0529) Resp:  [18] 18 (08/30 0529) BP: (121-136)/(53-60) 136/58 mmHg (08/30 0529) SpO2:  [99 %] 99 % (08/30 0529) Weight:  [130 lb 4.7 oz (59.1 kg)] 130 lb 4.7 oz (59.1 kg) (08/30 0529) Last BM Date: 03/25/14  Intake/Output from previous day: 08/29 0701 - 08/30 0700 In: 1876 [P.O.:720; I.V.:1156] Out: 1402 [Urine:1402] Intake/Output this shift:    Medications Current Facility-Administered Medications  Medication Dose Route Frequency Provider Last Rate Last Dose  . acetaminophen (TYLENOL) tablet 650 mg  650 mg Oral Q4H PRN Manus Gunning, MD      . aspirin chewable tablet 81 mg  81 mg Oral Daily Manus Gunning, MD   81 mg at 03/26/14 1026  . fluticasone (FLONASE) 50 MCG/ACT nasal spray 1 spray  1 spray Each Nare Daily Manus Gunning, MD      . heparin injection 5,000 Units  5,000 Units Subcutaneous 3 times per day Manus Gunning, MD   5,000 Units at 03/26/14 1500  . hydrochlorothiazide (HYDRODIURIL) tablet 12.5 mg  12.5 mg Oral Daily Manus Gunning, MD   12.5 mg at 03/26/14 1000  . levothyroxine (SYNTHROID, LEVOTHROID) tablet 50 mcg  50 mcg Oral QAC breakfast Manus Gunning, MD   50 mcg at 03/27/14 0607  . loratadine (CLARITIN) tablet 10 mg  10 mg Oral Daily PRN Manus Gunning, MD      . metoprolol tartrate (LOPRESSOR) tablet 12.5 mg  12.5 mg Oral BID Manus Gunning, MD      . ondansetron [facility]) injection 4 mg  4 mg Intravenous Q6H PRN Manus Gunning, MD         PE: General appearance: alert, cooperative and no distress Neck: no carotid bruit and no JVD Lungs: clear to auscultation bilaterally Heart: regular rate and rhythm Extremities: no LEE Pulses: 2+ and symmetric Skin: warm and dry Neurologic: Grossly normal  Lab Results:   Recent Labs  03/25/14 1848 03/25/14 2306 03/26/14 0400  WBC 5.9 4.7 4.8  HGB 12.5 12.5 11.7*  HCT 36.2 36.1 34.3*  PLT 168 163 157   BMET  Recent Labs  03/25/14 1848 03/25/14 2306 03/26/14 0400  NA 140  --  140  K 4.3  --  4.1  CL 104  --  106  CO2 24  --  24  GLUCOSE 98  --  95  BUN 19  --  18  CREATININE 0.88 0.83 0.84  CALCIUM 8.6  --  8.4    Studies/Results: 2D echo pending   Impression: 1. Frequent PVCs with variable morphology with occasional PAC's, sinus arrhythmia/bradycardia with 1.8-2 second pauses.  2. Paroxysmal SVT per report.  3. Abnormal TSH   Plan:  1. ? SSS: Patient has had evidence of SVT with rates in the 170s. She has had bradycardia with pauses up to 2 secs. She has been symptomatic with her arrhthymias/bradycardia. May consider insertion of a PPM to treat her bradycardia and increasing BB therapy  for suppression of her tachyarrhtymias and frequent PVCs.   2. Abnormal TSH: TSH has been elevated, however Free T4 WNL.     LOS: 2 days    Timera Windt M. Rosita Fire, PA-C 03/27/2014 7:20 AM

## 2014-03-27 NOTE — Progress Notes (Signed)
The patient was seen and examined, and I agree with the assessment and plan as documented above, with modifications as noted below. During my exam, pt experienced chest pressure and palpitations. Discussed my conversation with Dr. Lovena Le I had yesterday.  She feels uncertain about going home and is afraid she will be back in the ED.  Attempting to control PSVT with low-dose metoprolol reduces HR to 45-55 bpm range, and she has frequent 1.8-2 second pauses, and has been symptomatic from both these and PVC's. She has not gotten out of bed since being hospitalized. I will have nurse ambulate her to see if this provokes symptoms. Echocardiogram pending. I feel it would be safer to keep her hospitalized for one more day and have her evaluated by EP on 8/31, and the pt prefers this as well.

## 2014-03-28 ENCOUNTER — Encounter (HOSPITAL_COMMUNITY): Payer: Self-pay | Admitting: Internal Medicine

## 2014-03-28 DIAGNOSIS — I472 Ventricular tachycardia: Secondary | ICD-10-CM

## 2014-03-28 DIAGNOSIS — I4729 Other ventricular tachycardia: Secondary | ICD-10-CM

## 2014-03-28 MED ORDER — AMIODARONE HCL 200 MG PO TABS
200.0000 mg | ORAL_TABLET | Freq: Two times a day (BID) | ORAL | Status: DC
  Filled 2014-03-28 (×4): qty 1

## 2014-03-28 NOTE — Consult Note (Signed)
Primary Care Physician: Mathews Argyle, MD Referring Physician:  Dr Cristino Martes Dockstader is a 80 y.o. female with a h/o recurrent palpitations admitted with tachycardia and dizziness.  She has recently been evaluated by Dr Radford Pax and had an event monitor placed.  This documented frequent PVCs.  She is said to have SVT however upon my review of this monitor this am, I do see nonsustained VT but no sustained SVT.  She has also had NSVT on telemetry here with exertion with similar symptoms of dizziness.   She has had prior cath with normal cors.  She has no significant structural abnormality by echo this admission.  She has previously seen Dr Caryl Comes for PVCs and bradycardia.  She did not keep her follow-up with him as he had advised.  She had an episode of syncope 09/25/13 while working in the yard.   She says that she had been having some lightheadedness and the day she passed she became dizzy and sat down and then awakened lying flat on her back on the bank. She denied any diaphoresis, nausea or palpitations prior to the event. She has not had any further syncope since then but has had some mild lightheadedness.  Upon recent evaluated by Dr Radford Pax, event monitor was placed as above.  Today, she denies symptoms of  chest pain, shortness of breath, orthopnea, PND, lower extremity edema,  or neurologic sequela. The patient is tolerating medications without difficulties and is otherwise without complaint today.   Past Medical History  Diagnosis Date  . Heart palpitations   . Hypertension   . Bradycardia   . Osteopenia 06/2012 T score -1.9 FRAX    13%/3.6%  . Abnormal nuclear cardiac imaging test     normal coronary arteries by cath 2014  . Wide-complex tachycardia 03/25/2014  . Hypothyroidism   . Arthritis     "hands, fingers, toes" (03/26/2014)  . Cystitis 1950"s    "tube to bladder was in a kink & I had polyps; surgically corrected"   Past Surgical History  Procedure Laterality  Date  . Cholecystectomy    . Dilation and curettage of uterus    . Hysteroscopy  1950's    "tube to bladder was in a kink & I had polyps; surgically corrected"  . Tonsillectomy    . Cardiac catheterization  06/2013    normal coronary arteries    Current Facility-Administered Medications  Medication Dose Route Frequency Provider Last Rate Last Dose  . acetaminophen (TYLENOL) tablet 650 mg  650 mg Oral Q4H PRN Manus Gunning, MD      . amiodarone (PACERONE) tablet 200 mg  200 mg Oral BID Thompson Grayer, MD      . aspirin chewable tablet 81 mg  81 mg Oral Daily Manus Gunning, MD   81 mg at 03/28/14 1138  . fluticasone (FLONASE) 50 MCG/ACT nasal spray 1 spray  1 spray Each Nare Daily Manus Gunning, MD      . heparin injection 5,000 Units  5,000 Units Subcutaneous 3 times per day Manus Gunning, MD   5,000 Units at 03/28/14 1138  . hydrochlorothiazide (HYDRODIURIL) tablet 12.5 mg  12.5 mg Oral Daily Manus Gunning, MD   12.5 mg at 03/28/14 1137  . levothyroxine (SYNTHROID, LEVOTHROID) tablet 50 mcg  50 mcg Oral QAC breakfast Manus Gunning, MD   50 mcg at 03/28/14 0534  . loratadine (CLARITIN) tablet 10 mg  10 mg Oral Daily PRN Manus Gunning, MD      .  metoprolol tartrate (LOPRESSOR) tablet 12.5 mg  12.5 mg Oral BID Manus Gunning, MD      . ondansetron [facility]) injection 4 mg  4 mg Intravenous Q6H PRN Manus Gunning, MD      . senna [facility]) tablet 8.6 mg  1 tablet Oral Daily PRN Dayna N Dunn, PA-C   8.6 mg at 03/27/14 1251    Allergies  Allergen Reactions  . Lisinopril Other (See Comments)    Chest tightness  . Simvastatin Other (See Comments)    Muscle pain  . Welchol [Colesevelam Hcl] Other (See Comments)    Muscle pain   . Zetia [Ezetimibe] Nausea Only    History   Social History  . Marital Status: Married    Spouse Name: [name]    Number of Children: [name]  . Years of Education: [name]   Occupational History  . Not on file.   Social History Main Topics  . Smoking status: Never Smoker   . Smokeless tobacco: Never Used  . Alcohol Use:  No  . Drug Use: No  . Sexual Activity: No   Other Topics Concern  . Not on file   Social History Narrative  . No narrative on file    Family History  Problem Relation Age of Onset  . Multiple sclerosis Mother   . Heart attack Father   . Cancer Sister     Skin cancer  . Diabetes Sister   . Nephritis Other   . Leukemia Other     ROS- All systems are reviewed and negative except as per the HPI above  Physical Exam: Filed Vitals:   03/27/14 1952 03/28/14 0500 03/28/14 0543 03/28/14 1333  BP: 148/66  138/62 112/57  Pulse: 64  60 72  Temp: 98.1 F (36.7 C)  97.3 F (36.3 C) 98.2 F (36.8 C)  TempSrc: Oral  Oral Oral  Resp: 18  18 18   Height:      Weight:  129 lb 6.6 oz (58.7 kg)    SpO2: 96%  98% 100%    GEN- The patient is elderly appearing, alert and oriented x 3 today.   Head- normocephalic, atraumatic Eyes-  Sclera clear, conjunctiva pink Ears- hearing intact Oropharynx- clear Neck- supple,  Lungs- Clear to ausculation bilaterally, normal work of breathing Heart- Regular rate and rhythm with frequent ectopy, no murmurs, rubs or gallops, PMI not laterally displaced GI- soft, NT, ND, + BS Extremities- no clubbing, cyanosis, or edema MS- no significant deformity or atrophy Skin- no rash or lesion Psych- euthymic mood, full affect Neuro- strength and sensation are intact  EKGs are reviewed Event monitor is also reviewed Echo is reviewed Epic records are reviewed in detial  Assessment and Plan:   1. Palpitations, PVCs, nonsustained VT The patient appears to have very frequent ventricular ectopy.  She has a structurally normal heart and prior cath which showed no CAD.  I have reviewed her event monitor (as above) and do not see SVT documented. GIven her advanced age, I would favor medical management.  Her bradycardia limits our treatment options.  At this point, I would advise amiodarone therapy.  Risks, benefits, and alternatives to amiodarone were discussed  in detail with the patient today.  She understands and wishes to proceed with this medicine. I will start amiodarone 200mg  BID today.  This can be decreased to 200mg  daily at discharge. She has nocturnal but asymptomatic bradycardia noted.  We will follow this conservatively  If she does well overnight then she can  discharge tomorrow with close outpatient follow-up by her primary cardiologist.

## 2014-03-29 ENCOUNTER — Encounter (HOSPITAL_COMMUNITY): Payer: Self-pay | Admitting: Physician Assistant

## 2014-03-29 MED ORDER — AMIODARONE HCL 200 MG PO TABS
200.0000 mg | ORAL_TABLET | Freq: Every day | ORAL | Status: DC
  Administered 2014-03-29: 200 mg via ORAL
  Filled 2014-03-29: qty 1

## 2014-03-29 MED ORDER — METOPROLOL TARTRATE 12.5 MG HALF TABLET: 12.5000 mg | ORAL_TABLET | Freq: Two times a day (BID) | ORAL | Status: DC

## 2014-03-29 MED ORDER — AMIODARONE HCL 200 MG PO TABS: 200.0000 mg | ORAL_TABLET | Freq: Every day | ORAL | Status: DC

## 2014-03-29 NOTE — Progress Notes (Signed)
Amio PO given per Dr. Rayann Heman order, pt HR 43 at time of administration, will continue to monitor closely Rickard Rhymes, RN

## 2014-03-29 NOTE — Discharge Instructions (Signed)
Amiodarone tablets -You need baseline lung function testing. This can be done at any lung doctor.  -You need yearly eye exams.   What is this medicine? AMIODARONE (a MEE oh da rone) is an antiarrhythmic drug. It helps make your heart beat regularly. Because of the side effects caused by this medicine, it is only used when other medicines have not worked. It is usually used for heartbeat problems that may be life threatening. This medicine may be used for other purposes; ask your health care provider or pharmacist if you have questions. COMMON BRAND NAME(S): Cordarone, Pacerone What should I tell my health care provider before I take this medicine? They need to know if you have any of these conditions: -liver disease -lung disease -other heart problems -thyroid disease -an unusual or allergic reaction to amiodarone, iodine, other medicines, foods, dyes, or preservatives -pregnant or trying to get pregnant -breast-feeding How should I use this medicine? Take this medicine by mouth with a glass of water. Follow the directions on the prescription label. You can take this medicine with or without food. However, you should always take it the same way each time. Take your doses at regular intervals. Do not take your medicine more often than directed. Do not stop taking except on the advice of your doctor or health care professional. A special MedGuide will be given to you by the pharmacist with each prescription and refill. Be sure to read this information carefully each time. Talk to your pediatrician regarding the use of this medicine in children. Special care may be needed. Overdosage: If you think you have taken too much of this medicine contact a poison control center or emergency room at once. NOTE: This medicine is only for you. Do not share this medicine with others. What if I miss a dose? If you miss a dose, take it as soon as you can. If it is almost time for your next dose, take only that  dose. Do not take double or extra doses. What may interact with this medicine? Do not take this medicine with any of the following medications: -abarelix -apomorphine -arsenic trioxide -certain antibiotics like erythromycin, gemifloxacin, levofloxacin, pentamidine -certain medicines for depression like amoxapine, tricyclic antidepressants -certain medicines for fungal infections like fluconazole, itraconazole, ketoconazole, posaconazole, voriconazole -certain medicines for irregular heart beat like disopyramide, dofetilide, dronedarone, ibutilide, propafenone, sotalol -certain medicines for malaria like chloroquine, halofantrine -cisapride -droperidol -haloperidol -hawthorn -maprotiline -methadone -phenothiazines like chlorpromazine, mesoridazine, thioridazine -pimozide -ranolazine -red yeast rice -vardenafil -ziprasidone This medicine may also interact with the following medications: -antiviral medicines for HIV or AIDS -certain medicines for blood pressure, heart disease, irregular heart beat -certain medicines for cholesterol like atorvastatin, cerivastatin, lovastatin, simvastatin -certain medicines for hepatitis C like sofosbuvir and ledipasvir; sofosbuvir -certain medicines for seizures like phenytoin -certain medicines for thyroid problems -certain medicines that treat or prevent blood clots like warfarin -cholestyramine -cimetidine -clopidogrel -cyclosporine -dextromethorphan -diuretics -fentanyl -general anesthetics -grapefruit juice -lidocaine -loratadine -methotrexate -other medicines that prolong the QT interval (cause an abnormal heart rhythm) -procainamide -quinidine -rifabutin, rifampin, or rifapentine -St. John's Wort -trazodone This list may not describe all possible interactions. Give your health care provider a list of all the medicines, herbs, non-prescription drugs, or dietary supplements you use. Also tell them if you smoke, drink alcohol, or use  illegal drugs. Some items may interact with your medicine. What should I watch for while using this medicine? Your condition will be monitored closely when you first begin therapy. Often, this drug  is first started in a hospital or other monitored health care setting. Once you are on maintenance therapy, visit your doctor or health care professional for regular checks on your progress. Because your condition and use of this medicine carry some risk, it is a good idea to carry an identification card, necklace or bracelet with details of your condition, medications, and doctor or health care professional. Dennis Bast may get drowsy or dizzy. Do not drive, use machinery, or do anything that needs mental alertness until you know how this medicine affects you. Do not stand or sit up quickly, especially if you are an older patient. This reduces the risk of dizzy or fainting spells. This medicine can make you more sensitive to the sun. Keep out of the sun. If you cannot avoid being in the sun, wear protective clothing and use sunscreen. Do not use sun lamps or tanning beds/booths. You should have regular eye exams before and during treatment. Call your doctor if you have blurred vision, see halos, or your eyes become sensitive to light. Your eyes may get dry. It may be helpful to use a lubricating eye solution or artificial tears solution. If you are going to have surgery or a procedure that requires contrast dyes, tell your doctor or health care professional that you are taking this medicine. What side effects may I notice from receiving this medicine? Side effects that you should report to your doctor or health care professional as soon as possible: -allergic reactions like skin rash, itching or hives, swelling of the face, lips, or tongue -blue-gray coloring of the skin -blurred vision, seeing blue green halos, increased sensitivity of the eyes to light -breathing problems -chest pain -dark urine -fast, irregular  heartbeat -feeling faint or light-headed -intolerance to heat or cold -nausea or vomiting -pain and swelling of the scrotum -pain, tingling, numbness in feet, hands -redness, blistering, peeling or loosening of the skin, including inside the mouth -spitting up blood -stomach pain -sweating -unusual or uncontrolled movements of body -unusually weak or tired -weight gain or loss -yellowing of the eyes or skin Side effects that usually do not require medical attention (report to your doctor or health care professional if they continue or are bothersome): -change in sex drive or performance -constipation -dizziness -headache -loss of appetite -trouble sleeping This list may not describe all possible side effects. Call your doctor for medical advice about side effects. You may report side effects to FDA at 1-800-FDA-1088. Where should I keep my medicine? Keep out of the reach of children. Store at room temperature between 20 and 25 degrees C (68 and 77 degrees F). Protect from light. Keep container tightly closed. Throw away any unused medicine after the expiration date. NOTE: This sheet is a summary. It may not cover all possible information. If you have questions about this medicine, talk to your doctor, pharmacist, or health care provider.  2015, Elsevier/Gold Standard. (2013-10-18 19:48:11)

## 2014-03-29 NOTE — Discharge Summary (Signed)
Discharge Summary   Patient ID: Michelle Winters MRN: [id number], DOB/AGE: 01-Nov-1931 80 y.o. Admit date: 03/25/2014 D/C date:     03/29/2014  Primary Care Provider: Mathews Argyle, MD Primary Cardiologist: Dr. Radford Pax, MD Primary Electrophysiologist: Dr. Caryl Comes, MD   Primary Discharge Diagnoses:  1. Palpitations/paroxysmal SVT/PVCs/NSVT 2. Nonobstructive CAD by cath 2014 3. Bradycardia 4. HTN  Secondary Discharge Diagnoses:  1. Hypothyroidism 2. Osteopenia 3. Arthritis   Hospital Course: 80 y.o. female with a PMHx of HTN, normal coronary arteries by cath in 2014, symptomatic PVC's of a RBBB morphology, SVT, NSVT, bradycardia, and hypothyroidism who presented to [facility] 03/25/14 after event monitor revealed paroxysmal SVT coupled with patient having symptoms of presyncope. Patient had similar symptoms about a week prior to admission and apparently this has been going on for quite a while since she first had a syncopal episode, event monitor was placed about 1 week ago for above reason.   On 8/28 event monitor revealed 12 beats of non-sustained SVT at rate 177 with symptoms. She was dizzy while sweeping back porch. Patient was called in by cardiology to [facility] ED.  On inteview, she denied any diaphoresis, nausea or palpitations prior to the event. She states she never had any chest pain with these episodes. She was started on metoprolol 12.5 mg bid with hold criteria 2/2 bradycardia. Echo showed EF 50-55%, AK of the basalinferior myocardium, possible HK of the apicallateral and inferolateral myocardium, mild Ao regurg, mild MR, mild LAE, trivial pulmonic regurg. On telemetry she remained in sinus arrhythmia/bradycardia with pauses up to 2 secs on 8/30. She was seen by Dr. Rayann Heman on for EP consultation on 8/31 who reviewed her event monitor and did not see any SVT. He recommended, given her advanced age, favoring medical management. Her bradycardia limits our treatment options at this time however. He  advised amiodarone therapy, starting with 200 mg bid on 8/31 with the plan to decrease to 200 mg daily at discharge. She has nocturnal but asymptomatic bradycardia noted while inpatient. She did well overnight from 8/31-9/1. Her labs were as follows: troponin negative x 3, K+ 4.1, SCr 0.84, hgb 11.7 (baseline low 12), TSH 6.1 (baseline 6.9-7.2), free T4 1.11, Mg 1.7. She will discharged on her home medications including amiodarone 200 mg daily. She has refused metoprolol throughout her admission and documented HR have been 40-50. She declines this medication at discharge. Risks were discussed with her. She was seen by Dr. Rayann Heman today who felt she was stable and ready for discharge.   Consults: EP  Discharge Vitals: Blood pressure 101/70, pulse 56, temperature 97.7 F (36.5 C), temperature source Oral, resp. rate 18, height 5\' 3"  (1.6 m), weight 133 lb 9.6 oz (60.6 kg), SpO2 100.00%.  Labs: Lab Results  Component Value Date   WBC 4.8 03/26/2014   HGB 11.7* 03/26/2014   HCT 34.3* 03/26/2014   MCV 90.3 03/26/2014   PLT 157 03/26/2014     Recent Labs Lab 03/26/14 0400  NA 140  K 4.1  CL 106  CO2 24  BUN 18  CREATININE 0.84  CALCIUM 8.4  GLUCOSE 95   No results found for this basename: CKTOTAL, CKMB, TROPONINI,  in the last 72 hours No results found for this basename: CHOL,  HDL,  LDLCALC,  TRIG   No results found for this basename: DDIMER    Diagnostic Studies/Procedures   Dg Chest 2 View  03/19/2014   CLINICAL DATA:  Near syncope.  Weakness.  EXAM: CHEST  2 VIEW  COMPARISON:  02/21/2010  FINDINGS: The heart size and mediastinal contours are within normal limits. Chronic interstitial coarsening noted. No airspace consolidation. The visualized skeletal structures are unremarkable.  IMPRESSION: No active cardiopulmonary disease.   Electronically Signed   By: Kerby Moors M.D.   On: 03/19/2014 20:26    Discharge Medications   Current Discharge Medication List    START taking  these medications   Details  amiodarone (PACERONE) 200 MG tablet Take 1 tablet (200 mg total) by mouth daily. Qty: 30 tablet, Refills: 1      CONTINUE these medications which have NOT CHANGED   Details  aspirin 81 MG tablet Take 81 mg by mouth daily.    Cyanocobalamin (VITAMIN B-12 IJ) Inject 1 inch as directed. Monthly    fluticasone (FLONASE) 50 MCG/ACT nasal spray Place 1 spray into both nostrils daily.    hydrochlorothiazide (HYDRODIURIL) 25 MG tablet Take 12.5 mg by mouth daily.     levothyroxine (SYNTHROID, LEVOTHROID) 50 MCG tablet Take 50 mcg by mouth daily.     loratadine (CLARITIN) 10 MG tablet Take 10 mg by mouth daily as needed for allergies.     losartan (COZAAR) 25 MG tablet Take 25 mg by mouth daily.        Disposition   The patient will be discharged in stable condition to home. Discharge Instructions   Diet - low sodium heart healthy    Complete by:  As directed      Increase activity slowly    Complete by:  As directed           Follow-up Information   Follow up with Virl Axe, MD On 04/28/2014. (APPT: 3:15 PM)    Specialty:  Cardiology   Contact information:   [address] [address] [phone]         Duration of Discharge Encounter: Greater than 30 minutes including physician and PA time.  Melvern Banker PA-C 03/29/2014, 12:18 PM  Thompson Grayer MD

## 2014-03-29 NOTE — Progress Notes (Signed)
Discharge education, medication, prescriptions, and follow-up appts reviewed with pt, pt stated understanding and that she had no questions, Amio education given, family aware of discharge Rickard Rhymes, RN

## 2014-03-29 NOTE — Progress Notes (Addendum)
   SUBJECTIVE: The patient is doing well today.  She did not take amiodarone yesterday as she wanted to discuss it further.  At this time, she denies chest pain, shortness of breath, or any new concerns.    [name] amiodarone  200 mg Oral Daily  . aspirin  81 mg Oral Daily  . fluticasone  1 spray Each Nare Daily  . heparin  5,000 Units Subcutaneous 3 times per day  . hydrochlorothiazide  12.5 mg Oral Daily  . levothyroxine  50 mcg Oral QAC breakfast  . metoprolol tartrate  12.5 mg Oral BID      OBJECTIVE: Physical Exam: Filed Vitals:   03/28/14 0500 03/28/14 0543 03/28/14 1333 03/29/14 0605  BP:  138/62 112/57 146/67  Pulse:  60 72 56  Temp:  97.3 F (36.3 C) 98.2 F (36.8 C) 97.7 F (36.5 C)  TempSrc:  Oral Oral Oral  Resp:  18 18 18   Height:      Weight: 129 lb 6.6 oz (58.7 kg)   133 lb 9.6 oz (60.6 kg)  SpO2:  98% 100% 100%    Intake/Output Summary (Last 24 hours) at 03/29/14 0750 Last data filed at 03/28/14 1843  Gross per 24 hour  Intake    380 ml  Output      0 ml  Net    380 ml    Telemetry reveals sinus rhythm, rare pvcs, NSVT is much better  GEN- The patient is elderly appearing, alert and oriented x 3 today.  Head- normocephalic, atraumatic  Eyes- Sclera clear, conjunctiva pink  Ears- hearing intact  Oropharynx- clear  Neck- supple,  Lungs- Clear to ausculation bilaterally, normal work of breathing  Heart- Regular rate and rhythm with frequent ectopy, no murmurs, rubs or gallops, PMI not laterally displaced  GI- soft, NT, ND, + BS  Extremities- no clubbing, cyanosis, or edema  MS- no significant deformity or atrophy  Skin- no rash or lesion  Psych- euthymic mood, full affect  Neuro- strength and sensation are intact Cardiac Enzymes:  Recent Labs  03/26/14 1025  TROPONINI <0.30    RADIOLOGY: Dg Chest 2 View  03/19/2014   CLINICAL DATA:  Near syncope.  Weakness.  EXAM: CHEST  2 VIEW  COMPARISON:  02/21/2010  FINDINGS: The heart size and mediastinal  contours are within normal limits. Chronic interstitial coarsening noted. No airspace consolidation. The visualized skeletal structures are unremarkable.  IMPRESSION: No active cardiopulmonary disease.   Electronically Signed   By: Kerby Moors M.D.   On: 03/19/2014 20:26    ASSESSMENT AND PLAN:   1. Palpitations, PVCs, nonsustained VT  Improved GIven her advanced age, I would favor medical management. Her bradycardia limits our treatment options. At this point, I would advise amiodarone therapy. Risks, benefits, and alternatives to amiodarone were discussed again in detail with the patient today. She understands and is now willing to proceed with this medicine. I will start amiodarone 200mg  daily today.   She has nocturnal but asymptomatic bradycardia noted. We will follow this conservatively  DC to home today She will follow closely in the office with Drs Caryl Comes and Irving Copas, MD 03/29/2014 7:50 AM

## 2014-03-29 NOTE — Progress Notes (Signed)
Pt refused amiodarone at 22:00. Pt states she would like to talk to her cardiologist about the medication and receive more education. Pt is resting in bed with call bell within reach. Will continue to monitor.  Valarie Merino, RN

## 2014-03-31 NOTE — Care Management Note (Signed)
    Page 1 of 1   03/31/2014     11:17:55 AM CARE MANAGEMENT NOTE 03/31/2014  Patient:  Michelle Winters, Michelle Winters   Account Number:  [id number]  Date Initiated:  03/28/2014  Documentation initiated by:  Nashaly Dorantes  Subjective/Objective Assessment:   Pt adm on 03/25/14 with SVT/bradycardia.  PTA, pt independent, lives with husband.     Action/Plan:   Will follow for dc needs as pt progresses.   Anticipated DC Date:  03/29/2014   Anticipated DC Plan:  [place]  CM consult      Choice offered to / List presented to:             Status of service:  Completed, signed off Medicare Important Message given?  YES (If response is "NO", the following Medicare IM given date fields will be blank) Date Medicare IM given:  03/28/2014 Medicare IM given by:  Anajulia Leyendecker Date Additional Medicare IM given:   Additional Medicare IM given by:    Discharge Disposition:  HOME/SELF CARE  Per UR Regulation:  Reviewed for med. necessity/level of care/duration of stay  If discussed at [place] of Stay Meetings, dates discussed:    Comments:

## 2014-04-05 DIAGNOSIS — E538 Deficiency of other specified B group vitamins: Secondary | ICD-10-CM | POA: Diagnosis not present

## 2014-04-08 ENCOUNTER — Telehealth: Payer: Self-pay | Admitting: Internal Medicine

## 2014-04-08 NOTE — Telephone Encounter (Signed)
Patient describes being extremely tired and weak since starting Amiodarone. She has no energy at all to do anything. I explained Dr. Caryl Comes will be out of the office until 9/21, but that I could review it with Dr. Rayann Heman on Monday (she saw him in the hospital), and she is agreeable/prefers this. We agreed that I would call her back Monday after speaking with Dr. Rayann Heman.

## 2014-04-08 NOTE — Telephone Encounter (Signed)
New message    Patient calling in regarding to medication C/O side effect.

## 2014-04-11 NOTE — Telephone Encounter (Signed)
Dr. Rayann Heman reviewed - recommendations to f/u with an extender this week or Dr. Caryl Comes next week. Pt scheduled to see Richardson Dopp, PA tomorrow.

## 2014-04-12 ENCOUNTER — Encounter: Payer: Self-pay | Admitting: Physician Assistant

## 2014-04-12 ENCOUNTER — Ambulatory Visit (INDEPENDENT_AMBULATORY_CARE_PROVIDER_SITE_OTHER): Payer: Medicare Other | Admitting: Physician Assistant

## 2014-04-12 VITALS — BP 168/58 | HR 45 | Ht 63.0 in | Wt 133.0 lb

## 2014-04-12 DIAGNOSIS — I493 Ventricular premature depolarization: Secondary | ICD-10-CM

## 2014-04-12 DIAGNOSIS — I4949 Other premature depolarization: Secondary | ICD-10-CM | POA: Diagnosis not present

## 2014-04-12 DIAGNOSIS — I4729 Other ventricular tachycardia: Secondary | ICD-10-CM | POA: Diagnosis not present

## 2014-04-12 DIAGNOSIS — I498 Other specified cardiac arrhythmias: Secondary | ICD-10-CM

## 2014-04-12 DIAGNOSIS — I472 Ventricular tachycardia: Secondary | ICD-10-CM

## 2014-04-12 DIAGNOSIS — R001 Bradycardia, unspecified: Secondary | ICD-10-CM

## 2014-04-12 DIAGNOSIS — I10 Essential (primary) hypertension: Secondary | ICD-10-CM

## 2014-04-12 NOTE — Progress Notes (Signed)
Cardiology Office Note    Date:  04/12/2014   ID:  Michelle Winters, DOB 1932-03-18, MRN [id number]  PCP:  Mathews Argyle, MD  Cardiologist:  Dr. Fransico Him   Electrophysiologist:  Dr. Virl Axe    History of Present Illness: Michelle Winters is a 80 y.o. female with a hx of HTN, normal coronary arteries by LHC in 2014, symptomatic PVCs (RBBB morphology), SVT, NSVT, bradycardia, hypothyroidism.  Patient was seen by Dr. Fransico Him in 11/2013 for syncope.  Referral back to EP for loop recorder was recommended.  But, she declined.  Admitted 8/22-8/24 with near syncope.  Event monitor was placed.  She was readmitted 8/28-9/1 with symptomatic PSVT noted on her event monitor.  Tele demonstrated NSR, bradycardia and pauses up to 2 secs.  Review of event monitor by EP did not demonstrate SVT.  NSVT was noted and frequent ventricular ectopy.  Amiodarone was recommended.  Patient declined starting beta blocker therapy (she notes significant bradycardia in the past with this).  She called in recently with increased fatigue.  Event monitor in place demonstrates profound bradycardia with HR in the 20s.  However this was during HS.  She denies dizziness or near syncope.  She denies syncope.  She denies chest pain, dyspnea, orthopnea, PND, edema.        Studies:  - LHC (12/14):  Normal coronary arteries, EF 50-55%  - Echo (8/15):  EF 50-55%, inf AK, apical lateral and inf-lat HK, trivial AI, mild MR, mild LAE.    - Nuclear (10/14):  Ant defect, EF 49%; Int risk   Recent Labs/Images: 03/20/2014: ALT 10  03/25/2014: TSH 6.150*  03/26/2014: Creatinine 0.84; Hemoglobin 11.7*; Potassium 4.1   No results found.   Wt Readings from Last 3 Encounters:  04/12/14 133 lb (60.328 kg)  03/29/14 133 lb 9.6 oz (60.6 kg)  03/19/14 124 lb 6.4 oz (56.427 kg)     Past Medical History  Diagnosis Date  . Heart palpitations     a. paroxysmal SVT, PVCs, NSVT. b. echo 03/27/14: EF 70-26%, nl systolic  function, AK of basalinferior myocardium, possible HK of the apicallateral and inferolateral myocardium, Ao trivial regurg, mild MR, mild LAE, trivial pulmonic regurg  . Hypertension   . Bradycardia   . Osteopenia 06/2012 T score -1.9 FRAX    13%/3.6%  . CAD (coronary artery disease)     a. cath 07/01/13: LM no obs dz, LAD widely patent no obs dz, LCx normal in appearance, RCA  small, nondominant vessel. Catheter-induced spasm at the ostium, EF 50-55%  . Wide-complex tachycardia 03/25/2014  . Hypothyroidism   . Arthritis     "hands, fingers, toes" (03/26/2014)  . Cystitis 1950"s    "tube to bladder was in a kink & I had polyps; surgically corrected"    Current Outpatient Prescriptions  Medication Sig Dispense Refill  . amiodarone (PACERONE) 200 MG tablet Take 1 tablet (200 mg total) by mouth daily.  30 tablet  1  . aspirin 81 MG tablet Take 81 mg by mouth daily.      . Cyanocobalamin (VITAMIN B-12 IJ) Inject 1 inch as directed. Monthly      . fluticasone (FLONASE) 50 MCG/ACT nasal spray Place 1 spray into both nostrils daily.      . hydrochlorothiazide (HYDRODIURIL) 25 MG tablet Take 12.5 mg by mouth daily.       [name] levothyroxine (SYNTHROID, LEVOTHROID) 50 MCG tablet Take 50 mcg by mouth daily.       [name]  loratadine (CLARITIN) 10 MG tablet Take 10 mg by mouth daily as needed for allergies.       [name] losartan (COZAAR) 25 MG tablet Take 25 mg by mouth daily.       No current facility-administered medications for this visit.     Allergies:   Lisinopril; Simvastatin; Welchol; and Zetia   Social History:  The patient  reports that she has never smoked. She has never used smokeless tobacco. She reports that she does not drink alcohol or use illicit drugs.   Family History:  The patient's family history includes Cancer in her sister; Diabetes in her sister; Heart attack in her father; Leukemia in her other; Multiple sclerosis in her mother; Nephritis in her other.   ROS:  Please see the history  of present illness.      All other systems reviewed and negative.   PHYSICAL EXAM: VS:  BP 168/58  Pulse 45  Ht 5\' 3"  (1.6 m)  Wt 133 lb (60.328 kg)  BMI 23.57 kg/m2 Well nourished, well developed, in no acute distress HEENT: normal Neck: no JVD Cardiac:  normal S1, S2; RRR; no murmur Lungs:  clear to auscultation bilaterally, no wheezing, rhonchi or rales Abd: soft, nontender, no hepatomegaly Ext: no edema Skin: warm and dry Neuro:  CNs 2-12 intact, no focal abnormalities noted  EKG:  Junctional bradycardia, HR 45      ASSESSMENT AND PLAN:  1. Bradycardia:  EKG demonstrates junctional bradycardia.  She is symptomatic with significant fatigue.  She is not on any other rate slowing medications.  I will DC the Amiodarone.  She will need to continue to wear her event monitor and FU with Dr. Virl Axe as planned.  2. PVC's (premature ventricular contraction) and NSVT (nonsustained ventricular tachycardia):  I am not certain what other medications can be used for her.  She has no CAD.  Question if Flecainide is worth attempting.  Defer this to EP.  As noted, she will need to keep her FU with Dr. Virl Axe as planned 10/1. She is a very active and independent Octogenarian.  If she meets criteria for PPM implantation, I think she would be a good candidate.  3. Essential hypertension:  BP elevated today.  Will continue to monitor.  Disposition:  FU with Dr. Virl Axe 10/1 as planned.    Signed, Versie Starks, MHS 04/12/2014 3:30 PM    [facility] [address], [place], [place]  [address] Phone: [phone]; Fax: [phone]

## 2014-04-12 NOTE — Patient Instructions (Signed)
STOP THE AMIODARONE TODAY PER SCOTT WEAVER, PAC  KEEP YOUR APPT 04/28/2014 WITH DR. Caryl Comes

## 2014-04-28 ENCOUNTER — Ambulatory Visit (INDEPENDENT_AMBULATORY_CARE_PROVIDER_SITE_OTHER): Payer: Medicare Other | Admitting: Internal Medicine

## 2014-04-28 ENCOUNTER — Encounter: Payer: Self-pay | Admitting: Internal Medicine

## 2014-04-28 VITALS — BP 130/60 | HR 61 | Ht 63.0 in | Wt 131.2 lb

## 2014-04-28 DIAGNOSIS — R55 Syncope and collapse: Secondary | ICD-10-CM | POA: Diagnosis not present

## 2014-04-28 DIAGNOSIS — R001 Bradycardia, unspecified: Secondary | ICD-10-CM

## 2014-04-28 DIAGNOSIS — R000 Tachycardia, unspecified: Secondary | ICD-10-CM

## 2014-04-28 DIAGNOSIS — I493 Ventricular premature depolarization: Secondary | ICD-10-CM | POA: Diagnosis not present

## 2014-04-28 DIAGNOSIS — I472 Ventricular tachycardia: Secondary | ICD-10-CM

## 2014-04-28 NOTE — Progress Notes (Signed)
Patient Care Team: Lajean Manes, MD as PCP - General (Internal Medicine)   HPI  Michelle Winters is a 80 y.o. female  Seen in followup for symptomatic PVCs with multiple morphologies. She underwent Myoview scanning which was abnormal and underwent catheterization which demonstrated normal coronary arteries. She been seen by Richardson Dopp for palpitations and was given an event recorder. This demonstrated nonsustained SVT. She was seen by Dr. Greggory Brandy for symptomatic nonsustained SVT and he recommended amiodarone.  Unfortunately, this was complicated by significant and symptomatic bradycardia with heart rates in the 20s.   She continues to feel sluggish and comes in today with heart rates in the 40s   we reviewed her electrocardiogram an event recorder and she has rates in the 180s as well as rates into the 30s and 40s.     Echocardiogram demonstrated EF of 50-55% motion abnormalities.      Past Medical History  Diagnosis Date  . Heart palpitations     a. paroxysmal SVT, PVCs, NSVT. b. echo 03/27/14: EF 23-55%, nl systolic function, AK of basalinferior myocardium, possible HK of the apicallateral and inferolateral myocardium, Ao trivial regurg, mild MR, mild LAE, trivial pulmonic regurg  . Hypertension   . Bradycardia   . Osteopenia 06/2012 T score -1.9 FRAX    13%/3.6%  . CAD (coronary artery disease)     a. cath 07/01/13: LM no obs dz, LAD widely patent no obs dz, LCx normal in appearance, RCA  small, nondominant vessel. Catheter-induced spasm at the ostium, EF 50-55%  . Wide-complex tachycardia 03/25/2014  . Hypothyroidism   . Arthritis     "hands, fingers, toes" (03/26/2014)  . Cystitis 1950"s    "tube to bladder was in a kink & I had polyps; surgically corrected"    Past Surgical History  Procedure Laterality Date  . Cholecystectomy    . Dilation and curettage of uterus    . Hysteroscopy  1950's    "tube to bladder was in a kink & I had polyps; surgically corrected"    . Tonsillectomy    . Cardiac catheterization  06/2013    normal coronary arteries    Current Outpatient Prescriptions  Medication Sig Dispense Refill  . aspirin 81 MG tablet Take 81 mg by mouth daily.      . Cyanocobalamin (VITAMIN B-12 IJ) Inject 1 inch as directed. Monthly      . fluticasone (FLONASE) 50 MCG/ACT nasal spray Place 1 spray into both nostrils as needed for allergies.       . hydrochlorothiazide (HYDRODIURIL) 25 MG tablet Take 12.5 mg by mouth daily.       [name] levothyroxine (SYNTHROID, LEVOTHROID) 50 MCG tablet Take 50 mcg by mouth daily.       [name] loratadine (CLARITIN) 10 MG tablet Take 10 mg by mouth daily as needed for allergies.       [name] losartan (COZAAR) 25 MG tablet Take 25 mg by mouth daily.       No current facility-administered medications for this visit.    Allergies  Allergen Reactions  . Lisinopril Other (See Comments)    Chest tightness  . Simvastatin Other (See Comments)    Muscle pain  . Welchol [Colesevelam Hcl] Other (See Comments)    Muscle pain   . Amiodarone Other (See Comments)    Lowered heart rate   . Zetia [Ezetimibe] Nausea Only    Review of Systems negative except from HPI and PMH  Physical Exam BP  130/60  Pulse 61  Ht 5\' 3"  (1.6 m)  Wt 131 lb 3.2 oz (59.512 kg)  BMI 23.25 kg/m2 Well developed and well nourished in no acute distress HENT normal E scleral and icterus clear Neck Supple JVP flat; carotids brisk and full Clear to ausculation Irregular rate and rhythm, consistent with PVCs no murmurs gallops or rub Soft with active bowel sounds No clubbing cyanosis none Edema Alert and oriented, grossly normal motor and sensory function Skin Warm and Dry    Assessment and  Plan  Wide-complex tachycardia presumed SVT  Sinus node dysfunction with bradycardia  PVCs with functional bradycardia  The patient has tachybradycardia syndrome with symptomatic tachycardia as well symptomatic bradycardia. She will need backup  bradycardia pacing which will allow for rhythm control and rate control with strategies.  The benefits and risks were reviewed including but not limited to death,  perforation, infection, lead dislodgement and device malfunction.  The patient understands agrees and is willing to proceed.

## 2014-04-28 NOTE — Patient Instructions (Addendum)
Your physician recommends that you continue on your current medications as directed. Please refer to the Current Medication list given to you today.  Your physician has recommended that you have a pacemaker inserted. A pacemaker is a small device that is placed under the skin of your chest or abdomen to help control abnormal heart rhythms. This device uses electrical pulses to prompt the heart to beat at a normal rate. Pacemakers are used to treat heart rhythms that are too slow. Wire (leads) are attached to the pacemaker that goes into the chambers of you heart. This is done in the hospital and usually requires and overnight stay.    Yoltzin Barg, RN will call you to arrange this procedure.  [phone]   Thank you for choosing [facility]!!       Pacemaker Implantation The heart has its own electrical system, or natural pacemaker, to regulate the heartbeat. Sometimes, the natural pacemaker system of the heart fails and causes the heart to beat too slowly. If this happens, a pacemaker can be surgically placed to help the heart beat at a normal or programmed rate. A pacemaker is a small, battery-powered device that is placed under the skin and is programmed to sense your heartbeats. If your heart rate is lower than the programmed rate, the pacemaker will pace your heart. Parts of a pacemaker include:  Wires or leads. The leads are placed in the heart and transmit electricity to the heart. The leads are connected to the pulse generator.  Pulse generator. The pulse generator contains a computer and a memory system. The pulse generator also produces the electrical signal that triggers the heart to beat. A pacemaker may be placed if:  You have a slow heartbeat (bradycardia).  You have fainting (syncope).  Shortness of breath (dyspnea) due to heart problems. LET [facility] CARE PROVIDER KNOW ABOUT:  Any allergies you may have.  All medicines you are taking, including vitamins, herbs, eye  drops, creams, and over-the-counter medicines.  Previous problems you or members of your family have had with the use of anesthetics.  Any blood disorders you have.  Previous surgeries you have had.  Medical conditions you have.  Possibility of pregnancy, if this applies. RISKS AND COMPLICATIONS Generally, pacemaker implantation is a safe procedure. However, problems can occur and include:  Bleeding.  Unable to place the pacemaker under local sedation.  Infection. BEFORE THE PROCEDURE  You will have blood work drawn before the procedure.  Do not use any tobacco products including cigarettes, chewing tobacco, or electronic cigarettes. If you need help quitting, ask your health care provider.  Do not eat or drink anything after midnight on the night before the procedure or as directed by your health care provider.  Ask your health care provider about:  Changing or stopping your regular medicines. This is especially important if you are taking diabetes medicines or blood thinners.  Taking medicines such as aspirin and ibuprofen. These medicines can thin your blood. Do not take these medicines before your procedure if your health care provider asks you not to.  Ask your health care provider if you can take a sip of water with any approved medicines the morning of the procedure. PROCEDURE  The surgery to place a pacemaker is considered a minimally invasive surgical procedure. It is done under a local anesthetic, which is an injection at the incision site that makes the skin numb. You are also given sedation and pain medicine that makes you drowsy during the  procedure.   An intravenous line (IV) will be started in your hand or arm so sedation and pain medicine can be given during the pacemaker procedure.  A numbing medicine will be injected into the skin where the pacemaker is to be placed. A small incision will then be made into the skin. The pacemaker is usually placed under the  skin near the collarbone.  After the incision has been made, the leads will be inserted into a large vein and guided into the heart using X-ray.  Using the same incision that was used to place the leads, a small pocket will be created under the skin to hold the pulse generator. The leads will then be connected to the pulse generator.  The incision site will then be closed. A bandage (dressing) is placed over the pacemaker site. The dressing is removed 24-48 hours afterward. AFTER THE PROCEDURE  You will be taken to a recovery area after the pacemaker implant. Your vital signs such as blood pressure, heart rate, breathing, and oxygen levels will be monitored.  A chest X-ray will be done after the pacemaker has been implanted. This is to make sure the pacemaker and leads are in the correct place. Document Released: 07/05/2002 Document Revised: 11/29/2013 Document Reviewed: 11/19/2011 [facility] Patient Information 2015 [place], [place]. This information is not intended to replace advice given to you by your health care provider. Make sure you discuss any questions you have with your health care provider.

## 2014-05-04 ENCOUNTER — Encounter: Payer: Self-pay | Admitting: *Deleted

## 2014-05-04 ENCOUNTER — Telehealth: Payer: Self-pay | Admitting: *Deleted

## 2014-05-04 DIAGNOSIS — Z01812 Encounter for preprocedural laboratory examination: Secondary | ICD-10-CM

## 2014-05-04 DIAGNOSIS — R001 Bradycardia, unspecified: Secondary | ICD-10-CM

## 2014-05-04 NOTE — Telephone Encounter (Signed)
Called patient to arrange PPM implant - Scheduled for 10/14. Pre procedure labs 10/9. Wound check 10/26. Letter of instructions reviewed with patient and left at front desk for her to pick up on Friday when she is in the office for lab work. Patient verbalized understanding and agreeable to plan.

## 2014-05-05 ENCOUNTER — Encounter (HOSPITAL_COMMUNITY): Payer: Self-pay | Admitting: Pharmacy Technician

## 2014-05-05 ENCOUNTER — Telehealth: Payer: Self-pay | Admitting: Internal Medicine

## 2014-05-05 NOTE — Telephone Encounter (Signed)
Follow Up  Pt requests a call back to discuss cath procedure and instructions.. Please call

## 2014-05-05 NOTE — Telephone Encounter (Signed)
Pt called again//sr

## 2014-05-05 NOTE — Telephone Encounter (Signed)
Patient just wanted to review instructions/appts again. Reviewed and she verbalized understanding. Also reminded her that letter is at front desk with instructions for her.

## 2014-05-06 ENCOUNTER — Other Ambulatory Visit (INDEPENDENT_AMBULATORY_CARE_PROVIDER_SITE_OTHER): Payer: Medicare Other | Admitting: *Deleted

## 2014-05-06 ENCOUNTER — Telehealth: Payer: Self-pay | Admitting: Internal Medicine

## 2014-05-06 DIAGNOSIS — R001 Bradycardia, unspecified: Secondary | ICD-10-CM

## 2014-05-06 DIAGNOSIS — Z01812 Encounter for preprocedural laboratory examination: Secondary | ICD-10-CM | POA: Diagnosis not present

## 2014-05-06 LAB — BASIC METABOLIC PANEL
BUN: 20 mg/dL (ref 6–23)
CALCIUM: 8.9 mg/dL (ref 8.4–10.5)
CO2: 26 meq/L (ref 19–32)
CREATININE: 1 mg/dL (ref 0.4–1.2)
Chloride: 106 mEq/L (ref 96–112)
GFR: 56.59 mL/min — ABNORMAL LOW (ref >60.00)
GLUCOSE: 96 mg/dL (ref 70–99)
Potassium: 4 mEq/L (ref 3.5–5.1)
SODIUM: 138 meq/L (ref 135–145)

## 2014-05-06 LAB — CBC WITH DIFFERENTIAL/PLATELET
BASOS ABS: 0 10*3/uL (ref 0.0–0.1)
Basophils Relative: 0.8 % (ref 0.0–3.0)
EOS ABS: 0.2 10*3/uL (ref 0.0–0.7)
Eosinophils Relative: 3.2 % (ref 0.0–5.0)
HCT: 38.1 % (ref 36.0–46.0)
Hemoglobin: 12.6 g/dL (ref 12.0–15.0)
LYMPHS PCT: 22.3 % (ref 12.0–46.0)
Lymphs Abs: 1.3 10*3/uL (ref 0.7–4.0)
MCHC: 33.1 g/dL (ref 30.0–36.0)
MCV: 91.5 fl (ref 78.0–100.0)
Monocytes Absolute: 0.6 10*3/uL (ref 0.1–1.0)
Monocytes Relative: 9.6 % (ref 3.0–12.0)
NEUTROS ABS: 3.7 10*3/uL (ref 1.4–7.7)
Neutrophils Relative %: 64.1 % (ref 43.0–77.0)
PLATELETS: 200 10*3/uL (ref 150.0–400.0)
RBC: 4.17 Mil/uL (ref 3.87–5.11)
RDW: 13.6 % (ref 11.5–15.5)
WBC: 5.8 10*3/uL (ref 4.0–10.5)

## 2014-05-06 NOTE — Telephone Encounter (Signed)
New Prob   Pt was seen today but states she did not receive paperwork regarding her upcoming surgery. Please call.

## 2014-05-06 NOTE — Telephone Encounter (Signed)
Reviewed instruction letter with patient via phone. She forgot it today when she got her labs drawn today. Verbalizes understanding of instructions.

## 2014-05-09 DIAGNOSIS — E538 Deficiency of other specified B group vitamins: Secondary | ICD-10-CM | POA: Diagnosis not present

## 2014-05-10 DIAGNOSIS — I2510 Atherosclerotic heart disease of native coronary artery without angina pectoris: Secondary | ICD-10-CM | POA: Diagnosis not present

## 2014-05-10 DIAGNOSIS — E039 Hypothyroidism, unspecified: Secondary | ICD-10-CM | POA: Diagnosis not present

## 2014-05-10 DIAGNOSIS — I471 Supraventricular tachycardia: Secondary | ICD-10-CM | POA: Diagnosis not present

## 2014-05-10 DIAGNOSIS — I495 Sick sinus syndrome: Secondary | ICD-10-CM | POA: Diagnosis not present

## 2014-05-10 DIAGNOSIS — Y831 Surgical operation with implant of artificial internal device as the cause of abnormal reaction of the patient, or of later complication, without mention of misadventure at the time of the procedure: Secondary | ICD-10-CM | POA: Diagnosis not present

## 2014-05-10 DIAGNOSIS — Z7982 Long term (current) use of aspirin: Secondary | ICD-10-CM | POA: Diagnosis not present

## 2014-05-10 DIAGNOSIS — I498 Other specified cardiac arrhythmias: Secondary | ICD-10-CM | POA: Diagnosis present

## 2014-05-10 DIAGNOSIS — I129 Hypertensive chronic kidney disease with stage 1 through stage 4 chronic kidney disease, or unspecified chronic kidney disease: Secondary | ICD-10-CM | POA: Diagnosis not present

## 2014-05-10 DIAGNOSIS — J95811 Postprocedural pneumothorax: Secondary | ICD-10-CM | POA: Diagnosis not present

## 2014-05-10 DIAGNOSIS — N183 Chronic kidney disease, stage 3 (moderate): Secondary | ICD-10-CM | POA: Diagnosis not present

## 2014-05-10 MED ORDER — SODIUM CHLORIDE 0.9 % IV SOLN
INTRAVENOUS | Status: DC
  Administered 2014-05-11: 12:00:00 via INTRAVENOUS

## 2014-05-10 MED ORDER — SODIUM CHLORIDE 0.9 % IR SOLN
80.0000 mg | Status: DC
  Filled 2014-05-10: qty 2

## 2014-05-10 MED ORDER — MUPIROCIN 2 % EX OINT
1.0000 "application " | TOPICAL_OINTMENT | Freq: Once | CUTANEOUS | Status: AC
  Administered 2014-05-11: 1 via TOPICAL
  Filled 2014-05-10: qty 22

## 2014-05-10 MED ORDER — CHLORHEXIDINE GLUCONATE 4 % EX LIQD
60.0000 mL | Freq: Once | CUTANEOUS | Status: DC
  Filled 2014-05-10: qty 60

## 2014-05-10 MED ORDER — CEFAZOLIN SODIUM-DEXTROSE 2-3 GM-% IV SOLR: 2.0000 g | INTRAVENOUS | Status: DC

## 2014-05-11 ENCOUNTER — Ambulatory Visit (HOSPITAL_COMMUNITY)
Admission: RE | Admit: 2014-05-11 | Discharge: 2014-05-14 | Disposition: A | Discharge status: Home / Self Care | Payer: Medicare Other | Source: Ambulatory Visit | Attending: Internal Medicine | Admitting: Internal Medicine

## 2014-05-11 ENCOUNTER — Encounter (HOSPITAL_COMMUNITY): Payer: Self-pay | Admitting: General Practice

## 2014-05-11 ENCOUNTER — Encounter (HOSPITAL_COMMUNITY)
Admission: RE | Disposition: A | Discharge status: Home / Self Care | Payer: Medicare Other | Source: Ambulatory Visit | Attending: Internal Medicine

## 2014-05-11 DIAGNOSIS — Z7982 Long term (current) use of aspirin: Secondary | ICD-10-CM | POA: Diagnosis not present

## 2014-05-11 DIAGNOSIS — Z950 Presence of cardiac pacemaker: Secondary | ICD-10-CM

## 2014-05-11 DIAGNOSIS — R000 Tachycardia, unspecified: Secondary | ICD-10-CM

## 2014-05-11 DIAGNOSIS — R001 Bradycardia, unspecified: Secondary | ICD-10-CM

## 2014-05-11 DIAGNOSIS — I2510 Atherosclerotic heart disease of native coronary artery without angina pectoris: Secondary | ICD-10-CM | POA: Insufficient documentation

## 2014-05-11 DIAGNOSIS — N183 Chronic kidney disease, stage 3 (moderate): Secondary | ICD-10-CM | POA: Insufficient documentation

## 2014-05-11 DIAGNOSIS — E039 Hypothyroidism, unspecified: Secondary | ICD-10-CM | POA: Insufficient documentation

## 2014-05-11 DIAGNOSIS — J95811 Postprocedural pneumothorax: Secondary | ICD-10-CM | POA: Diagnosis not present

## 2014-05-11 DIAGNOSIS — I495 Sick sinus syndrome: Secondary | ICD-10-CM | POA: Insufficient documentation

## 2014-05-11 DIAGNOSIS — I493 Ventricular premature depolarization: Secondary | ICD-10-CM

## 2014-05-11 DIAGNOSIS — I471 Supraventricular tachycardia: Secondary | ICD-10-CM | POA: Insufficient documentation

## 2014-05-11 DIAGNOSIS — Y831 Surgical operation with implant of artificial internal device as the cause of abnormal reaction of the patient, or of later complication, without mention of misadventure at the time of the procedure: Secondary | ICD-10-CM | POA: Insufficient documentation

## 2014-05-11 DIAGNOSIS — J939 Pneumothorax, unspecified: Secondary | ICD-10-CM

## 2014-05-11 DIAGNOSIS — R55 Syncope and collapse: Secondary | ICD-10-CM

## 2014-05-11 DIAGNOSIS — I472 Ventricular tachycardia: Secondary | ICD-10-CM

## 2014-05-11 DIAGNOSIS — I10 Essential (primary) hypertension: Secondary | ICD-10-CM | POA: Diagnosis present

## 2014-05-11 DIAGNOSIS — I129 Hypertensive chronic kidney disease with stage 1 through stage 4 chronic kidney disease, or unspecified chronic kidney disease: Secondary | ICD-10-CM | POA: Insufficient documentation

## 2014-05-11 HISTORY — PX: PERMANENT PACEMAKER INSERTION: SHX5480

## 2014-05-11 HISTORY — DX: Chronic kidney disease, stage 3 (moderate): N18.3

## 2014-05-11 HISTORY — DX: Chronic kidney disease, stage 3 unspecified: N18.30

## 2014-05-11 HISTORY — DX: Presence of cardiac pacemaker: Z95.0

## 2014-05-11 HISTORY — PX: PACEMAKER INSERTION: SHX728

## 2014-05-11 SURGERY — PERMANENT PACEMAKER INSERTION
Anesthesia: LOCAL

## 2014-05-11 MED ORDER — CYANOCOBALAMIN 1000 MCG/ML IJ SOLN: 1000.0000 ug | INTRAMUSCULAR | Status: DC

## 2014-05-11 MED ORDER — HEPARIN (PORCINE) IN NACL 2-0.9 UNIT/ML-% IJ SOLN
INTRAMUSCULAR | Status: AC
Start: 2014-05-11 — End: 2014-05-11
  Filled 2014-05-11: qty 500

## 2014-05-11 MED ORDER — HYDROCHLOROTHIAZIDE 25 MG PO TABS
12.5000 mg | ORAL_TABLET | Freq: Every day | ORAL | Status: DC
  Filled 2014-05-11: qty 0.5

## 2014-05-11 MED ORDER — SODIUM CHLORIDE 0.9 % IV SOLN: INTRAVENOUS | Status: AC

## 2014-05-11 MED ORDER — MUPIROCIN 2 % EX OINT
TOPICAL_OINTMENT | CUTANEOUS | Status: AC
  Administered 2014-05-11: 1 via TOPICAL
  Filled 2014-05-11: qty 22

## 2014-05-11 MED ORDER — LORATADINE 10 MG PO TABS
10.0000 mg | ORAL_TABLET | Freq: Every day | ORAL | Status: DC | PRN
  Filled 2014-05-11: qty 1

## 2014-05-11 MED ORDER — ACETAMINOPHEN 325 MG PO TABS
325.0000 mg | ORAL_TABLET | ORAL | Status: DC | PRN
Start: 2014-05-11 — End: 2014-05-14
  Administered 2014-05-11: 325 mg via ORAL
  Administered 2014-05-11 – 2014-05-14 (×9): 650 mg via ORAL
  Filled 2014-05-11 (×11): qty 2

## 2014-05-11 MED ORDER — METOPROLOL SUCCINATE ER 25 MG PO TB24
25.0000 mg | ORAL_TABLET | Freq: Every day | ORAL | Status: DC
  Administered 2014-05-12 – 2014-05-14 (×3): 25 mg via ORAL
  Filled 2014-05-11 (×4): qty 1

## 2014-05-11 MED ORDER — FENTANYL CITRATE 0.05 MG/ML IJ SOLN
INTRAMUSCULAR | Status: AC
  Filled 2014-05-11: qty 2

## 2014-05-11 MED ORDER — LOSARTAN POTASSIUM 25 MG PO TABS
25.0000 mg | ORAL_TABLET | Freq: Every day | ORAL | Status: DC
  Administered 2014-05-12: 09:00:00 25 mg via ORAL
  Filled 2014-05-11: qty 1

## 2014-05-11 MED ORDER — MIDAZOLAM HCL 5 MG/5ML IJ SOLN
INTRAMUSCULAR | Status: AC
  Filled 2014-05-11: qty 5

## 2014-05-11 MED ORDER — LIDOCAINE HCL (PF) 1 % IJ SOLN
INTRAMUSCULAR | Status: AC
  Filled 2014-05-11: qty 60

## 2014-05-11 MED ORDER — LEVOTHYROXINE SODIUM 50 MCG PO TABS
50.0000 ug | ORAL_TABLET | Freq: Every day | ORAL | Status: DC
  Administered 2014-05-12 – 2014-05-14 (×3): 50 ug via ORAL
  Filled 2014-05-11 (×4): qty 1

## 2014-05-11 MED ORDER — FLUTICASONE PROPIONATE 50 MCG/ACT NA SUSP: 2.0000 | Freq: Every day | NASAL | Status: DC | PRN

## 2014-05-11 MED ORDER — ASPIRIN EC 81 MG PO TBEC
81.0000 mg | DELAYED_RELEASE_TABLET | Freq: Every day | ORAL | Status: DC
  Administered 2014-05-11 – 2014-05-14 (×4): 81 mg via ORAL
  Filled 2014-05-11 (×4): qty 1

## 2014-05-11 MED ORDER — ONDANSETRON HCL 4 MG/2ML IJ SOLN: 4.0000 mg | Freq: Four times a day (QID) | INTRAMUSCULAR | Status: DC | PRN

## 2014-05-11 MED ORDER — CEFAZOLIN SODIUM 1-5 GM-% IV SOLN
1.0000 g | Freq: Four times a day (QID) | INTRAVENOUS | Status: AC
  Administered 2014-05-11 – 2014-05-12 (×3): 1 g via INTRAVENOUS
  Filled 2014-05-11 (×4): qty 50

## 2014-05-11 NOTE — CV Procedure (Signed)
Preop DX::symptomatic bradycardia  Post op DX:: same  Procedure dual pacemaker implantation Medtronic MRI compatible   After routine prep and drape, lidocaine was infiltrated in the prepectoral subclavicular region on the left side an incision was made and carried down to later the prepectoral fascia using electrocautery and sharp dissection a pocket was formed similarly. Hemostasis was obtained.  After this, we turned our attention to gaining accessm to the extrathoracic,left subclavian vein. This was accomplished without difficulty and without the aspiration of air . There was  puncture of the artery X 1 . 2 separate venipunctures were accomplished; guidewires were placed and retained and sequentially 7 French sheath through which were  passed an Medtronic MRI compatible  ventricular lead serial number [id number] and an Medtronic MRI compatible5076 atrial lead serial number [id number] .  The ventricular lead was manipulated to the right ventricular apex with a bipolar R wave was 14, the pacing impedance was 1012, the threshold was 0.4 @ 0.5 msec  Current at threshold was   0.4  Ma and the current of injury was  BRISK.  The right atrial lead was manipulated to the right atrial appendage with a bipolar P-wave  5, the pacing impedance was 958, the threshold 0.6@ 0.5 msec   Current at threshold was 0.6  Ma and the current of injury was BRISK.  The leads were affixed to the prepectoral fascia and attached to a  Medtronic MRI compatibleADVISA pulse generator serial number [id number].  Hemostasis was obtained. The pocket was copiously irrigated with antibiotic containing saline solution. The leads and the pulse generator were placed in the pocket and affixed to the prepectoral fascia. The wound was then closed in 2 layers in the normal fashion.  The wound was washed dried. And a dermabond adhesive was applie   Needle  Count, sponge counts and instrument counts were correct at the end of the procedure  .  ARTERIAL puncture x1 with pressure held x 6min   The patient tolerated the procedure without apparent complication.  Maryagnes Amos.D.

## 2014-05-11 NOTE — Discharge Summary (Signed)
Physician Discharge Summary  Patient ID: Michelle Winters MRN: [id number] DOB/AGE: 09/18/1931 80 y.o.  Admit date: 05/11/2014 Discharge date: 05/13/2014  Admission Diagnoses: Symptomatic Bradycardia  Discharge Diagnoses:  S/p PPM insertion  Discharged Condition: stable  Hospital Course: the patient is a 80 y/o female who has been followed for symptomatic PVCs with multiple morphologies. She underwent Myoview scanning which was abnormal and underwent catheterization which demonstrated normal coronary arteries. Cardiac event monitoring has shown nonsustained SVT. Given that she was symptomatic, she was placed on amiodarone. Unfortunately, this was complicated by significant and symptomatic bradycardia with heart rates in the 20s. PPM insertion was recommended.  The patient was admitted 05/11/14 to undergo the procedure, which was performed by Dr. Caryl Comes. She underwent successful insertion of a dual chamber Medtronic, MRI compatible PPM. She tolerated the procedure well and left the OR in stable condition. She was monitored overnight.  Post-operative CXR demonstrated proper lead placement and evidence of a small, ~ 10% left upper pneumothorax. Repeat CXR on 10/15, continued to show a small apical pneumothorax that was worse with expiration.  Pulmonology was consulted and recommended initial conservative measures including O2 and f/u CXR.  CXR on the morning of 10/16 continued to show a persistent, small, 5-10% apical pneumothorax.  She reported mild, left lower chest pleuritic discomfort.  A limited echocardiogram was performed and showed no evidence of perforation or pericardial effusion, however EF was measured @ 40%.  This was lower than what was documented on echo in 02/2014 (50-55%).    Pulmonology recommended repeat CXR on 10/17.  This has shown persistent PTX, preliminarily unchanged, final report pending.   Consults: Pulmonology  Significant Diagnostic Studies:   Post-operative CXR  05/12/14  Treatments:   PPM insertion Device:  Leads - Medtronic MRI compatible ventricular lead serial number [id number] and an Medtronic MRI compatible5076 atrial lead serial number [id number]   Generator - Medtronic MRI compatibleADVISA pulse generator serial number [id number].  2D Echocardiogram 10.16.2015  Study Conclusions  - Left ventricle: Akinesis base-inferior segment. Akinesis base/mid-inferolateral segments. Hypokinesis base-anterolateral segment. EF is 40%. The cavity size was normal. Wall thickness was normal. - Right ventricle: There is hint of a pacing wire in the RV on one image. The cavity size was normal. Systolic function was normal. - Pericardium, extracardiac: There was no pericardial effusion. _____________    Discharge Exam: Blood pressure 143/79, pulse 62, temperature 97.6 F (36.4 C), temperature source Oral, resp. rate 18, height 5' 3.25" (1.607 m), weight 135 lb 5.8 oz (61.4 kg), SpO2 99.00%.   Disposition: 01-Home or Self Care      Follow-up Information   Follow up with [facility] On 05/23/2014. (11:30 AM)    Contact information:   [place] [phone]      Follow up with Virl Axe, MD In 3 months. (we will contact you.)    Specialty:  Cardiology   Contact information:   [address] [place] [address] [phone]         Medication List         aspirin 81 MG tablet  Take 81 mg by mouth daily.     cyanocobalamin 1000 MCG/ML injection  Commonly known as:  (VITAMIN B-12)  Inject 1,000 mcg into the muscle every 30 (thirty) days.     fluticasone 50 MCG/ACT nasal spray  Commonly known as:  FLONASE  Place 2 sprays into both nostrils daily as needed for allergies.  hydrochlorothiazide 25 MG tablet  Commonly known as:  HYDRODIURIL  Take 12.5 mg by mouth daily.     levothyroxine 50 MCG tablet  Commonly known as:  SYNTHROID, LEVOTHROID  Take 50 mcg by mouth daily.      loratadine 10 MG tablet  Commonly known as:  CLARITIN  Take 10 mg by mouth daily as needed for allergies.     losartan 25 MG tablet  Commonly known as:  COZAAR  Take 25 mg by mouth daily.     metoprolol succinate 25 MG 24 hr tablet  Commonly known as:  TOPROL-XL  Take 1 tablet (25 mg total) by mouth daily.       Follow-up Information   Follow up with [facility] On 05/23/2014. (11:30 AM)    Contact information:   [place] [phone]      Follow up with Virl Axe, MD In 3 months. (we will contact you.)    Specialty:  Cardiology   Contact information:   [address] [place] [address] [phone]     TIME SPENT ON DISCHARGE, INCLUDING PHYSICIAN TIME: >30 MINUTES  Signed: Murray Hodgkins 05/13/2014, 3:04 PM  Mikle Bosworth.D.

## 2014-05-11 NOTE — H&P (View-Only) (Signed)
Patient Care Team: Lajean Manes, MD as PCP - General (Internal Medicine)   HPI  Michelle Winters is a 80 y.o. female  Seen in followup for symptomatic PVCs with multiple morphologies. She underwent Myoview scanning which was abnormal and underwent catheterization which demonstrated normal coronary arteries. She been seen by Richardson Dopp for palpitations and was given an event recorder. This demonstrated nonsustained SVT. She was seen by Dr. Greggory Brandy for symptomatic nonsustained SVT and he recommended amiodarone.  Unfortunately, this was complicated by significant and symptomatic bradycardia with heart rates in the 20s.   She continues to feel sluggish and comes in today with heart rates in the 40s   we reviewed her electrocardiogram an event recorder and she has rates in the 180s as well as rates into the 30s and 40s.     Echocardiogram demonstrated EF of 50-55% motion abnormalities.      Past Medical History  Diagnosis Date  . Heart palpitations     a. paroxysmal SVT, PVCs, NSVT. b. echo 03/27/14: EF 94-85%, nl systolic function, AK of basalinferior myocardium, possible HK of the apicallateral and inferolateral myocardium, Ao trivial regurg, mild MR, mild LAE, trivial pulmonic regurg  . Hypertension   . Bradycardia   . Osteopenia 06/2012 T score -1.9 FRAX    13%/3.6%  . CAD (coronary artery disease)     a. cath 07/01/13: LM no obs dz, LAD widely patent no obs dz, LCx normal in appearance, RCA  small, nondominant vessel. Catheter-induced spasm at the ostium, EF 50-55%  . Wide-complex tachycardia 03/25/2014  . Hypothyroidism   . Arthritis     "hands, fingers, toes" (03/26/2014)  . Cystitis 1950"s    "tube to bladder was in a kink & I had polyps; surgically corrected"    Past Surgical History  Procedure Laterality Date  . Cholecystectomy    . Dilation and curettage of uterus    . Hysteroscopy  1950's    "tube to bladder was in a kink & I had polyps; surgically corrected"    . Tonsillectomy    . Cardiac catheterization  06/2013    normal coronary arteries    Current Outpatient Prescriptions  Medication Sig Dispense Refill  . aspirin 81 MG tablet Take 81 mg by mouth daily.      . Cyanocobalamin (VITAMIN B-12 IJ) Inject 1 inch as directed. Monthly      . fluticasone (FLONASE) 50 MCG/ACT nasal spray Place 1 spray into both nostrils as needed for allergies.       . hydrochlorothiazide (HYDRODIURIL) 25 MG tablet Take 12.5 mg by mouth daily.       [name] levothyroxine (SYNTHROID, LEVOTHROID) 50 MCG tablet Take 50 mcg by mouth daily.       [name] loratadine (CLARITIN) 10 MG tablet Take 10 mg by mouth daily as needed for allergies.       [name] losartan (COZAAR) 25 MG tablet Take 25 mg by mouth daily.       No current facility-administered medications for this visit.    Allergies  Allergen Reactions  . Lisinopril Other (See Comments)    Chest tightness  . Simvastatin Other (See Comments)    Muscle pain  . Welchol [Colesevelam Hcl] Other (See Comments)    Muscle pain   . Amiodarone Other (See Comments)    Lowered heart rate   . Zetia [Ezetimibe] Nausea Only    Review of Systems negative except from HPI and PMH  Physical Exam BP  130/60  Pulse 61  Ht 5\' 3"  (1.6 m)  Wt 131 lb 3.2 oz (59.512 kg)  BMI 23.25 kg/m2 Well developed and well nourished in no acute distress HENT normal E scleral and icterus clear Neck Supple JVP flat; carotids brisk and full Clear to ausculation Irregular rate and rhythm, consistent with PVCs no murmurs gallops or rub Soft with active bowel sounds No clubbing cyanosis none Edema Alert and oriented, grossly normal motor and sensory function Skin Warm and Dry    Assessment and  Plan  Wide-complex tachycardia presumed SVT  Sinus node dysfunction with bradycardia  PVCs with functional bradycardia  The patient has tachybradycardia syndrome with symptomatic tachycardia as well symptomatic bradycardia. She will need backup  bradycardia pacing which will allow for rhythm control and rate control with strategies.  The benefits and risks were reviewed including but not limited to death,  perforation, infection, lead dislodgement and device malfunction.  The patient understands agrees and is willing to proceed.

## 2014-05-11 NOTE — Progress Notes (Signed)
Patient stated she has had problems with using metoprolol in the past and had to stop the medication because of muscle aches in arms and legs bilaterally and was unable to tolerate metoprolol. Dr Caryl Comes not on call and reviewed information with PA on call. Instructed to write a note and have rounding team in am evaluate medications at that time. Reported to Maxwell Marion RN and reviewed with patient.

## 2014-05-11 NOTE — Interval H&P Note (Signed)
History and Physical Interval Note:  05/11/2014 1:01 PM  Michelle Winters  has presented today for surgery, with the diagnosis of bradycardia  The various methods of treatment have been discussed with the patient and family. After consideration of risks, benefits and other options for treatment, the patient has consented to  Procedure(s): PERMANENT PACEMAKER INSERTION ([name]) as a surgical intervention .  The patient's history has been reviewed, patient examined, no change in status, stable for surgery.  I have reviewed the patient's chart and labs.  Questions were answered to the patient's satisfaction.     Virl Axe

## 2014-05-11 NOTE — Care Management Note (Addendum)
  Page 1 of 1   05/11/2014     6:58:51 PM CARE MANAGEMENT NOTE 05/11/2014  Patient:  Michelle Winters, Michelle Winters   Account Number:  [id number]  Date Initiated:  05/11/2014  Documentation initiated by:  Eberardo Demello  Subjective/Objective Assessment:   symptomatic bradycardis     Action/Plan:   CM to follow for disposition needs   Anticipated DC Date:  05/12/2014   Anticipated DC Plan:  HOME/SELF CARE         Choice offered to / List presented to:             Status of service:  Completed, signed off Medicare Important Message given?   (If response is "NO", the following Medicare IM given date fields will be blank) Date Medicare IM given:   Medicare IM given by:   Date Additional Medicare IM given:   Additional Medicare IM given by:    Discharge Disposition:  HOME/SELF CARE  Per UR Regulation:    If discussed at Long Length of Stay Meetings, dates discussed:    Comments:  Thara Searing RN, BSN, MSHL, CCM  Nurse - Case Manager,  (Unit [phone]  05/11/2014 Med Review: No needs identified CM will continue to monitor.

## 2014-05-12 ENCOUNTER — Ambulatory Visit (HOSPITAL_COMMUNITY): Payer: Medicare Other

## 2014-05-12 ENCOUNTER — Encounter (HOSPITAL_COMMUNITY): Payer: Self-pay | Admitting: Nurse Practitioner

## 2014-05-12 DIAGNOSIS — I471 Supraventricular tachycardia: Secondary | ICD-10-CM | POA: Diagnosis not present

## 2014-05-12 DIAGNOSIS — Z7982 Long term (current) use of aspirin: Secondary | ICD-10-CM | POA: Diagnosis not present

## 2014-05-12 DIAGNOSIS — I495 Sick sinus syndrome: Secondary | ICD-10-CM | POA: Diagnosis not present

## 2014-05-12 DIAGNOSIS — Z45018 Encounter for adjustment and management of other part of cardiac pacemaker: Secondary | ICD-10-CM | POA: Diagnosis not present

## 2014-05-12 DIAGNOSIS — I2510 Atherosclerotic heart disease of native coronary artery without angina pectoris: Secondary | ICD-10-CM | POA: Diagnosis not present

## 2014-05-12 DIAGNOSIS — J939 Pneumothorax, unspecified: Secondary | ICD-10-CM | POA: Diagnosis not present

## 2014-05-12 DIAGNOSIS — E039 Hypothyroidism, unspecified: Secondary | ICD-10-CM | POA: Diagnosis not present

## 2014-05-12 DIAGNOSIS — J95811 Postprocedural pneumothorax: Secondary | ICD-10-CM | POA: Diagnosis not present

## 2014-05-12 MED ORDER — LOSARTAN POTASSIUM 25 MG PO TABS
25.0000 mg | ORAL_TABLET | Freq: Once | ORAL | Status: AC
  Administered 2014-05-12: 25 mg via ORAL
  Filled 2014-05-12: qty 1

## 2014-05-12 MED ORDER — METOPROLOL SUCCINATE ER 25 MG PO TB24: 25.0000 mg | ORAL_TABLET | Freq: Every day | ORAL | Status: DC

## 2014-05-12 MED ORDER — LOSARTAN POTASSIUM 50 MG PO TABS
50.0000 mg | ORAL_TABLET | Freq: Every day | ORAL | Status: DC
  Administered 2014-05-13 – 2014-05-14 (×2): 50 mg via ORAL
  Filled 2014-05-12 (×2): qty 1

## 2014-05-12 MED ORDER — HYDROCHLOROTHIAZIDE 12.5 MG PO CAPS
12.5000 mg | ORAL_CAPSULE | Freq: Every day | ORAL | Status: DC
  Administered 2014-05-12 – 2014-05-14 (×3): 12.5 mg via ORAL
  Filled 2014-05-12 (×3): qty 1

## 2014-05-12 MED ORDER — YOU HAVE A PACEMAKER BOOK
Freq: Once | Status: AC
  Administered 2014-05-12: 13:00:00
  Filled 2014-05-12: qty 1

## 2014-05-12 NOTE — Progress Notes (Signed)
Notified Dr.Allred and Amber of elevated BP orders received to change meds and continue to monitor. Patient stable no complications of shortness of breath. Waiting to go for repeat CXR.

## 2014-05-12 NOTE — Progress Notes (Signed)
   ELECTROPHYSIOLOGY ROUNDING NOTE    Patient Name: Michelle Winters Date of Encounter: 05/12/2014    SUBJECTIVE:Patient feels well this morning.  No chest pain or shortness of breath. Status post PPM implant 05-11-14.   TELEMETRY: Reviewed telemetry pt atrial pacing with intrinsic ventricular conduction, PVC's Filed Vitals:   05/11/14 1949 05/11/14 2320 05/12/14 0036 05/12/14 0606  BP: 138/56 139/52  164/79  Pulse: 63 61  60  Temp: 97.4 F (36.3 C) 97.6 F (36.4 C)  97.7 F (36.5 C)  TempSrc: Oral Oral  Oral  Resp: 18 18  20   Height:      Weight:   132 lb 4.4 oz (60 kg)   SpO2: 97% 94%  100%    Intake/Output Summary (Last 24 hours) at 05/12/14 0649 Last data filed at 05/12/14 5329  Gross per 24 hour  Intake  747.5 ml  Output   2400 ml  Net -1652.5 ml    CURRENT MEDICATIONS: . aspirin EC  81 mg Oral Daily  .  ceFAZolin (ANCEF) IV  1 g Intravenous Q6H  . hydrochlorothiazide  12.5 mg Oral Daily  . levothyroxine  50 mcg Oral QAC breakfast  . losartan  25 mg Oral Daily  . metoprolol succinate  25 mg Oral Daily     Radiology/Studies:  Final result pending, leads in stable position.  PHYSICAL EXAM Well developed and nourished in no acute distress HENT normal Neck supple with JVP-flat Clear Regular rate and rhythm, no murmurs or gallops Abd-soft with active BS No Clubbing cyanosis edema Skin-warm and dry A & Oriented  Grossly normal sensory and motor function  CXR  Stable leads  Some tension on RV lead   DEVICE INTERROGATION: Device interrogation pending.    Active Problems:   Symptomatic bradycardia  hypertension  Will increase metoprolol Discharge post device interrogation

## 2014-05-12 NOTE — Progress Notes (Signed)
Received call from [place] about radiology report patient is positive for small pneumothorax about 10% in left apex Dr. Caryl Comes was notified and orders received to follow up  with CXR in 4 hours. Patient updated also. I will continue to monitor.

## 2014-05-12 NOTE — Progress Notes (Signed)
CXR>> pneumothorax  Repeat xray at midday showed lack of resolution  PPulm was to be called but dont see note Will followup

## 2014-05-12 NOTE — Discharge Instructions (Signed)
**  PLEASE REMEMBER TO BRING ALL OF YOUR MEDICATIONS TO EACH OF YOUR FOLLOW-UP OFFICE VISITS.     Supplemental Discharge Instructions for  Pacemaker/Defibrillator Patients  Activity No heavy lifting or vigorous activity with your left/right arm for 6 to 8 weeks.  Do not raise your left/right arm above your head for one week.  Gradually raise your affected arm as drawn below.           __         10/19               /           10/20           /           10/21          /          10/22            NO DRIVING for     ; you may begin driving on  25/95   .  WOUND CARE   Keep the wound area clean and dry.  Do not get this area wet for one week. No showers for one week; you may shower on  10/22   .   The tape/steri-strips on your wound will fall off; do not pull them off.  No bandage is needed on the site.  DO  NOT apply any creams, oils, or ointments to the wound area.   If you notice any drainage or discharge from the wound, any swelling or bruising at the site, or you develop a fever > 101? F after you are discharged home, call the office at once.  Special Instructions   You are still able to use cellular telephones; use the ear opposite the side where you have your pacemaker/defibrillator.  Avoid carrying your cellular phone near your device.   When traveling through airports, show security personnel your identification card to avoid being screened in the metal detectors.  Ask the security personnel to use the hand wand.   Avoid arc welding equipment, MRI testing (magnetic resonance imaging), TENS units (transcutaneous nerve stimulators).  Call the office for questions about other devices.   Avoid electrical appliances that are in poor condition or are not properly grounded.   Microwave ovens are safe to be near or to operate.  Additional information for defibrillator patients should your device go off:   If your device goes off ONCE and you feel fine afterward, notify the device clinic  nurses.   If your device goes off ONCE and you do not feel well afterward, call 911.   If your device goes off TWICE, call 911.   If your device goes off THREE times in one day, call 911.  DO NOT DRIVE YOURSELF OR A FAMILY MEMBER WITH A DEFIBRILLATOR TO THE HOSPITAL--CALL 911.

## 2014-05-12 NOTE — Progress Notes (Signed)
Patient continues to be stable repeat CXR done and Michelle Winters. Went over results at bedside. Oxygen placed on her per orders O2 sats stable and will continue to monitor.

## 2014-05-12 NOTE — Consult Note (Signed)
Name: RADHIKA DERSHEM MRN: [id number] DOB: 06/01/1932    ADMISSION DATE:  05/11/2014 CONSULTATION DATE:  05/12/2014  REFERRING MD :  Caryl Comes  CHIEF COMPLAINT:  Pneumothorax  BRIEF PATIENT DESCRIPTION:  80 y.o. F who underwent pacemaker insertion 10/14.  CXR on AM of 10/15 showed small left apical PTX.  Follow up later that day showed interval slight increase in size of PTX. PCCM consulted for recs.  SIGNIFICANT EVENTS  10/14 - admitted, underwent PPM implant 10/15 - developed left PTX, increase in size on f/u CXR.  STUDIES:    HISTORY OF PRESENT ILLNESS:  Mrs. Lagrange is an 80 y.o. F who was admitted 10/14 for implantation of PPM due to symptomatic sick sinus syndrome.  Procedure performed successfully with no immediate complications. The following morning, CXR showed 10% left apical PTX.  Repeat CXR 8 hours later showed interval increase in size of PTX.  PCCM was consulted for further recs.  Pt denies any chest pain, SOB, distress.  She was on room air most of the day and only this afternoon was placed on 1L SpO2.  BP's have remained stable.   PAST MEDICAL HISTORY :   has a past medical history of Heart palpitations; Hypertension; Bradycardia; Osteopenia (06/2012 T score -1.9 FRAX); CAD (coronary artery disease); Wide-complex tachycardia (03/25/2014); Hypothyroidism; Cystitis (1950"s); Pacemaker; Heart murmur; Arthritis; and Chronic kidney disease (CKD), stage III (moderate).  has past surgical history that includes Cholecystectomy; Dilation and curettage of uterus; Hysteroscopy (1950's); Tonsillectomy; Cardiac catheterization (06/2013); Insert / replace / remove pacemaker (05/11/2014); and Cataract extraction w/ intraocular lens  implant, bilateral (Bilateral). Prior to Admission medications   Medication Sig Start Date End Date Taking? Authorizing Provider  aspirin 81 MG tablet Take 81 mg by mouth daily.   Yes Historical Provider, MD  cyanocobalamin (,VITAMIN B-12,) 1000 MCG/ML  injection Inject 1,000 mcg into the muscle every 30 (thirty) days.   Yes Historical Provider, MD  hydrochlorothiazide (HYDRODIURIL) 25 MG tablet Take 12.5 mg by mouth daily.    Yes Historical Provider, MD  levothyroxine (SYNTHROID, LEVOTHROID) 50 MCG tablet Take 50 mcg by mouth daily.    Yes Historical Provider, MD  loratadine (CLARITIN) 10 MG tablet Take 10 mg by mouth daily as needed for allergies.    Yes Historical Provider, MD  losartan (COZAAR) 25 MG tablet Take 25 mg by mouth daily.   Yes Historical Provider, MD  fluticasone (FLONASE) 50 MCG/ACT nasal spray Place 2 sprays into both nostrils daily as needed for allergies.     Historical Provider, MD  metoprolol succinate (TOPROL-XL) 25 MG 24 hr tablet Take 1 tablet (25 mg total) by mouth daily. 05/12/14   Rogelia Mire, NP   Allergies  Allergen Reactions  . Lisinopril Other (See Comments)    Chest tightness  . Simvastatin Other (See Comments)    Muscle pain  . Welchol [Colesevelam Hcl] Other (See Comments)    Muscle pain   . Metoprolol     Muscle aches in arms and legs bilaterally  . Amiodarone Other (See Comments)    Lowered heart rate   . Zetia [Ezetimibe] Nausea Only    FAMILY HISTORY:  family history includes Cancer in her sister; Diabetes in her sister; Heart attack in her father; Leukemia in her other; Multiple sclerosis in her mother; Nephritis in her other. SOCIAL HISTORY:  reports that she has never smoked. She has never used smokeless tobacco. She reports that she does not drink alcohol or use illicit drugs.  REVIEW OF SYSTEMS:   All negative; except for those that are bolded, which indicate positives.  Constitutional: weight loss, weight gain, night sweats, fevers, chills, fatigue, weakness.  HEENT: headaches, sore throat, sneezing, nasal congestion, post nasal drip, difficulty swallowing, tooth/dental problems, visual complaints, visual changes, ear aches. Neuro: difficulty with speech, weakness, numbness,  ataxia. CV:  chest pain, orthopnea, PND, swelling in lower extremities, dizziness, palpitations, syncope.  Resp: cough, hemoptysis, dyspnea, wheezing. GI  heartburn, indigestion, abdominal pain, nausea, vomiting, diarrhea, constipation, change in bowel habits, loss of appetite, hematemesis, melena, hematochezia.  GU: dysuria, change in color of urine, urgency or frequency, flank pain, hematuria. MSK: joint pain or swelling, decreased range of motion. Psych: change in mood or affect, depression, anxiety, suicidal ideations, homicidal ideations. Skin: rash, itching, bruising.   SUBJECTIVE:  Pt feels comfortable.  Denies any symptoms.  VITAL SIGNS: Temp:  [97.4 F (36.3 C)-97.7 F (36.5 C)] 97.7 F (36.5 C) (10/15 1712) Pulse Rate:  [59-63] 62 (10/15 1712) Resp:  [18-20] 18 (10/15 1712) BP: (136-200)/(52-92) 151/92 mmHg (10/15 1712) SpO2:  [94 %-100 %] 99 % (10/15 1712) Weight:  [60 kg (132 lb 4.4 oz)] 60 kg (132 lb 4.4 oz) (10/15 0036)  PHYSICAL EXAMINATION: General: Pleasant elderly female, sitting up in chair eating dinner, in NAD. Neuro: A&O x 3, non-focal.  HEENT: [place]/AT. PERRL, sclerae anicteric. Cardiovascular: RRR, no M/R/G.  Lungs: Respirations even and unlabored.  Slightly decreased LUL, otherwise CTA bilaterally, No W/R/R. Abdomen: BS x 4, soft, NT/ND.  Musculoskeletal: No gross deformities, no edema.  Skin: Intact, warm, no rashes.     Recent Labs Lab 05/06/14 1117  NA 138  K 4.0  CL 106  CO2 26  BUN 20  CREATININE 1.0  GLUCOSE 96    Recent Labs Lab 05/06/14 1117  HGB 12.6  HCT 38.1  WBC 5.8  PLT 200.0   Dg Chest 1 View  05/12/2014   CLINICAL DATA:  Pneumothorax on left, subsequent encounter.  EXAM: CHEST - 1 VIEW  COMPARISON:  05/12/2014 at 0550 hr.  FINDINGS: Trachea is midline, given patient rotation. Heart is at the upper limits of normal in size. Left subclavian pacemaker lead tips project over the right atrium and right ventricle. Small left  apical pneumothorax increases on expiration. Linear scarring or atelectasis in the left lower lobe. No definite pleural fluid.  IMPRESSION: Small left apical pneumothorax increases on expiration.   Electronically Signed   By: Lorin Picket M.D.   On: 05/12/2014 16:25   Dg Chest 2 View  05/12/2014   CLINICAL DATA:  Post pacemaker placement  EXAM: CHEST  2 VIEW  COMPARISON:  03/19/2014  FINDINGS: Cardiomediastinal silhouette is stable. There is dual lead cardiac pacemaker with left subclavian approach with leads in right atrium and right ventricle. No acute infiltrate or pulmonary edema. There is small left upper pneumothorax measures about 10%.  IMPRESSION: Dual lead cardiac pacemaker in place. There is about 10% left upper pneumothorax. I discussed with patient's nurse [name]   Electronically Signed   By: [name] M.D.   On: 05/12/2014 08:12    ASSESSMENT / PLAN:  Small left apical PTX - following PPM implantation 1 day prior.  Pt is hemodynamically stable and has remained asymptomatic since procedure. Recs: Supplemental O2 as needed to maintain SpO2 > 92%. No immediate need for chest tube. Follow up CXR now. If CXR much worse and / or pt begins to become hemodynamically unstable, then would likely proceed with chest  tube placement. CXR in AM. Will follow.   Montey Hora, [facility] Pgr: [phone]  or [phone] 05/12/2014, 7:28 PM  Reviewed above, examined.  80 yo female presented for pacemaker insertion on 10/14 in setting of symptomatic bradycardia.  This was complicated by 22% Lt pneumothorax.  F/u CXR showed slight progress and PCCM consulted to assess.  There is no hx of tobacco use, and she denies history of prior lung disease.  She is symptom free, and exam is benign.  Her hemodynamics and oxygenation are stable.  Will continue clinical monitoring with f/u CXR.  Defer chest tube placement for now.  Will continue supplemental  oxygen to help flush out pneumothorax.  Chesley Mires, MD [facility] Pulmonary/Critical Care 05/12/2014, 9:35 PM Pager:  [phone] After 3pm call: [phone]

## 2014-05-13 ENCOUNTER — Ambulatory Visit (HOSPITAL_COMMUNITY): Payer: Medicare Other

## 2014-05-13 DIAGNOSIS — J95811 Postprocedural pneumothorax: Secondary | ICD-10-CM | POA: Diagnosis not present

## 2014-05-13 DIAGNOSIS — E039 Hypothyroidism, unspecified: Secondary | ICD-10-CM | POA: Diagnosis not present

## 2014-05-13 DIAGNOSIS — I495 Sick sinus syndrome: Secondary | ICD-10-CM | POA: Diagnosis not present

## 2014-05-13 DIAGNOSIS — Z7982 Long term (current) use of aspirin: Secondary | ICD-10-CM | POA: Diagnosis not present

## 2014-05-13 DIAGNOSIS — J939 Pneumothorax, unspecified: Secondary | ICD-10-CM | POA: Diagnosis not present

## 2014-05-13 DIAGNOSIS — T82190A Other mechanical complication of cardiac electrode, initial encounter: Secondary | ICD-10-CM

## 2014-05-13 DIAGNOSIS — I2510 Atherosclerotic heart disease of native coronary artery without angina pectoris: Secondary | ICD-10-CM | POA: Diagnosis not present

## 2014-05-13 DIAGNOSIS — I471 Supraventricular tachycardia: Secondary | ICD-10-CM | POA: Diagnosis not present

## 2014-05-13 DIAGNOSIS — I10 Essential (primary) hypertension: Secondary | ICD-10-CM | POA: Diagnosis not present

## 2014-05-13 DIAGNOSIS — Z45018 Encounter for adjustment and management of other part of cardiac pacemaker: Secondary | ICD-10-CM | POA: Diagnosis not present

## 2014-05-13 DIAGNOSIS — Z950 Presence of cardiac pacemaker: Secondary | ICD-10-CM | POA: Diagnosis not present

## 2014-05-13 LAB — MRSA CULTURE

## 2014-05-13 NOTE — Progress Notes (Addendum)
Patient Name: Michelle Winters Date of Encounter: 05/13/2014   Principal Problem:   Symptomatic bradycardia Active Problems:   S/P cardiac pacemaker procedure 05/11/14 [employer]   Pneumothorax on right   Hypertension   SUBJECTIVE  No dyspnea overnight.  She does have some incisional discomfort along with mild left lower chest pleuritic pain - she thinks that's secondary to positioning and restricting her L shoulder mvmt.  CXR repeated this AM.  Radiology read pending - PTX looks stable.  CURRENT MEDS . aspirin EC  81 mg Oral Daily  . hydrochlorothiazide  12.5 mg Oral Daily  . levothyroxine  50 mcg Oral QAC breakfast  . losartan  50 mg Oral Daily  . metoprolol succinate  25 mg Oral Daily    OBJECTIVE  Filed Vitals:   05/13/14 0005 05/13/14 0414 05/13/14 0415 05/13/14 0734  BP: 139/66  142/51 156/64  Pulse:   58 62  Temp: 97.9 F (36.6 C) 97.7 F (36.5 C)  97.4 F (36.3 C)  TempSrc: Oral Oral  Oral  Resp: 18   18  Height:      Weight: 135 lb 5.8 oz (61.4 kg)     SpO2:   95% 99%    Intake/Output Summary (Last 24 hours) at 05/13/14 0752 Last data filed at 05/13/14 0735  Gross per 24 hour  Intake    660 ml  Output   4000 ml  Net  -3340 ml   Filed Weights   05/11/14 1110 05/12/14 0036 05/13/14 0005  Weight: 131 lb 2 oz (59.478 kg) 132 lb 4.4 oz (60 kg) 135 lb 5.8 oz (61.4 kg)   PHYSICAL EXAM  General: Pleasant, NAD. Neuro: Alert and oriented X 3. Moves all extremities spontaneously. Psych: Normal affect. HEENT:  Normal  Neck: Supple without bruits or JVD. Lungs:  Resp regular and unlabored, CTA. Heart: RRR no s3, s4, or murmurs. Abdomen: Soft, non-tender, non-distended, BS + x 4.  Extremities: No clubbing, cyanosis or edema. DP/PT/Radials 2+ and equal bilaterally.  TELE  Predominantly A-paced.  PVC's.  Radiology/Studies  Dg Chest 1 View  05/12/2014   CLINICAL DATA:  Pneumothorax on left, subsequent encounter.  EXAM: CHEST - 1 VIEW  COMPARISON:   05/12/2014 at 0550 hr.  FINDINGS: Trachea is midline, given patient rotation. Heart is at the upper limits of normal in size. Left subclavian pacemaker lead tips project over the right atrium and right ventricle. Small left apical pneumothorax increases on expiration. Linear scarring or atelectasis in the left lower lobe. No definite pleural fluid.  IMPRESSION: Small left apical pneumothorax increases on expiration.   Electronically Signed   By: Lorin Picket M.D.   On: 05/12/2014 16:25   Dg Chest 2 View  05/12/2014   CLINICAL DATA:  Post pacemaker placement  EXAM: CHEST  2 VIEW  COMPARISON:  03/19/2014  FINDINGS: Cardiomediastinal silhouette is stable. There is dual lead cardiac pacemaker with left subclavian approach with leads in right atrium and right ventricle. No acute infiltrate or pulmonary edema. There is small left upper pneumothorax measures about 10%.  IMPRESSION: Dual lead cardiac pacemaker in place. There is about 10% left upper pneumothorax. I discussed with patient's nurse [name]   Electronically Signed   By: [name] M.D.   On: 05/12/2014 08:12   ASSESSMENT AND PLAN  1.  Symptomatic bradycardia:  S/p MDT DC PPM on 10/14.  Post-op CXR showed small apical PTX.  Stable by CXR last night.  Repeat CXR performed this morning.  Radiology read pending.  Appears to be stable.  Ambulate.  2.  Right Apical PTX:  S/p PPM placement.  As above, await radiology read on CXR this AM.  Pulmonology saw last night - no need for chest tube.  3.  HTN:  BP up this morning.  Trended a little better last night.  F/U after meds this morning.  Toprol added yesterday.  Signed, Murray Hodgkins NP Inteval improvement ( to my eye)  Await input from CCm with anticipated discharge later today  Blood pressure some better;   Will discharge on current dose of betablocker and L/HCT w BP followup in few weeks with PCP Wound check and  sk 3 month  Also with some pleuritic pain in mid chest-- now  resolved Will check echo To call us next TUES to report status and ER ASAP if develops shortness of breath

## 2014-05-13 NOTE — Consult Note (Signed)
Name: Michelle Winters MRN: [id number] DOB: [birth year]    ADMISSION DATE:  05/11/2014 CONSULTATION DATE:  05/12/2014  REFERRING MD :  Michelle Winters  CHIEF COMPLAINT:  Pneumothorax  BRIEF PATIENT DESCRIPTION:  80 y.o. F who underwent pacemaker insertion 10/14.  CXR on AM of 10/15 showed small left apical PTX.  Follow up later that day showed interval slight increase in size of PTX. PCCM consulted for recs.  SIGNIFICANT EVENTS  10/14 - admitted, underwent PPM implant 10/15 - developed left PTX, increase in size on f/u CXR. 10/16 left pnx decreased on cxr.  STUDIES:    HISTORY OF PRESENT ILLNESS:  Michelle Winters is an 80 y.o. F who was admitted 10/14 for implantation of PPM due to symptomatic sick sinus syndrome.  Procedure performed successfully with no immediate complications. The following morning, CXR showed 10% left apical PTX.  Repeat CXR 8 hours later showed interval increase in size of PTX.  PCCM was consulted for further recs.  Pt denies any chest pain, SOB, distress.  She was on room air most of the day and only this afternoon was placed on 1L SpO2.  BP's have remained stable.   SUBJECTIVE:  NAD, no SOB  VITAL SIGNS: Temp:  [97.4 F (36.3 C)-98 F (36.7 C)] 97.4 F (36.3 C) (10/16 0734) Pulse Rate:  [58-62] 62 (10/16 0734) Resp:  [18] 18 (10/16 0734) BP: (131-200)/(51-92) 156/64 mmHg (10/16 0734) SpO2:  [95 %-100 %] 99 % (10/16 0734) Weight:  [135 lb 5.8 oz (61.4 kg)] 135 lb 5.8 oz (61.4 kg) (10/16 0005)  PHYSICAL EXAMINATION: General: Pleasant elderly female, lying in bed, in NAD. Neuro: A&O x 3, non-focal.  HEENT: [place]/AT. PERRL, sclerae anicteric. Cardiovascular: RRR, no M/R/G.  Lungs: Respirations even and unlabored.  =BBS[facility]  Abdomen: BS x 4, soft, NT/ND.  Musculoskeletal: No gross deformities, no edema.  Skin: Intact, warm, no rashes. Left sub clavicular pacer pocket suture line intact      Recent Labs Lab 05/06/14 1117  NA 138  K 4.0  CL 106  CO2 26    BUN 20  CREATININE 1.0  GLUCOSE 96    Recent Labs Lab 05/06/14 1117  HGB 12.6  HCT 38.1  WBC 5.8  PLT 200.0   Dg Chest 1 View  05/12/2014   CLINICAL DATA:  Pneumothorax on left, subsequent encounter.  EXAM: CHEST - 1 VIEW  COMPARISON:  05/12/2014 at 0550 hr.  FINDINGS: Trachea is midline, given patient rotation. Heart is at the upper limits of normal in size. Left subclavian pacemaker lead tips project over the right atrium and right ventricle. Small left apical pneumothorax increases on expiration. Linear scarring or atelectasis in the left lower lobe. No definite pleural fluid.  IMPRESSION: Small left apical pneumothorax increases on expiration.   Electronically Signed   By: Lorin Picket M.D.   On: 05/12/2014 16:25   Dg Chest 2 View  05/12/2014   CLINICAL DATA:  Post pacemaker placement  EXAM: CHEST  2 VIEW  COMPARISON:  03/19/2014  FINDINGS: Cardiomediastinal silhouette is stable. There is dual lead cardiac pacemaker with left subclavian approach with leads in right atrium and right ventricle. No acute infiltrate or pulmonary edema. There is small left upper pneumothorax measures about 10%.  IMPRESSION: Dual lead cardiac pacemaker in place. There is about 10% left upper pneumothorax. I discussed with patient's nurse [name]   Electronically Signed   By: [name] M.D.   On: 05/12/2014 08:12   Dg Chest Port 1  View  05/13/2014   CLINICAL DATA:  Left pneumothorax after pacemaker placement.  EXAM: PORTABLE CHEST - 1 VIEW  COMPARISON:  05/12/2014  FINDINGS: Left apical pneumothorax shows reduction in size since the prior exam. Estimated residual pneumothorax is approximately 5-10% in volume. No pulmonary consolidation, mediastinal shift or edema. No pleural fluid identified. Pacemaker shows stable positioning.  IMPRESSION: Decrease in left pneumothorax with residual roughly 5-10% apical pneumothorax remaining.   Electronically Signed   By: Aletta Edouard M.D.   On: 05/13/2014 08:16     ASSESSMENT / PLAN:  Small left apical PTX - following PPM implantation 1 day prior.  Pt is hemodynamically stable and has remained asymptomatic since procedure.  PTX smaller on CXR 10/16 Recs: Supplemental O2 as needed to maintain SpO2 > 92%. No indication chest tube. PTX has decreased in size 10/16. Could consider d/c to home vs another 24 hours observation. If she decompensates or if PTX increases in size on f/u CXR then would need pigtail chest tube.  Repeat CXR 10/17 if she is not d/c to home. We will follow the film w you if she is in house.   Richardson Landry Minor ACNP Maryanna Shape PCCM Pager [phone] till 3 pm If no answer page [phone] 05/13/2014, 9:26 AM  Baltazar Apo, MD, PhD 05/13/2014, 12:32 PM [facility] [phone] or if no answer [phone]

## 2014-05-13 NOTE — Progress Notes (Signed)
Echocardiogram 2D Echocardiogram has been performed.  Michelle Winters 05/13/2014, 9:51 AM

## 2014-05-13 NOTE — Progress Notes (Signed)
Patient ambulated over 300 feet with swift steady gait. Oxygen saturations during ambulation remain at 97-100% on room air. Patient reported feeling no shortness of breath with ambulation. Resting quietly at this time, states she just feels sleepy.

## 2014-05-14 ENCOUNTER — Ambulatory Visit (HOSPITAL_COMMUNITY): Payer: Medicare Other

## 2014-05-14 ENCOUNTER — Encounter (HOSPITAL_COMMUNITY): Payer: Self-pay | Admitting: *Deleted

## 2014-05-14 DIAGNOSIS — R55 Syncope and collapse: Secondary | ICD-10-CM

## 2014-05-14 DIAGNOSIS — I471 Supraventricular tachycardia: Secondary | ICD-10-CM | POA: Diagnosis not present

## 2014-05-14 DIAGNOSIS — J939 Pneumothorax, unspecified: Secondary | ICD-10-CM | POA: Diagnosis not present

## 2014-05-14 DIAGNOSIS — I495 Sick sinus syndrome: Secondary | ICD-10-CM | POA: Diagnosis not present

## 2014-05-14 DIAGNOSIS — Z7982 Long term (current) use of aspirin: Secondary | ICD-10-CM | POA: Diagnosis not present

## 2014-05-14 DIAGNOSIS — J95811 Postprocedural pneumothorax: Secondary | ICD-10-CM | POA: Diagnosis not present

## 2014-05-14 DIAGNOSIS — E039 Hypothyroidism, unspecified: Secondary | ICD-10-CM | POA: Diagnosis not present

## 2014-05-14 DIAGNOSIS — I2510 Atherosclerotic heart disease of native coronary artery without angina pectoris: Secondary | ICD-10-CM | POA: Diagnosis not present

## 2014-05-14 NOTE — Progress Notes (Signed)
   Reviewed with Dr. [name]. He has reviewed the CXR from this AM and notes that the pneumothorax looks unchanged. Official report is pending. Dr. Lovena Le feels patient can be DC to home. She will FU as planned.  Signed, Richardson Dopp, PA-C   05/14/2014 9:00 AM    EP Attending  Patient seen and examined. See my note for additional details. Agree with Mr. Kathleen Argue assessment and plan.   Mikle Bosworth.D.

## 2014-05-14 NOTE — Progress Notes (Addendum)
   ELECTROPHYSIOLOGY ROUNDING NOTE    Patient Name: Michelle Winters Date of Encounter: 05/14/2014    SUBJECTIVE:Patient feels well this morning.  No chest pain or shortness of breath.  Ambulated frequently yesterday without problems.   Echo 05-13-14 demonstrated EF 40% with hypokinesis of base-anterolateral segment.  EF previously 50-55%.   TELEMETRY: Reviewed telemetry pt in atrial pacing with intrinsic ventricular conduction Filed Vitals:   05/13/14 1650 05/13/14 2014 05/13/14 2355 05/14/14 0413  BP: 121/63 136/63 117/71 127/56  Pulse: 60 60 60 60  Temp: 97.7 F (36.5 C) 98 F (36.7 C) 98.1 F (36.7 C) 97.8 F (36.6 C)  TempSrc: Oral Oral Oral Oral  Resp: 16 18 17 18   Height:      Weight:      SpO2: 99% 99% 100% 97%    Intake/Output Summary (Last 24 hours) at 05/14/14 7169 Last data filed at 05/14/14 0000  Gross per 24 hour  Intake    800 ml  Output   2850 ml  Net  -2050 ml    CURRENT MEDICATIONS: . aspirin EC  81 mg Oral Daily  . hydrochlorothiazide  12.5 mg Oral Daily  . levothyroxine  50 mcg Oral QAC breakfast  . losartan  50 mg Oral Daily  . metoprolol succinate  25 mg Oral Daily    Radiology/Studies: Dg Chest Port 1 View 05/13/2014   CLINICAL DATA:  Left pneumothorax after pacemaker placement.  EXAM: PORTABLE CHEST - 1 VIEW  COMPARISON:  05/12/2014  FINDINGS: Left apical pneumothorax shows reduction in size since the prior exam. Estimated residual pneumothorax is approximately 5-10% in volume. No pulmonary consolidation, mediastinal shift or edema. No pleural fluid identified. Pacemaker shows stable positioning.  IMPRESSION: Decrease in left pneumothorax with residual roughly 5-10% apical pneumothorax remaining.   Electronically Signed   By: Aletta Edouard M.D.   On: 05/13/2014 08:16     PHYSICAL EXAM Well appearing elderly woman, NAD HEENT: Unremarkable,[place], AT Neck:  6 JVD, no thyromegally Back:  No CVA tenderness Lungs:  Clear with no wheezes, rales,  or rhonchi HEART:  Regular rate rhythm, no murmurs, no rubs, no clicks Abd:  soft, positive bowel sounds, no organomegally, no rebound, no guarding Ext:  2 plus pulses, no edema, no cyanosis, no clubbing Skin:  No rashes no nodules Neuro:  CN II through XII intact, motor grossly intact    Principal Problem:   Symptomatic bradycardia Active Problems:   Hypertension   S/P cardiac pacemaker procedure 05/11/14 Medtronic   Pneumothorax on right    Repeat CXR pending this morning.  If no progression of ptx, will plan discharge to home today.  Usual followup.  Echo reviewed.  Patient is without ischemic symptoms.  Nuclear study 04-2013 demonstrated EF 49%, intermediate risk.  [place] 06-2013 demonstrated normal coronaries.   Mikle Bosworth.D.

## 2014-05-17 ENCOUNTER — Telehealth: Payer: Self-pay | Admitting: *Deleted

## 2014-05-17 NOTE — Telephone Encounter (Signed)
Patient calls in per Dr. Caryl Comes request. She explains that the rib pain she had post implant has resolved and is no longer bothering her at this time. She also tells me that they started her on Metoprolol in the hospital and she was having stomach pains, but not at this time. She will continue to monitor this and call back if persists. Reminded her of wound check appt next Monday. She verbalized understanding.

## 2014-05-20 ENCOUNTER — Telehealth: Payer: Self-pay | Admitting: Physician Assistant

## 2014-05-20 NOTE — Telephone Encounter (Signed)
New message     TCM appt on 05-27-14 per afterhrs work sheet.dp

## 2014-05-20 NOTE — Telephone Encounter (Signed)
Spoke with patient and she is doing great.   She is aware of both of her appointments for wound check and Richardson Dopp, PA.

## 2014-05-23 ENCOUNTER — Ambulatory Visit (INDEPENDENT_AMBULATORY_CARE_PROVIDER_SITE_OTHER): Payer: Medicare Other | Admitting: *Deleted

## 2014-05-23 DIAGNOSIS — R001 Bradycardia, unspecified: Secondary | ICD-10-CM

## 2014-05-23 LAB — MDC_IDC_ENUM_SESS_TYPE_INCLINIC
Brady Statistic AP VS Percent: 91.95 %
Brady Statistic AS VP Percent: 0.04 %
Brady Statistic RA Percent Paced: 92.41 %
Date Time Interrogation Session: 20151026123111
Lead Channel Impedance Value: 399 Ohm
Lead Channel Impedance Value: 475 Ohm
Lead Channel Pacing Threshold Amplitude: 0.5 V
Lead Channel Pacing Threshold Amplitude: 0.75 V
Lead Channel Pacing Threshold Pulse Width: 0.4 ms
Lead Channel Sensing Intrinsic Amplitude: 16.5 mV
Lead Channel Sensing Intrinsic Amplitude: 19.125 mV
Lead Channel Sensing Intrinsic Amplitude: 4 mV
Lead Channel Setting Pacing Amplitude: 3.5 V
MDC IDC MSMT BATTERY VOLTAGE: 3.1 V
MDC IDC MSMT LEADCHNL RA IMPEDANCE VALUE: 380 Ohm
MDC IDC MSMT LEADCHNL RA IMPEDANCE VALUE: 456 Ohm
MDC IDC MSMT LEADCHNL RV PACING THRESHOLD PULSEWIDTH: 0.4 ms
MDC IDC SET LEADCHNL RA PACING AMPLITUDE: 3.5 V
MDC IDC SET LEADCHNL RV PACING PULSEWIDTH: 0.4 ms
MDC IDC SET LEADCHNL RV SENSING SENSITIVITY: 2 mV
MDC IDC SET ZONE DETECTION INTERVAL: 400 ms
MDC IDC STAT BRADY AP VP PERCENT: 0.45 %
MDC IDC STAT BRADY AS VS PERCENT: 7.56 %
MDC IDC STAT BRADY RV PERCENT PACED: 0.49 %
Zone Setting Detection Interval: 400 ms

## 2014-05-23 NOTE — Progress Notes (Signed)
Wound check appointment. Steri-strips removed. Wound without redness or edema. Incision edges approximated, wound well healed. Normal device function. Thresholds, sensing, and impedances consistent with implant measurements. Device programmed at 3.5V/auto capture programmed on for extra safety margin until 3 month visit. Histogram distribution appropriate for patient and level of activity. No mode switches or high ventricular rates noted.  190.2 PVC's/hour.  Rate response on today.   Patient educated about wound care, arm mobility, lifting restrictions. ROV in 3 months with implanting physician.

## 2014-05-27 ENCOUNTER — Ambulatory Visit (INDEPENDENT_AMBULATORY_CARE_PROVIDER_SITE_OTHER): Payer: Medicare Other | Admitting: Physician Assistant

## 2014-05-27 ENCOUNTER — Ambulatory Visit
Admission: RE | Admit: 2014-05-27 | Discharge: 2014-05-27 | Disposition: A | Discharge status: Home / Self Care | Payer: Medicare Other | Source: Ambulatory Visit | Attending: Physician Assistant | Admitting: Physician Assistant

## 2014-05-27 ENCOUNTER — Encounter: Payer: Self-pay | Admitting: Physician Assistant

## 2014-05-27 VITALS — BP 143/90 | HR 76 | Ht 63.25 in | Wt 130.0 lb

## 2014-05-27 DIAGNOSIS — Z950 Presence of cardiac pacemaker: Secondary | ICD-10-CM | POA: Diagnosis not present

## 2014-05-27 DIAGNOSIS — R001 Bradycardia, unspecified: Secondary | ICD-10-CM

## 2014-05-27 DIAGNOSIS — J939 Pneumothorax, unspecified: Secondary | ICD-10-CM

## 2014-05-27 DIAGNOSIS — J8489 Other specified interstitial pulmonary diseases: Secondary | ICD-10-CM | POA: Diagnosis not present

## 2014-05-27 DIAGNOSIS — I429 Cardiomyopathy, unspecified: Secondary | ICD-10-CM | POA: Diagnosis not present

## 2014-05-27 DIAGNOSIS — I472 Ventricular tachycardia: Secondary | ICD-10-CM | POA: Diagnosis not present

## 2014-05-27 DIAGNOSIS — I10 Essential (primary) hypertension: Secondary | ICD-10-CM | POA: Diagnosis not present

## 2014-05-27 DIAGNOSIS — R000 Tachycardia, unspecified: Secondary | ICD-10-CM | POA: Insufficient documentation

## 2014-05-27 NOTE — Progress Notes (Addendum)
Cardiology Office Note   Date:  05/27/2014   ID:  Michelle Winters, DOB [birth year], MRN [id number]  PCP:  Michelle Argyle, MD  Cardiologist:  Dr. Fransico Him   Electrophysiologist:  Dr. Virl Winters    History of Present Illness: Michelle Winters is a 80 y.o. female with a hx of HTN, normal coronary arteries by LHC in 2014, symptomatic PVCs (RBBB morphology), SVT, NSVT, bradycardia, hypothyroidism.  Patient was seen by Dr. Fransico Him in 11/2013 for syncope.  Referral back to EP for loop recorder was recommended.  But, she declined.  She was admitted in August 2015 with near syncope and an event monitor was placed.  She was then readmitted with symptomatic PSVT noted on her event monitor.  Tele demonstrated bradycardia and pauses up to 2 secs.  Review of event monitor by EP did not demonstrate SVT.  NSVT was noted and frequent ventricular ectopy.  Amiodarone was recommended.  Patient declined starting beta blocker therapy (she notes significant bradycardia in the past with this).   Event monitor demonstrated profound bradycardia with HR in the 20s.  Amiodarone was DCd.  She saw Dr. Caryl Winters in FU and PPM was recommend due to tachy-brady syndrome.    Patient was admitted 10/14-10/17.  Implantation of Medtronic dual chamber PPM was complicated by 03% L upper PTX.  She was followed by pulmonology who recommended conservative management.  FU echo was done to rule out an effusion.  There was no effusion, but she did have lower EF at 40%. Echo was reviewed by Dr. Cristopher Winters who also reviewed prior nuclear study and LHC.  No further workup was needed.  She returns for FU.  She is overall doing well. She does feel more fatigued at the end of the day. She denies chest discomfort, significant dyspnea, orthopnea, PND or edema. She denies syncope or near syncope. She denies rapid palpitations.  Studies:  - LHC (12/14):  Normal coronary arteries, EF 50-55%  - Echo (8/15):  EF 50-55%, inf AK, apical  lateral and inf-lat HK, trivial AI, mild MR, mild LAE.    - Echo (10/15):  Inferior and inferolateral AK,  Anterolateral HK, EF 40%.  Pericardium, extracardiac: There was no pericardial effusion.  - Nuclear (10/14):  Ant defect, EF 49%; Int risk   Recent Labs/Images: 03/20/2014: ALT 10  03/25/2014: TSH 6.150*  05/06/2014: Creatinine 1.0; Hemoglobin 12.6; Potassium 4.0   Dg Chest 2 View  05/12/2014   IMPRESSION: Dual lead cardiac pacemaker in place. There is about 10% left upper pneumothorax. I discussed with patient's nurse [name]   Electronically Signed   By: [name] M.D.   On: 05/12/2014 08:12    Wt Readings from Last 3 Encounters:  05/27/14 130 lb (58.968 kg)  05/13/14 135 lb 5.8 oz (61.4 kg)  05/13/14 135 lb 5.8 oz (61.4 kg)     Past Medical History  Diagnosis Date  . Heart palpitations     a. paroxysmal SVT, PVCs, NSVT. b. echo 03/27/14: EF 00-92%, nl systolic function, AK of basalinferior myocardium, possible HK of the apicallateral and inferolateral myocardium, Ao trivial regurg, mild MR, mild LAE, trivial pulmonic regurg  . Hypertension   . Bradycardia   . Osteopenia 06/2012 T score -1.9 FRAX    13%/3.6%  . CAD (coronary artery disease)     a. cath 07/01/13: LM no obs dz, LAD widely patent no obs dz, LCx normal in appearance, RCA  small, nondominant vessel. Catheter-induced spasm at the ostium,  EF 50-55%  . Wide-complex tachycardia 03/25/2014  . Hypothyroidism   . Cystitis 1950"s    "tube to bladder was in a kink & I had polyps; surgically corrected"  . Pacemaker     a.  Medtronic MRI compatible ADVISA pulse generator serial number [id number].  . Heart murmur     "slight"  . Arthritis     "hands, fingers, toes" (05/11/2014)  . Chronic kidney disease (CKD), stage III (moderate)     Current Outpatient Prescriptions  Medication Sig Dispense Refill  . aspirin 81 MG tablet Take 81 mg by mouth daily.      . cyanocobalamin (,VITAMIN B-12,) 1000 MCG/ML injection Inject  1,000 mcg into the muscle every 30 (thirty) days.      . fluticasone (FLONASE) 50 MCG/ACT nasal spray Place 2 sprays into both nostrils daily as needed for allergies.       . hydrochlorothiazide (HYDRODIURIL) 25 MG tablet Take 12.5 mg by mouth daily.       [name] levothyroxine (SYNTHROID, LEVOTHROID) 50 MCG tablet Take 50 mcg by mouth daily.       [name] loratadine (CLARITIN) 10 MG tablet Take 10 mg by mouth daily as needed for allergies.       [name] losartan (COZAAR) 25 MG tablet Take 25 mg by mouth daily.      . metoprolol succinate (TOPROL-XL) 25 MG 24 hr tablet Take 1 tablet (25 mg total) by mouth daily.  30 tablet  6   No current facility-administered medications for this visit.     Allergies:   Lisinopril; Simvastatin; Welchol; Metoprolol; Amiodarone; and Zetia   Social History:  The patient  reports that she has never smoked. She has never used smokeless tobacco. She reports that she does not drink alcohol or use illicit drugs.   Family History:  The patient's family history includes Cancer in her sister; Diabetes in her sister; Heart attack in her father; Leukemia in her other; Multiple sclerosis in her mother; Nephritis in her other.   ROS:  Please see the history of present illness.      All other systems reviewed and negative.   PHYSICAL EXAM: VS:  BP 143/90  Pulse 76  Ht 5' 3.25" (1.607 m)  Wt 130 lb (58.968 kg)  BMI 22.83 kg/m2 Well nourished, well developed, in no acute distress HEENT: normal Neck: no JVD Cardiac:  normal S1, S2; RRR; no murmur Lungs:  clear to auscultation bilaterally, no wheezing, rhonchi or rales Abd: soft, nontender, no hepatomegaly Ext: no edema Skin: warm and dry Neuro:  CNs 2-12 intact, no focal abnormalities noted  EKG:  Atrial paced, HR 76, PVC, nonspecific ST-T wave changes      ASSESSMENT AND PLAN:  1.  Symptomatic bradycardia S/P cardiac pacemaker procedure 05/11/14 [employer]tr[employer]:  Doing well.  She has had PPM wound check.      -  FU with Dr. Virl Winters as planned.  2.  Pneumothorax on left:  No chest discomfort.      -  Arrange FU CXR. 3.  Wide-complex tachycardia:  Controlled.  Continue beta blocker. 4.  Cardiomyopathy:  Question if this was related to tachycardia.  She had a normal cath last year.  No s/s of volume overload. She denies anginal symptoms.  WMA on echo c/w previous studies.  She has had some fatigue which may be from the beta blocker.  I offered to change this today, but she would like to hold off for now.    -  Continue beta blocker, ARB.  BP could tolerate further titration, but with fatigue will hold off for now.    -  Consider DC Toprol-XL and starting Carvedilol if fatigue continues    -  Plan FU echo 07/2014. 5.  Essential hypertension:  Borderline control.  Continue to monitor.   Disposition:  FU with Dr. Virl Winters 07/2014.  FU with Dr. Fransico Him 6 mos.   Signed, Versie Starks, MHS 05/27/2014 12:35 PM    [facility] [place], [place], [place]  [address] Phone: [phone]; Fax: [phone]

## 2014-05-27 NOTE — Patient Instructions (Signed)
Your physician wants you to follow-up in:  6 months with Dr. Radford Pax.  You will receive a reminder letter in the mail two months in advance. If you don't receive a letter, please call our office to schedule the follow-up appointment.  Your physician has requested that you have an echocardiogram. Echocardiography is a painless test that uses sound waves to create images of your heart. It provides your doctor with information about the size and shape of your heart and how well your heart's chambers and valves are working. This procedure takes approximately one hour. There are no restrictions for this procedure. To be done in January 2016--day or two prior to appt with Dr Caryl Comes.   Have chest X-ray done today at [facility]. Located at [address]

## 2014-05-30 ENCOUNTER — Encounter: Payer: Self-pay | Admitting: Physician Assistant

## 2014-06-07 ENCOUNTER — Encounter: Payer: Self-pay | Admitting: Internal Medicine

## 2014-06-13 DIAGNOSIS — E538 Deficiency of other specified B group vitamins: Secondary | ICD-10-CM | POA: Diagnosis not present

## 2014-06-13 DIAGNOSIS — G320 Subacute combined degeneration of spinal cord in diseases classified elsewhere: Secondary | ICD-10-CM | POA: Diagnosis not present

## 2014-07-07 ENCOUNTER — Encounter (HOSPITAL_COMMUNITY): Payer: Self-pay | Admitting: Cardiovascular Disease

## 2014-07-11 ENCOUNTER — Encounter: Payer: Medicare Other | Admitting: Gynecology

## 2014-07-15 DIAGNOSIS — D81818 Other biotin-dependent carboxylase deficiency: Secondary | ICD-10-CM | POA: Diagnosis not present

## 2014-08-11 ENCOUNTER — Encounter: Payer: Self-pay | Admitting: Internal Medicine

## 2014-08-12 ENCOUNTER — Encounter: Payer: Self-pay | Admitting: Physician Assistant

## 2014-08-12 ENCOUNTER — Ambulatory Visit (HOSPITAL_COMMUNITY): Payer: Medicare Other | Attending: Internal Medicine | Admitting: Cardiology

## 2014-08-12 DIAGNOSIS — I429 Cardiomyopathy, unspecified: Secondary | ICD-10-CM | POA: Diagnosis not present

## 2014-08-12 DIAGNOSIS — I10 Essential (primary) hypertension: Secondary | ICD-10-CM

## 2014-08-12 NOTE — Progress Notes (Signed)
Echo performed. 

## 2014-08-15 ENCOUNTER — Telehealth: Payer: Self-pay | Admitting: *Deleted

## 2014-08-15 NOTE — Telephone Encounter (Signed)
pt notified about echo results and EF improved ; with verbal understanding

## 2014-08-16 ENCOUNTER — Ambulatory Visit (INDEPENDENT_AMBULATORY_CARE_PROVIDER_SITE_OTHER): Payer: Medicare Other | Admitting: Internal Medicine

## 2014-08-16 ENCOUNTER — Encounter: Payer: Self-pay | Admitting: Internal Medicine

## 2014-08-16 VITALS — BP 148/70 | HR 91 | Ht 64.0 in | Wt 133.2 lb

## 2014-08-16 DIAGNOSIS — R001 Bradycardia, unspecified: Secondary | ICD-10-CM

## 2014-08-16 DIAGNOSIS — I472 Ventricular tachycardia: Secondary | ICD-10-CM

## 2014-08-16 DIAGNOSIS — I495 Sick sinus syndrome: Secondary | ICD-10-CM

## 2014-08-16 DIAGNOSIS — E538 Deficiency of other specified B group vitamins: Secondary | ICD-10-CM | POA: Diagnosis not present

## 2014-08-16 DIAGNOSIS — Z45018 Encounter for adjustment and management of other part of cardiac pacemaker: Secondary | ICD-10-CM | POA: Diagnosis not present

## 2014-08-16 DIAGNOSIS — I4729 Other ventricular tachycardia: Secondary | ICD-10-CM

## 2014-08-16 LAB — MDC_IDC_ENUM_SESS_TYPE_INCLINIC
Battery Remaining Longevity: 128 mo
Battery Voltage: 3.05 V
Brady Statistic AP VP Percent: 0.67 %
Brady Statistic AP VS Percent: 93.06 %
Brady Statistic AS VP Percent: 0.02 %
Brady Statistic AS VS Percent: 6.25 %
Brady Statistic RV Percent Paced: 0.69 %
Lead Channel Impedance Value: 475 Ohm
Lead Channel Pacing Threshold Amplitude: 0.5 V
Lead Channel Sensing Intrinsic Amplitude: 17.125 mV
Lead Channel Sensing Intrinsic Amplitude: 17.625 mV
Lead Channel Sensing Intrinsic Amplitude: 2.5 mV
Lead Channel Setting Pacing Amplitude: 1.5 V
Lead Channel Setting Pacing Amplitude: 2 V
Lead Channel Setting Pacing Pulse Width: 0.4 ms
Lead Channel Setting Sensing Sensitivity: 2 mV
MDC IDC MSMT LEADCHNL RA IMPEDANCE VALUE: 361 Ohm
MDC IDC MSMT LEADCHNL RA IMPEDANCE VALUE: 399 Ohm
MDC IDC MSMT LEADCHNL RA PACING THRESHOLD AMPLITUDE: 0.5 V
MDC IDC MSMT LEADCHNL RA PACING THRESHOLD PULSEWIDTH: 0.4 ms
MDC IDC MSMT LEADCHNL RA SENSING INTR AMPL: 2.5 mV
MDC IDC MSMT LEADCHNL RV IMPEDANCE VALUE: 513 Ohm
MDC IDC MSMT LEADCHNL RV PACING THRESHOLD PULSEWIDTH: 0.4 ms
MDC IDC SESS DTM: 20160119171709
MDC IDC SET ZONE DETECTION INTERVAL: 400 ms
MDC IDC STAT BRADY RA PERCENT PACED: 93.73 %
Zone Setting Detection Interval: 400 ms

## 2014-08-16 NOTE — Progress Notes (Signed)
Electrophysiology Office Note   Date:  08/16/2014   ID:  Michelle Winters, DOB [birth year], MRN [id number]  PCP:  Mathews Argyle, MD  Cardiologist:   Primary Electrophysiologist:    Virl Axe, MD    Chief Complaint  Patient presents with  . Appointment     History of Present Illness: Michelle Winters is a 81 y.o. female  Seen in follow-up for pacemaker implantation for symptomatic bradycardia accomplished 10/15. The procedure was complicated by a small apical pneumothorax that was followed noninvasively.  Echo 05-13-14 demonstrated EF 40% with hypokinesis of base-anterolateral segment. EF previously 50-55%.     Today, she denies  symptoms of palpitations, chest pain, shortness of breath, orthopnea, PND, lower extremity edema, claudication, dizziness, presyncope, syncope, bleeding, or neurologic sequela. The patient is tolerating medications without difficulties and is otherwise without complaint today.   She is much improved post pacing    Past Medical History  Diagnosis Date  . Heart palpitations     a. paroxysmal SVT, PVCs, NSVT. b. echo 03/27/14: EF 33-82%, nl systolic function, AK of basalinferior myocardium, possible HK of the apicallateral and inferolateral myocardium, Ao trivial regurg, mild MR, mild LAE, trivial pulmonic regurg  . Hypertension   . Bradycardia   . Osteopenia 06/2012 T score -1.9 FRAX    13%/3.6%  . CAD (coronary artery disease)     a. cath 07/01/13: LM no obs dz, LAD widely patent no obs dz, LCx normal in appearance, RCA  small, nondominant vessel. Catheter-induced spasm at the ostium, EF 50-55%  . Wide-complex tachycardia 03/25/2014  . Hypothyroidism   . Cystitis 1950"s    "tube to bladder was in a kink & I had polyps; surgically corrected"  . Pacemaker     a.  Medtronic MRI compatible ADVISA pulse generator serial number [id number].  . Heart murmur     "slight"  . Arthritis     "hands, fingers, toes" (05/11/2014)  . Chronic kidney  disease (CKD), stage III (moderate)   . Cardiomyopathy     a. Echo 10/15: Inferior and inferolateral AK,  Anterolateral HK, EF 40%;  b.  Echo (1/16):  EF 55-60%, Gr 1 DD, PASP 27, mild to mod TR, trivial AI, mild MR   Past Surgical History  Procedure Laterality Date  . Cholecystectomy    . Dilation and curettage of uterus    . Hysteroscopy  1950's    "tube to bladder was in a kink & I had polyps; surgically corrected"  . Tonsillectomy    . Cardiac catheterization  06/2013    normal coronary arteries  . Pacemaker insertion  05/11/2014    MDT dual chamber Advisa pacemaker implanted by Dr Caryl Comes -- MRI compatible system  . Cataract extraction w/ intraocular lens  implant, bilateral Bilateral   . Left heart catheterization with coronary angiogram [name] 07/01/2013    Procedure: LEFT HEART CATHETERIZATION WITH CORONARY ANGIOGRAM;  Surgeon: Blane Ohara, MD;  Location: [facility] CATH LAB;  Service: Cardiovascular;  Laterality: [name];  . Permanent pacemaker insertion [name] 05/11/2014    Procedure: PERMANENT PACEMAKER INSERTION;  Surgeon: Deboraha Sprang, MD;  Location: [facility] CATH LAB;  Service: Cardiovascular;  Laterality: [name];     Current Outpatient Prescriptions  Medication Sig Dispense Refill  . aspirin 81 MG tablet Take 81 mg by mouth daily.    . cyanocobalamin (,VITAMIN B-12,) 1000 MCG/ML injection Inject 1,000 mcg into the muscle every 30 (thirty) days.    . fluticasone (FLONASE) 50 MCG/ACT nasal  spray Place 2 sprays into both nostrils daily as needed for allergies.     . hydrochlorothiazide (HYDRODIURIL) 25 MG tablet Take 12.5 mg by mouth daily.     [name] levothyroxine (SYNTHROID, LEVOTHROID) 50 MCG tablet Take 50 mcg by mouth daily.     [name] loratadine (CLARITIN) 10 MG tablet Take 10 mg by mouth daily as needed for allergies.     [name] losartan (COZAAR) 25 MG tablet Take 25 mg by mouth daily.    . metoprolol succinate (TOPROL-XL) 25 MG 24 hr tablet Take 1 tablet (25 mg total) by mouth daily. 30 tablet 6   No  current facility-administered medications for this visit.    Allergies:   Lisinopril; Simvastatin; Welchol; Metoprolol; Amiodarone; and Zetia   Social History:  The patient  reports that she has never smoked. She has never used smokeless tobacco. She reports that she does not drink alcohol or use illicit drugs.   Family History:  The patient's    family history includes Cancer in her sister; Diabetes in her sister; Heart attack in her father; Leukemia in her other; Multiple sclerosis in her mother; Nephritis in her other.    ROS:  Please see the history of present illness.   Otherwise, review of systems is positive for cough .   All other systems are reviewed and negative.    PHYSICAL EXAM: VS:  BP 148/70 mmHg  Pulse 91  Ht 5\' 4"  (1.626 m)  Wt 133 lb 3.2 oz (60.419 kg)  BMI 22.85 kg/m2 , BMI Body mass index is 22.85 kg/(m^2). GEN: Well nourished, well developed, in no acute distress HEENT: normal Neck: no JVD, carotid bruits, or masses Cardiac: IREGULAR RATE and RHYTHM ; no murmurs, rubs, No S4  Back without kyphosis or CVAT Respiratory:  clear to auscultation bilaterally, normal work of breathing GI: soft, nontender, nondistended, + BS MS: no deformity or atrophy Extremities no clubbing cyanosis  edema Skin: warm and dry,  device pocket is well healed Neuro:  Strength and sensation are intact Psych: euthymic mood, full affect  EKG:  EKG is ordered today. The ekg ordered today shows atrial pacing with frequent ventricular ectopy Atrial rate/pacing rate is about  75 there are frequent and interpolated PVCs and couplets    Device interrogation is reviewed today in detail.  See PaceArt for details.   Recent Labs: 03/19/2014: Magnesium 1.7 03/20/2014: ALT 10 03/25/2014: TSH 6.150* 05/06/2014: BUN 20; Creatinine 1.0; Hemoglobin 12.6; Platelets 200.0; Potassium 4.0; Sodium 138    Lipid Panel  No results found for: CHOL, TRIG, HDL, CHOLHDL, VLDL, LDLCALC, LDLDIRECT   Wt  Readings from Last 3 Encounters:  08/16/14 133 lb 3.2 oz (60.419 kg)  05/27/14 130 lb (58.968 kg)  05/13/14 135 lb 5.8 oz (61.4 kg)          ASSESSMENT AND PLAN:  Symptomatic bradycardia  Pacemaker-Medtronic The patient's device was interrogated.  The information was reviewed. No changes were made in the programming.    Ventricular tachycardia-nonsustained  hypertension    Current medicines are reviewed at length with the patient today.   The patient does not have concerns regarding her medicines.  The following changes were made today:  none  Labs/ tests ordered today include:    Orders Placed This Encounter  Procedures  . Implantable device check  . EKG 12-Lead     Disposition:   FU with me 9 month(s)  Signed, Virl Axe, MD  08/16/2014 5:19 PM     [facility]  [address] [place] [address] [phone] (office) [phone] (fax)

## 2014-08-16 NOTE — Patient Instructions (Signed)
Your physician recommends that you continue on your current medications as directed. Please refer to the Current Medication list given to you today.  Remote monitoring is used to monitor your Pacemaker of ICD from home. This monitoring reduces the number of office visits required to check your device to one time per year. It allows [place] to keep an eye on the functioning of your device to ensure it is working properly. You are scheduled for a device check from home on 11/15/14. You may send your transmission at any time that day. If you have a wireless device, the transmission will be sent automatically. After your physician reviews your transmission, you will receive a postcard with your next transmission date.  Your physician wants you to follow-up in: 9 months with Dr. Caryl Comes.  You will receive a reminder letter in the mail two months in advance. If you don't receive a letter, please call our office to schedule the follow-up appointment.

## 2014-08-31 ENCOUNTER — Encounter: Payer: Self-pay | Admitting: Cardiology

## 2014-09-01 ENCOUNTER — Encounter: Payer: Medicare Other | Admitting: Gynecology

## 2014-09-15 DIAGNOSIS — H43811 Vitreous degeneration, right eye: Secondary | ICD-10-CM | POA: Diagnosis not present

## 2014-09-15 DIAGNOSIS — H43392 Other vitreous opacities, left eye: Secondary | ICD-10-CM | POA: Diagnosis not present

## 2014-09-15 DIAGNOSIS — H35363 Drusen (degenerative) of macula, bilateral: Secondary | ICD-10-CM | POA: Diagnosis not present

## 2014-09-19 DIAGNOSIS — E538 Deficiency of other specified B group vitamins: Secondary | ICD-10-CM | POA: Diagnosis not present

## 2014-09-27 ENCOUNTER — Encounter: Payer: Self-pay | Admitting: Internal Medicine

## 2014-09-28 ENCOUNTER — Encounter: Payer: Self-pay | Admitting: Internal Medicine

## 2014-10-06 ENCOUNTER — Ambulatory Visit (INDEPENDENT_AMBULATORY_CARE_PROVIDER_SITE_OTHER): Payer: Medicare Other | Admitting: Gynecology

## 2014-10-06 ENCOUNTER — Encounter: Payer: Self-pay | Admitting: Gynecology

## 2014-10-06 VITALS — BP 120/76 | Ht 64.0 in | Wt 135.0 lb

## 2014-10-06 DIAGNOSIS — M8580 Other specified disorders of bone density and structure, unspecified site: Secondary | ICD-10-CM | POA: Diagnosis not present

## 2014-10-06 DIAGNOSIS — Z01419 Encounter for gynecological examination (general) (routine) without abnormal findings: Secondary | ICD-10-CM | POA: Diagnosis not present

## 2014-10-06 DIAGNOSIS — N952 Postmenopausal atrophic vaginitis: Secondary | ICD-10-CM

## 2014-10-06 NOTE — Progress Notes (Signed)
Michelle Winters 27-Jun-1932 [id number]        81 y.o.  G2P2002 for breast and pelvic exam. Several issues noted below.  Past medical history,surgical history, problem list, medications, allergies, family history and social history were all reviewed and documented as reviewed in the EPIC chart.  ROS:  Performed with pertinent positives and negatives included in the history, assessment and plan.   Additional significant findings :  Visual changes left eye for which she is seeing Dr. Deloria Lair   Exam: Michelle Winters assistant Filed Vitals:   10/06/14 1506  BP: 120/76  Height: 5\' 4"  (1.626 m)  Weight: 135 lb (61.236 kg)   General appearance:  Normal affect, orientation and appearance. Skin: Grossly normal excepting numerous benign-appearing seborrheic keratoses of her chest and back. HEENT: Without gross lesions.  No cervical or supraclavicular adenopathy. Thyroid normal.  Lungs:  Clear without wheezing, rales or rhonchi Cardiac: RR, without RMG Abdominal:  Soft, nontender, without masses, guarding, rebound, organomegaly or hernia Breasts:  Examined lying and sitting without masses, retractions, discharge or axillary adenopathy. Pelvic:  Ext/BUS/vagina with generalized atrophic changes. Left labia more generous as always. Blue domed cystic area left lower labia majora  Cervix atrophic, flush with the upper vagina  Uterus difficult to palpate. No gross masses or tenderness  Adnexa  Without gross masses or tenderness    Anus and perineum  Normal   Rectovaginal  Normal sphincter tone without palpated masses or tenderness.    Assessment/Plan:  81 y.o. [id number] female for breast and pelvic exam.   1. Postmenopausal/atrophic genital changes.  Without significant hot flushes, night sweats, vaginal dryness or any vaginal bleeding. Continue to monitor and report any vaginal bleeding. 2. Varicosities/blue dome type cystic area left lower labia. Present for years since birth of her last child unchanged.  Not bothersome to the patient. Continue to monitor and report any changes. 3. Osteopenia. DEXA 2013 with T score -1.9. FRAX 13%/3.6%. Had previously discussed treatment options both by myself and Dr. Cherylann Banas. Patient has rejected treatment in the past. Was to do a bone density last year but deferred secondary to cardiac issues. She is ready to schedule now and will follow up for this. 4. Pap smear 2012. No Pap smear done today. No history of significant abnormal Pap smears. We both agree to stop screening and she is comfortable with this per current screening guidelines. 5. Colonoscopy  2006. Received letter from her gastroenterologist who stated she no longer needs to have colonoscopy. 6. Mammography 2013. Deferred due to her cardiac issues last year. Patient will schedule now and agrees to do so. SBE monthly reviewed. 7. Health maintenance. No routine blood work done as this is done at her primary physician's office. Follow up 1 year, sooner as needed.     Anastasio Auerbach MD, 3:35 PM 10/06/2014

## 2014-10-06 NOTE — Patient Instructions (Signed)
Follow up for bone density as scheduled.  Call to Schedule your mammogram  Facilities in [place]: 1)  [facility], [place] Phone: [phone] 2)  [facility]. [address] [place] Phone: [phone] 3)  Dr. Isaiah Blakes at [facility] [address] Phone: [phone]     Mammogram A mammogram is an X-ray test to find changes in a woman's breast. You should get a mammogram if:  You are 79 years of age or older  You have risk factors.   Your doctor recommends that you have one.  BEFORE THE TEST  Do not schedule the test the week before your period, especially if your breasts are sore during this time.  On the day of your mammogram:  Wash your breasts and armpits well. After washing, do not put on any deodorant or talcum powder on until after your test.   Eat and drink as you usually do.   Take your medicines as usual.   If you are diabetic and take insulin, make sure you:   Eat before coming for your test.   Take your insulin as usual.   If you cannot keep your appointment, call before the appointment to cancel. Schedule another appointment.  TEST  You will need to undress from the waist up. You will put on a hospital gown.   Your breast will be put on the mammogram machine, and it will press firmly on your breast with a piece of plastic called a compression paddle. This will make your breast flatter so that the machine can X-ray all parts of your breast.   Both breasts will be X-rayed. Each breast will be X-rayed from above and from the side. An X-ray might need to be taken again if the picture is not good enough.   The mammogram will last about 15 to 30 minutes.  AFTER THE TEST Finding out the results of your test Ask when your test results will be ready. Make sure you get your test results.  Document Released: 10/11/2008 Document Revised: 07/04/2011 Document  Reviewed: 10/11/2008 [facility] Patient Information 2012 [place].

## 2014-10-07 LAB — URINALYSIS W MICROSCOPIC + REFLEX CULTURE
Bacteria, UA: NONE SEEN
Bilirubin Urine: NEGATIVE
CASTS: NONE SEEN
CRYSTALS: NONE SEEN
GLUCOSE, UA: NEGATIVE mg/dL
Ketones, ur: NEGATIVE mg/dL
Leukocytes, UA: NEGATIVE
Nitrite: NEGATIVE
PH: 5.5 (ref 5.0–8.0)
PROTEIN: NEGATIVE mg/dL
SQUAMOUS EPITHELIAL / LPF: NONE SEEN
Specific Gravity, Urine: 1.011 (ref 1.005–1.030)
Urobilinogen, UA: 0.2 mg/dL (ref 0.0–1.0)

## 2014-10-17 DIAGNOSIS — E538 Deficiency of other specified B group vitamins: Secondary | ICD-10-CM | POA: Diagnosis not present

## 2014-11-15 ENCOUNTER — Encounter: Payer: Self-pay | Admitting: Internal Medicine

## 2014-11-15 ENCOUNTER — Ambulatory Visit (INDEPENDENT_AMBULATORY_CARE_PROVIDER_SITE_OTHER): Payer: Medicare Other | Admitting: *Deleted

## 2014-11-15 DIAGNOSIS — I495 Sick sinus syndrome: Secondary | ICD-10-CM | POA: Diagnosis not present

## 2014-11-15 LAB — MDC_IDC_ENUM_SESS_TYPE_REMOTE
Battery Voltage: 3.03 V
Brady Statistic AP VS Percent: 85.5 %
Brady Statistic AS VP Percent: 0.05 %
Brady Statistic AS VS Percent: 13.12 %
Brady Statistic RA Percent Paced: 86.83 %
Brady Statistic RV Percent Paced: 1.38 %
Lead Channel Impedance Value: 418 Ohm
Lead Channel Impedance Value: 437 Ohm
Lead Channel Impedance Value: 494 Ohm
Lead Channel Pacing Threshold Amplitude: 0.75 V
Lead Channel Pacing Threshold Pulse Width: 0.4 ms
Lead Channel Pacing Threshold Pulse Width: 0.4 ms
Lead Channel Sensing Intrinsic Amplitude: 15.5 mV
Lead Channel Sensing Intrinsic Amplitude: 3.125 mV
Lead Channel Setting Pacing Amplitude: 2 V
Lead Channel Setting Pacing Pulse Width: 0.4 ms
Lead Channel Setting Sensing Sensitivity: 2 mV
MDC IDC MSMT BATTERY REMAINING LONGEVITY: 124 mo
MDC IDC MSMT LEADCHNL RA IMPEDANCE VALUE: 361 Ohm
MDC IDC MSMT LEADCHNL RA PACING THRESHOLD AMPLITUDE: 0.5 V
MDC IDC MSMT LEADCHNL RA SENSING INTR AMPL: 3.125 mV
MDC IDC MSMT LEADCHNL RV SENSING INTR AMPL: 15.5 mV
MDC IDC SESS DTM: 20160419233338
MDC IDC SET LEADCHNL RA PACING AMPLITUDE: 1.5 V
MDC IDC STAT BRADY AP VP PERCENT: 1.33 %
Zone Setting Detection Interval: 400 ms
Zone Setting Detection Interval: 400 ms

## 2014-11-16 NOTE — Progress Notes (Signed)
Remote pacemaker transmission.   

## 2014-11-21 DIAGNOSIS — H43812 Vitreous degeneration, left eye: Secondary | ICD-10-CM | POA: Diagnosis not present

## 2014-11-21 DIAGNOSIS — H43392 Other vitreous opacities, left eye: Secondary | ICD-10-CM | POA: Diagnosis not present

## 2014-11-21 DIAGNOSIS — E538 Deficiency of other specified B group vitamins: Secondary | ICD-10-CM | POA: Diagnosis not present

## 2014-11-21 DIAGNOSIS — H43811 Vitreous degeneration, right eye: Secondary | ICD-10-CM | POA: Diagnosis not present

## 2014-12-13 ENCOUNTER — Ambulatory Visit (INDEPENDENT_AMBULATORY_CARE_PROVIDER_SITE_OTHER): Payer: Medicare Other | Admitting: Cardiology

## 2014-12-13 ENCOUNTER — Encounter: Payer: Self-pay | Admitting: Cardiology

## 2014-12-13 VITALS — BP 130/80 | HR 76 | Ht 64.0 in | Wt 133.8 lb

## 2014-12-13 DIAGNOSIS — R001 Bradycardia, unspecified: Secondary | ICD-10-CM | POA: Diagnosis not present

## 2014-12-13 DIAGNOSIS — I10 Essential (primary) hypertension: Secondary | ICD-10-CM | POA: Diagnosis not present

## 2014-12-13 DIAGNOSIS — Z950 Presence of cardiac pacemaker: Secondary | ICD-10-CM

## 2014-12-13 DIAGNOSIS — R55 Syncope and collapse: Secondary | ICD-10-CM

## 2014-12-13 DIAGNOSIS — I493 Ventricular premature depolarization: Secondary | ICD-10-CM

## 2014-12-13 NOTE — Progress Notes (Signed)
Cardiology Office Note   Date:  12/13/2014   ID:  Michelle Winters, DOB 1932/06/24, MRN [id number]  PCP:  Michelle Argyle, MD    Chief Complaint  Patient presents with  . Follow-up    Syncope      History of Present Illness: Michelle Winters is a 81 y.o. female with a hx of HTN, normal coronary arteries by LHC in 2014, symptomatic PVCs (RBBB morphology), SVT, NSVT, bradycardia, hypothyroidism.  She was seen by me in 11/2013 for syncope. Referral back to EP for loop recorder was recommended. But, she declined. She was admitted in August 2015 with near syncope and an event monitor was placed. She was then readmitted with symptomatic PSVT noted on her event monitor. Tele demonstrated bradycardia and pauses up to 2 secs. Review of event monitor by EP did not demonstrate SVT. NSVT was noted and frequent ventricular ectopy. Amiodarone was recommended. Patient declined starting beta blocker therapy (she notes significant bradycardia in the past with this). Event monitor demonstrated profound bradycardia with HR in the 20s. Amiodarone was DCd. She saw Dr. Caryl Comes in FU and PPM was recommend due to tachy-brady syndrome.   Patient was admitted 10/14-10/17. Implantation of Medtronic dual chamber PPM was complicated by 63% L upper PTX. She was followed by pulmonology who recommended conservative management. FU echo was done to rule out an effusion. There was no effusion, but she did have lower EF at 40%. Echo was reviewed by Dr. [name] who also reviewed prior nuclear study and LHC. No further workup was needed. She returns for FU. She is overall doing well. She does feel more fatigued at the end of the day. She denies chest discomfort, significant dyspnea, orthopnea, PND or edema. She denies syncope or near syncope. She denies rapid palpitations.    Past Medical History  Diagnosis Date  . Heart palpitations     a. paroxysmal SVT, PVCs, NSVT. b. echo 03/27/14: EF 87-56%,  nl systolic function, AK of basalinferior myocardium, possible HK of the apicallateral and inferolateral myocardium, Ao trivial regurg, mild MR, mild LAE, trivial pulmonic regurg  . Hypertension   . Bradycardia   . Osteopenia 06/2012 T score -1.9 FRAX    13%/3.6%  . CAD (coronary artery disease)     a. cath 07/01/13: LM no obs dz, LAD widely patent no obs dz, LCx normal in appearance, RCA  small, nondominant vessel. Catheter-induced spasm at the ostium, EF 50-55%  . Wide-complex tachycardia 03/25/2014  . Hypothyroidism   . Cystitis 1950"s    "tube to bladder was in a kink & I had polyps; surgically corrected"  . Pacemaker     a.  Medtronic MRI compatible ADVISA pulse generator serial number [id number].  . Heart murmur     "slight"  . Arthritis     "hands, fingers, toes" (05/11/2014)  . Chronic kidney disease (CKD), stage III (moderate)   . Cardiomyopathy     a. Echo 10/15: Inferior and inferolateral AK,  Anterolateral HK, EF 40%;  b.  Echo (1/16):  EF 55-60%, Gr 1 DD, PASP 27, mild to mod TR, trivial AI, mild MR    Past Surgical History  Procedure Laterality Date  . Cholecystectomy    . Dilation and curettage of uterus    . Hysteroscopy  1950's    "tube to bladder was in a kink & I had polyps; surgically corrected"  . Tonsillectomy    . Cardiac catheterization  06/2013    normal coronary arteries  .  Pacemaker insertion  05/11/2014    MDT dual chamber Advisa pacemaker implanted by Dr Caryl Comes -- MRI compatible system  . Cataract extraction w/ intraocular lens  implant, bilateral Bilateral   . Left heart catheterization with coronary angiogram [name] 07/01/2013    Procedure: LEFT HEART CATHETERIZATION WITH CORONARY ANGIOGRAM;  Surgeon: Blane Ohara, MD;  Location: [facility] CATH LAB;  Service: Cardiovascular;  Laterality: [name];  . Permanent pacemaker insertion [name] 05/11/2014    Procedure: PERMANENT PACEMAKER INSERTION;  Surgeon: Deboraha Sprang, MD;  Location: [facility] CATH LAB;  Service:  Cardiovascular;  Laterality: [name];     Current Outpatient Prescriptions  Medication Sig Dispense Refill  . aspirin 81 MG tablet Take 81 mg by mouth daily.    . cyanocobalamin (,VITAMIN B-12,) 1000 MCG/ML injection Inject 1,000 mcg into the muscle every 30 (thirty) days.    . fluticasone (FLONASE) 50 MCG/ACT nasal spray Place 2 sprays into both nostrils daily as needed for allergies.     . hydrochlorothiazide (HYDRODIURIL) 25 MG tablet Take 12.5 mg by mouth daily.     [name] levothyroxine (SYNTHROID, LEVOTHROID) 50 MCG tablet Take 50 mcg by mouth daily.     [name] loratadine (CLARITIN) 10 MG tablet Take 10 mg by mouth daily as needed for allergies.     [name] losartan (COZAAR) 25 MG tablet Take 25 mg by mouth daily.    . metoprolol succinate (TOPROL-XL) 25 MG 24 hr tablet Take 1 tablet (25 mg total) by mouth daily. 30 tablet 6   No current facility-administered medications for this visit.    Allergies:   Lisinopril; Simvastatin; Welchol; Metoprolol; Amiodarone; and Zetia    Social History:  The patient  reports that she has never smoked. She has never used smokeless tobacco. She reports that she does not drink alcohol or use illicit drugs.   Family History:  The patient's family history includes Cancer in her sister; Diabetes in her sister; Heart attack in her father; Heart disease in her brother; Leukemia in her maternal grandmother; Multiple sclerosis in her mother; Nephritis in her paternal grandmother.    ROS:  Please see the history of present illness.   Otherwise, review of systems are positive for none.   All other systems are reviewed and negative.    PHYSICAL EXAM: VS:  BP 130/80 mmHg  Pulse 76  Ht 5\' 4"  (1.626 m)  Wt 133 lb 12.8 oz (60.691 kg)  BMI 22.96 kg/m2  SpO2 97% , BMI Body mass index is 22.96 kg/(m^2). GEN: Well nourished, well developed, in no acute distress HEENT: normal Neck: no JVD, carotid bruits, or masses Cardiac: RRR; no murmurs, rubs, or gallops,no edema    Respiratory:  clear to auscultation bilaterally, normal work of breathing GI: soft, nontender, nondistended, + BS MS: no deformity or atrophy Skin: warm and dry, no rash Neuro:  Strength and sensation are intact Psych: euthymic mood, full affect   EKG:  EKG is not ordered today.    Recent Labs: 03/19/2014: Magnesium 1.7 03/20/2014: ALT 10 03/25/2014: TSH 6.150* 05/06/2014: BUN 20; Creatinine 1.0; Hemoglobin 12.6; Platelets 200.0; Potassium 4.0; Sodium 138    Lipid Panel No results found for: CHOL, TRIG, HDL, CHOLHDL, VLDL, LDLCALC, LDLDIRECT    Wt Readings from Last 3 Encounters:  12/13/14 133 lb 12.8 oz (60.691 kg)  10/06/14 135 lb (61.236 kg)  08/16/14 133 lb 3.2 oz (60.419 kg)      ASSESSMENT AND PLAN:  1.  Syncope with no further reoccurrence.  She refused  EP eval or loop recorder. 2.  Normal coronary arteries by cath 3.  PVC's - asymptomatic on BB 4.  Bradycardia s/p PPM 5.  HTN - controlled on Losartan/HCTZ    Current medicines are reviewed at length with the patient today.  The patient does not have concerns regarding medicines.  The following changes have been made:  no change  Labs/ tests ordered today include: see above assessment and plan No orders of the defined types were placed in this encounter.     Disposition:   I have recommended that since her only cardiac issues are arrhythmia based, that she followup mainly with Dr. Caryl Comes in the future.  She agrees and has a f/u appt with him next fall.   Lurena Nida, MD  12/13/2014 11:45 AM    [facility] [address], [place], [place]  [address] Phone: [phone]; Fax: [phone]

## 2014-12-13 NOTE — Patient Instructions (Signed)
Medication Instructions:  Your physician recommends that you continue on your current medications as directed. Please refer to the Current Medication list given to you today.   Labwork: None  Testing/Procedures: None  Follow-Up: Your physician recommends that you schedule a follow-up appointment with Dr. Caryl Comes this fall.  Any Other Special Instructions Will Be Listed Below (If Applicable).

## 2014-12-16 ENCOUNTER — Encounter: Payer: Self-pay | Admitting: Cardiology

## 2014-12-16 ENCOUNTER — Other Ambulatory Visit: Payer: Self-pay | Admitting: Nurse Practitioner

## 2014-12-16 NOTE — Telephone Encounter (Signed)
Per note 5.17.16

## 2014-12-20 ENCOUNTER — Encounter: Payer: Self-pay | Admitting: Internal Medicine

## 2014-12-27 DIAGNOSIS — E538 Deficiency of other specified B group vitamins: Secondary | ICD-10-CM | POA: Diagnosis not present

## 2015-02-02 DIAGNOSIS — E538 Deficiency of other specified B group vitamins: Secondary | ICD-10-CM | POA: Diagnosis not present

## 2015-02-14 ENCOUNTER — Ambulatory Visit (INDEPENDENT_AMBULATORY_CARE_PROVIDER_SITE_OTHER): Payer: Medicare Other | Admitting: *Deleted

## 2015-02-14 DIAGNOSIS — I495 Sick sinus syndrome: Secondary | ICD-10-CM | POA: Diagnosis not present

## 2015-02-14 NOTE — Progress Notes (Signed)
Remote pacemaker transmission.   

## 2015-02-20 DIAGNOSIS — L989 Disorder of the skin and subcutaneous tissue, unspecified: Secondary | ICD-10-CM | POA: Diagnosis not present

## 2015-02-20 DIAGNOSIS — N183 Chronic kidney disease, stage 3 (moderate): Secondary | ICD-10-CM | POA: Diagnosis not present

## 2015-02-20 DIAGNOSIS — Z0000 Encounter for general adult medical examination without abnormal findings: Secondary | ICD-10-CM | POA: Diagnosis not present

## 2015-02-20 DIAGNOSIS — I129 Hypertensive chronic kidney disease with stage 1 through stage 4 chronic kidney disease, or unspecified chronic kidney disease: Secondary | ICD-10-CM | POA: Diagnosis not present

## 2015-02-20 DIAGNOSIS — I10 Essential (primary) hypertension: Secondary | ICD-10-CM | POA: Diagnosis not present

## 2015-02-20 DIAGNOSIS — E780 Pure hypercholesterolemia: Secondary | ICD-10-CM | POA: Diagnosis not present

## 2015-02-20 DIAGNOSIS — Z79899 Other long term (current) drug therapy: Secondary | ICD-10-CM | POA: Diagnosis not present

## 2015-02-20 DIAGNOSIS — E039 Hypothyroidism, unspecified: Secondary | ICD-10-CM | POA: Diagnosis not present

## 2015-02-20 DIAGNOSIS — Z1389 Encounter for screening for other disorder: Secondary | ICD-10-CM | POA: Diagnosis not present

## 2015-03-01 LAB — CUP PACEART REMOTE DEVICE CHECK
Battery Remaining Longevity: 112 mo
Battery Voltage: 3.02 V
Brady Statistic AP VP Percent: 0.87 %
Brady Statistic RA Percent Paced: 91.33 %
Brady Statistic RV Percent Paced: 0.89 %
Date Time Interrogation Session: 20160719141632
Lead Channel Impedance Value: 361 Ohm
Lead Channel Impedance Value: 418 Ohm
Lead Channel Impedance Value: 608 Ohm
Lead Channel Pacing Threshold Pulse Width: 0.4 ms
Lead Channel Pacing Threshold Pulse Width: 0.4 ms
Lead Channel Sensing Intrinsic Amplitude: 17.875 mV
Lead Channel Sensing Intrinsic Amplitude: 2.75 mV
Lead Channel Setting Pacing Amplitude: 2 V
Lead Channel Setting Pacing Pulse Width: 0.4 ms
Lead Channel Setting Sensing Sensitivity: 2 mV
MDC IDC MSMT LEADCHNL RA PACING THRESHOLD AMPLITUDE: 0.5 V
MDC IDC MSMT LEADCHNL RA SENSING INTR AMPL: 2.75 mV
MDC IDC MSMT LEADCHNL RV IMPEDANCE VALUE: 475 Ohm
MDC IDC MSMT LEADCHNL RV PACING THRESHOLD AMPLITUDE: 0.875 V
MDC IDC MSMT LEADCHNL RV SENSING INTR AMPL: 17.875 mV
MDC IDC SET LEADCHNL RA PACING AMPLITUDE: 1.5 V
MDC IDC SET ZONE DETECTION INTERVAL: 400 ms
MDC IDC STAT BRADY AP VS PERCENT: 90.46 %
MDC IDC STAT BRADY AS VP PERCENT: 0.02 %
MDC IDC STAT BRADY AS VS PERCENT: 8.65 %
Zone Setting Detection Interval: 400 ms

## 2015-03-07 DIAGNOSIS — E538 Deficiency of other specified B group vitamins: Secondary | ICD-10-CM | POA: Diagnosis not present

## 2015-03-14 ENCOUNTER — Encounter: Payer: Self-pay | Admitting: Cardiology

## 2015-03-21 ENCOUNTER — Encounter: Payer: Self-pay | Admitting: Internal Medicine

## 2015-03-27 DIAGNOSIS — L820 Inflamed seborrheic keratosis: Secondary | ICD-10-CM | POA: Diagnosis not present

## 2015-04-10 DIAGNOSIS — E538 Deficiency of other specified B group vitamins: Secondary | ICD-10-CM | POA: Diagnosis not present

## 2015-04-24 DIAGNOSIS — D2230 Melanocytic nevi of unspecified part of face: Secondary | ICD-10-CM | POA: Diagnosis not present

## 2015-04-24 DIAGNOSIS — L820 Inflamed seborrheic keratosis: Secondary | ICD-10-CM | POA: Diagnosis not present

## 2015-05-16 DIAGNOSIS — E538 Deficiency of other specified B group vitamins: Secondary | ICD-10-CM | POA: Diagnosis not present

## 2015-06-02 ENCOUNTER — Encounter: Payer: Self-pay | Admitting: Internal Medicine

## 2015-06-02 ENCOUNTER — Other Ambulatory Visit: Payer: Self-pay | Admitting: *Deleted

## 2015-06-02 ENCOUNTER — Ambulatory Visit (INDEPENDENT_AMBULATORY_CARE_PROVIDER_SITE_OTHER): Payer: Medicare Other | Admitting: Internal Medicine

## 2015-06-02 VITALS — BP 134/78 | HR 81 | Ht 64.0 in | Wt 134.8 lb

## 2015-06-02 DIAGNOSIS — R001 Bradycardia, unspecified: Secondary | ICD-10-CM

## 2015-06-02 LAB — CUP PACEART INCLINIC DEVICE CHECK
Battery Remaining Longevity: 110 mo
Battery Voltage: 3.02 V
Brady Statistic AP VP Percent: 1.04 %
Brady Statistic AS VP Percent: 0.03 %
Brady Statistic RA Percent Paced: 91.27 %
Brady Statistic RV Percent Paced: 1.07 %
Implantable Lead Implant Date: 20151014
Implantable Lead Implant Date: 20151014
Implantable Lead Location: 753859
Implantable Lead Model: 5076
Implantable Lead Model: 5076
Lead Channel Impedance Value: 418 Ohm
Lead Channel Impedance Value: 456 Ohm
Lead Channel Impedance Value: 589 Ohm
Lead Channel Pacing Threshold Amplitude: 0.5 V
Lead Channel Pacing Threshold Amplitude: 0.875 V
Lead Channel Sensing Intrinsic Amplitude: 2.5 mV
Lead Channel Sensing Intrinsic Amplitude: 2.5 mV
Lead Channel Sensing Intrinsic Amplitude: 24.75 mV
Lead Channel Setting Pacing Amplitude: 2 V
Lead Channel Setting Pacing Pulse Width: 0.4 ms
Lead Channel Setting Sensing Sensitivity: 2 mV
MDC IDC LEAD LOCATION: 753860
MDC IDC MSMT LEADCHNL RA IMPEDANCE VALUE: 342 Ohm
MDC IDC MSMT LEADCHNL RA PACING THRESHOLD PULSEWIDTH: 0.4 ms
MDC IDC MSMT LEADCHNL RV PACING THRESHOLD PULSEWIDTH: 0.4 ms
MDC IDC MSMT LEADCHNL RV SENSING INTR AMPL: 18.125 mV
MDC IDC SESS DTM: 20161104125640
MDC IDC SET LEADCHNL RV PACING AMPLITUDE: 2.5 V
MDC IDC STAT BRADY AP VS PERCENT: 90.22 %
MDC IDC STAT BRADY AS VS PERCENT: 8.71 %

## 2015-06-02 NOTE — Progress Notes (Signed)
Electrophysiology Office Note   Date:  06/02/2015   ID:  Michelle Winters, DOB [birth year], MRN [id number]  PCP:  Mathews Argyle, MD  Cardiologist:   Primary Electrophysiologist:    Virl Axe, MD    Chief Complaint  Patient presents with  . Tachy-brady syndrome     History of Present Illness: Michelle Winters is a 81 y.o. female  Seen in follow-up for pacemaker implantation for symptomatic bradycardia accomplished 10/15. The procedure was complicated by a small apical pneumothorax that was followed noninvasively.  Echo 05-13-14 demonstrated EF 40% with hypokinesis of base-anterolateral segment. EF previously 50-55%.     Today, she denies  symptoms of palpitations, chest pain, shortness of breath, orthopnea, PND, lower extremity edema, claudication, dizziness, presyncope, syncope, bleeding, or neurologic sequela. The patient is tolerating medications without difficulties and is otherwise without complaint today.   She is much improved post pacing    Past Medical History  Diagnosis Date  . Heart palpitations     a. paroxysmal SVT, PVCs, NSVT. b. echo 03/27/14: EF 40-98%, nl systolic function, AK of basalinferior myocardium, possible HK of the apicallateral and inferolateral myocardium, Ao trivial regurg, mild MR, mild LAE, trivial pulmonic regurg  . Hypertension   . Bradycardia   . Osteopenia 06/2012 T score -1.9 FRAX    13%/3.6%  . CAD (coronary artery disease)     a. cath 07/01/13: LM no obs dz, LAD widely patent no obs dz, LCx normal in appearance, RCA  small, nondominant vessel. Catheter-induced spasm at the ostium, EF 50-55%  . Wide-complex tachycardia ([place]) 03/25/2014  . Hypothyroidism   . Cystitis 1950"s    "tube to bladder was in a kink & I had polyps; surgically corrected"  . Pacemaker     a.  Medtronic MRI compatible ADVISA pulse generator serial number [id number].  . Heart murmur     "slight"  . Arthritis     "hands, fingers, toes" (05/11/2014)  .  Chronic kidney disease (CKD), stage III (moderate)   . Cardiomyopathy ([place])     a. Echo 10/15: Inferior and inferolateral AK,  Anterolateral HK, EF 40%;  b.  Echo (1/16):  EF 55-60%, Gr 1 DD, PASP 27, mild to mod TR, trivial AI, mild MR   Past Surgical History  Procedure Laterality Date  . Cholecystectomy    . Dilation and curettage of uterus    . Hysteroscopy  1950's    "tube to bladder was in a kink & I had polyps; surgically corrected"  . Tonsillectomy    . Cardiac catheterization  06/2013    normal coronary arteries  . Pacemaker insertion  05/11/2014    MDT dual chamber Advisa pacemaker implanted by Dr Caryl Comes -- MRI compatible system  . Cataract extraction w/ intraocular lens  implant, bilateral Bilateral   . Left heart catheterization with coronary angiogram [name] 07/01/2013    Procedure: LEFT HEART CATHETERIZATION WITH CORONARY ANGIOGRAM;  Surgeon: Blane Ohara, MD;  Location: [facility] CATH LAB;  Service: Cardiovascular;  Laterality: [name];  . Permanent pacemaker insertion [name] 05/11/2014    Procedure: PERMANENT PACEMAKER INSERTION;  Surgeon: Deboraha Sprang, MD;  Location: [facility] CATH LAB;  Service: Cardiovascular;  Laterality: [name];     Current Outpatient Prescriptions  Medication Sig Dispense Refill  . aspirin 81 MG tablet Take 81 mg by mouth daily.    . cyanocobalamin (,VITAMIN B-12,) 1000 MCG/ML injection Inject 1,000 mcg into the muscle every 30 (thirty) days.    . fluticasone (FLONASE)  50 MCG/ACT nasal spray Place 2 sprays into both nostrils daily as needed for allergies.     . hydrochlorothiazide (HYDRODIURIL) 25 MG tablet Take 12.5 mg by mouth daily.     [name] levothyroxine (SYNTHROID, LEVOTHROID) 50 MCG tablet Take 50 mcg by mouth daily.     [name] loratadine (CLARITIN) 10 MG tablet Take 10 mg by mouth daily as needed for allergies.     [name] losartan (COZAAR) 25 MG tablet Take 25 mg by mouth daily.    . metoprolol succinate (TOPROL-XL) 25 MG 24 hr tablet TAKE 1 TABLET (25 MG TOTAL) BY MOUTH  DAILY. 30 tablet 6   No current facility-administered medications for this visit.    Allergies:   Lisinopril; Simvastatin; Welchol; Metoprolol; Amiodarone; and Zetia   Social History:  The patient  reports that she has never smoked. She has never used smokeless tobacco. She reports that she does not drink alcohol or use illicit drugs.   Family History:  The patient's    family history includes Cancer in her sister; Diabetes in her sister; Heart attack in her father; Heart disease in her brother; Leukemia in her maternal grandmother; Multiple sclerosis in her mother; Nephritis in her paternal grandmother.    ROS:  Please see the history of present illness.   Otherwise, review of systems is positive for cough .   All other systems are reviewed and negative.    PHYSICAL EXAM: VS:  BP 134/78 mmHg  Pulse 81  Ht 5\' 4"  (1.626 m)  Wt 134 lb 12.8 oz (61.145 kg)  BMI 23.13 kg/m2 , BMI Body mass index is 23.13 kg/(m^2). GEN: Well nourished, well developed, in no acute distress HEENT: normal Neck: no JVD, carotid bruits, or masses Cardiac: IREGULAR RATE and RHYTHM ; no murmurs, rubs, No S4  Back without kyphosis or CVAT Respiratory:  clear to auscultation bilaterally, normal work of breathing GI: soft, nontender, nondistended, + BS MS: no deformity or atrophy Extremities no clubbing cyanosis  edema Skin: warm and dry,  device pocket is well healed Neuro:  Strength and sensation are intact Psych: euthymic mood, full affect  EKG:  EKG is ordered today. The ekg ordered today shows atrial pacing with frequent ventricular ectopy Atrial rate/pacing rate is about  75 there are frequent and interpolated PVCs and couplets    Device interrogation is reviewed today in detail.  See PaceArt for details.   Recent Labs: No results found for requested labs within last 365 days.    Lipid Panel  No results found for: CHOL, TRIG, HDL, CHOLHDL, VLDL, LDLCALC, LDLDIRECT   Wt Readings from Last 3  Encounters:  06/02/15 134 lb 12.8 oz (61.145 kg)  12/13/14 133 lb 12.8 oz (60.691 kg)  10/06/14 135 lb (61.236 kg)          ASSESSMENT AND PLAN:  Symptomatic bradycardia  Pacemaker-Medtronic The patient's device was interrogated.  The information was reviewed. No changes were made in the programming.    Ventricular tachycardia-nonsustained  hypertension    Current medicines are reviewed at length with the patient today.   The patient does not have concerns regarding her medicines.  The following changes were made today:  none  Labs/ tests ordered today include:    No orders of the defined types were placed in this encounter.     Disposition:   FU with me 9 month(s)  Signed, Virl Axe, MD  06/02/2015 11:34 AM     [place]  [address] [place] [address] [phone] (office) [phone] (fax)

## 2015-06-02 NOTE — Patient Instructions (Signed)
Medication Instructions: - no changes  Labwork: - none  Procedures/Testing: - none  Follow-Up: - Remote monitoring is used to monitor your Pacemaker of ICD from home. This monitoring reduces the number of office visits required to check your device to one time per year. It allows [place] to keep an eye on the functioning of your device to ensure it is working properly. You are scheduled for a device check from home on 09/04/15. You may send your transmission at any time that day. If you have a wireless device, the transmission will be sent automatically. After your physician reviews your transmission, you will receive a postcard with your next transmission date.  - Your physician wants you to follow-up in: 1 year with Chanetta Marshall, NP for Dr. Caryl Comes. You will receive a reminder letter in the mail two months in advance. If you don't receive a letter, please call our office to schedule the follow-up appointment.  Any Additional Special Instructions Will Be Listed Below (If Applicable). - none

## 2015-06-05 ENCOUNTER — Other Ambulatory Visit: Payer: Self-pay | Admitting: *Deleted

## 2015-06-05 ENCOUNTER — Encounter: Payer: Self-pay | Admitting: *Deleted

## 2015-06-05 NOTE — Patient Outreach (Addendum)
High Risk Patient - screened - requested by Dr. Felipa Eth. Mrs. Cancio is an active 81 year old lady who is the primary caregiver for her husband. They have a son that resides with them that is helpful and another son that lives locally. Mrs. Carrillo's health issues are well controlled. She does have a pacemaker. She does not take the flu vaccine as she reports she got very sick after having one years ago. She said Dr. Felipa Eth has tried to convince her to have one but she still refuses. She has had a pneumovaccine and the shingles vaccine.  She does not feel she needs our services at this time but would like information for the future. I will send her a letter and a number for an emergency alert she may be interested in for her husband as he is at risk for falls. I will be including our [facility] magnet with the 24 hour nurse line number.  Deloria Lair GNP-BC [facility] [phone]  I am notifying Dr. Felipa Eth that the pt was screened.

## 2015-06-20 DIAGNOSIS — E538 Deficiency of other specified B group vitamins: Secondary | ICD-10-CM | POA: Diagnosis not present

## 2015-06-30 DIAGNOSIS — H353131 Nonexudative age-related macular degeneration, bilateral, early dry stage: Secondary | ICD-10-CM | POA: Diagnosis not present

## 2015-06-30 DIAGNOSIS — H26492 Other secondary cataract, left eye: Secondary | ICD-10-CM | POA: Diagnosis not present

## 2015-07-12 ENCOUNTER — Other Ambulatory Visit: Payer: Self-pay | Admitting: Internal Medicine

## 2015-07-25 DIAGNOSIS — E538 Deficiency of other specified B group vitamins: Secondary | ICD-10-CM | POA: Diagnosis not present

## 2015-08-11 ENCOUNTER — Other Ambulatory Visit: Payer: Self-pay | Admitting: Ophthalmology

## 2015-08-11 DIAGNOSIS — G453 Amaurosis fugax: Secondary | ICD-10-CM | POA: Diagnosis not present

## 2015-08-11 DIAGNOSIS — H26492 Other secondary cataract, left eye: Secondary | ICD-10-CM | POA: Diagnosis not present

## 2015-08-14 ENCOUNTER — Ambulatory Visit
Admission: RE | Admit: 2015-08-14 | Discharge: 2015-08-14 | Disposition: A | Discharge status: Home / Self Care | Payer: Medicare Other | Source: Ambulatory Visit | Attending: Ophthalmology | Admitting: Ophthalmology

## 2015-08-14 DIAGNOSIS — G453 Amaurosis fugax: Secondary | ICD-10-CM

## 2015-08-14 DIAGNOSIS — I6523 Occlusion and stenosis of bilateral carotid arteries: Secondary | ICD-10-CM | POA: Diagnosis not present

## 2015-08-18 ENCOUNTER — Other Ambulatory Visit: Payer: Self-pay | Admitting: Geriatric Medicine

## 2015-08-18 DIAGNOSIS — E041 Nontoxic single thyroid nodule: Secondary | ICD-10-CM

## 2015-08-21 DIAGNOSIS — I10 Essential (primary) hypertension: Secondary | ICD-10-CM | POA: Diagnosis not present

## 2015-08-23 ENCOUNTER — Ambulatory Visit
Admission: RE | Admit: 2015-08-23 | Discharge: 2015-08-23 | Disposition: A | Discharge status: Home / Self Care | Payer: Medicare Other | Source: Ambulatory Visit | Attending: Geriatric Medicine | Admitting: Geriatric Medicine

## 2015-08-23 DIAGNOSIS — E042 Nontoxic multinodular goiter: Secondary | ICD-10-CM | POA: Diagnosis not present

## 2015-08-23 DIAGNOSIS — E041 Nontoxic single thyroid nodule: Secondary | ICD-10-CM

## 2015-08-28 ENCOUNTER — Other Ambulatory Visit: Payer: Self-pay | Admitting: Geriatric Medicine

## 2015-08-28 DIAGNOSIS — E538 Deficiency of other specified B group vitamins: Secondary | ICD-10-CM | POA: Diagnosis not present

## 2015-08-28 DIAGNOSIS — E041 Nontoxic single thyroid nodule: Secondary | ICD-10-CM

## 2015-08-31 ENCOUNTER — Other Ambulatory Visit (HOSPITAL_COMMUNITY)
Admission: RE | Admit: 2015-08-31 | Discharge: 2015-08-31 | Disposition: A | Discharge status: Home / Self Care | Payer: Medicare Other | Source: Ambulatory Visit | Attending: Physician Assistant | Admitting: Physician Assistant

## 2015-08-31 ENCOUNTER — Ambulatory Visit
Admission: RE | Admit: 2015-08-31 | Discharge: 2015-08-31 | Disposition: A | Discharge status: Home / Self Care | Payer: Medicare Other | Source: Ambulatory Visit | Attending: Geriatric Medicine | Admitting: Geriatric Medicine

## 2015-08-31 DIAGNOSIS — E041 Nontoxic single thyroid nodule: Secondary | ICD-10-CM

## 2015-08-31 NOTE — Procedures (Signed)
Using direct ultrasound guidance, 4 passes were made using needles into the nodule within the right lobe of the thyroid.   Ultrasound was used to confirm needle placements on all occasions.   Specimens were sent to Pathology for analysis.   Jimmye Wisnieski S Meily Glowacki PA-C 05/23/2015 1:43 PM

## 2015-09-04 ENCOUNTER — Telehealth: Payer: Self-pay | Admitting: Cardiology

## 2015-09-04 ENCOUNTER — Ambulatory Visit (INDEPENDENT_AMBULATORY_CARE_PROVIDER_SITE_OTHER): Payer: Medicare Other | Admitting: *Deleted

## 2015-09-04 DIAGNOSIS — I495 Sick sinus syndrome: Secondary | ICD-10-CM | POA: Diagnosis not present

## 2015-09-04 DIAGNOSIS — H26492 Other secondary cataract, left eye: Secondary | ICD-10-CM | POA: Diagnosis not present

## 2015-09-04 NOTE — Telephone Encounter (Signed)
Spoke with pt and reminded pt of remote transmission that is due today. Pt verbalized understanding.   

## 2015-09-05 NOTE — Progress Notes (Signed)
Remote pacemaker transmission.   

## 2015-09-24 LAB — CUP PACEART REMOTE DEVICE CHECK
Battery Remaining Longevity: 99 mo
Battery Voltage: 3.01 V
Brady Statistic AP VP Percent: 0.86 %
Brady Statistic AP VS Percent: 94.59 %
Brady Statistic AS VS Percent: 4.53 %
Implantable Lead Implant Date: 20151014
Implantable Lead Location: 753859
Implantable Lead Model: 5076
Lead Channel Impedance Value: 456 Ohm
Lead Channel Impedance Value: 456 Ohm
Lead Channel Impedance Value: 589 Ohm
Lead Channel Pacing Threshold Amplitude: 0.5 V
Lead Channel Pacing Threshold Amplitude: 1 V
Lead Channel Sensing Intrinsic Amplitude: 16.375 mV
Lead Channel Sensing Intrinsic Amplitude: 2.75 mV
Lead Channel Sensing Intrinsic Amplitude: 2.75 mV
Lead Channel Setting Pacing Amplitude: 2 V
Lead Channel Setting Pacing Pulse Width: 0.4 ms
Lead Channel Setting Sensing Sensitivity: 2 mV
MDC IDC LEAD IMPLANT DT: 20151014
MDC IDC LEAD LOCATION: 753860
MDC IDC MSMT LEADCHNL RA IMPEDANCE VALUE: 361 Ohm
MDC IDC MSMT LEADCHNL RA PACING THRESHOLD PULSEWIDTH: 0.4 ms
MDC IDC MSMT LEADCHNL RV PACING THRESHOLD PULSEWIDTH: 0.4 ms
MDC IDC MSMT LEADCHNL RV SENSING INTR AMPL: 16.375 mV
MDC IDC SESS DTM: 20170202002806
MDC IDC SET LEADCHNL RV PACING AMPLITUDE: 2.5 V
MDC IDC STAT BRADY AS VP PERCENT: 0.01 %
MDC IDC STAT BRADY RA PERCENT PACED: 95.45 %
MDC IDC STAT BRADY RV PERCENT PACED: 0.87 %

## 2015-09-26 DIAGNOSIS — N390 Urinary tract infection, site not specified: Secondary | ICD-10-CM | POA: Diagnosis not present

## 2015-09-27 ENCOUNTER — Encounter: Payer: Self-pay | Admitting: Cardiology

## 2015-09-27 DIAGNOSIS — E538 Deficiency of other specified B group vitamins: Secondary | ICD-10-CM | POA: Diagnosis not present

## 2015-09-27 DIAGNOSIS — M8580 Other specified disorders of bone density and structure, unspecified site: Secondary | ICD-10-CM

## 2015-09-27 DIAGNOSIS — D440 Neoplasm of uncertain behavior of thyroid gland: Secondary | ICD-10-CM | POA: Diagnosis not present

## 2015-09-27 DIAGNOSIS — E042 Nontoxic multinodular goiter: Secondary | ICD-10-CM | POA: Diagnosis not present

## 2015-09-27 HISTORY — DX: Other specified disorders of bone density and structure, unspecified site: M85.80

## 2015-09-29 ENCOUNTER — Other Ambulatory Visit: Payer: Self-pay | Admitting: Surgery

## 2015-09-29 DIAGNOSIS — E041 Nontoxic single thyroid nodule: Secondary | ICD-10-CM

## 2015-10-04 ENCOUNTER — Other Ambulatory Visit (HOSPITAL_COMMUNITY)
Admission: RE | Admit: 2015-10-04 | Discharge: 2015-10-04 | Disposition: A | Discharge status: Home / Self Care | Payer: Medicare Other | Source: Ambulatory Visit | Attending: Surgery | Admitting: Surgery

## 2015-10-04 ENCOUNTER — Ambulatory Visit
Admission: RE | Admit: 2015-10-04 | Discharge: 2015-10-04 | Disposition: A | Discharge status: Home / Self Care | Payer: Medicare Other | Source: Ambulatory Visit | Attending: Surgery | Admitting: Surgery

## 2015-10-04 DIAGNOSIS — E041 Nontoxic single thyroid nodule: Secondary | ICD-10-CM | POA: Diagnosis not present

## 2015-10-24 ENCOUNTER — Ambulatory Visit
Admission: RE | Admit: 2015-10-24 | Discharge: 2015-10-24 | Disposition: A | Discharge status: Home / Self Care | Payer: Medicare Other | Source: Ambulatory Visit | Attending: Gynecology | Admitting: Gynecology

## 2015-10-24 ENCOUNTER — Other Ambulatory Visit: Payer: Self-pay

## 2015-10-24 ENCOUNTER — Ambulatory Visit
Admission: RE | Admit: 2015-10-24 | Discharge: 2015-10-24 | Disposition: A | Discharge status: Home / Self Care | Payer: Medicare Other | Source: Ambulatory Visit

## 2015-10-24 ENCOUNTER — Telehealth: Payer: Self-pay | Admitting: Gynecology

## 2015-10-24 DIAGNOSIS — M8580 Other specified disorders of bone density and structure, unspecified site: Secondary | ICD-10-CM

## 2015-10-24 DIAGNOSIS — M85851 Other specified disorders of bone density and structure, right thigh: Secondary | ICD-10-CM | POA: Diagnosis not present

## 2015-10-24 DIAGNOSIS — Z780 Asymptomatic menopausal state: Secondary | ICD-10-CM | POA: Diagnosis not present

## 2015-10-24 DIAGNOSIS — Z1231 Encounter for screening mammogram for malignant neoplasm of breast: Secondary | ICD-10-CM

## 2015-10-24 NOTE — Telephone Encounter (Signed)
Tell patient her recent bone density shows worsening loss of calcium from her bones. She has a higher hip fracture calculated risk.  My recommendation would be to consider going on medication like Fosamax to treat this. Recommend office visit to discuss

## 2015-10-25 ENCOUNTER — Other Ambulatory Visit: Payer: Self-pay | Admitting: Gynecology

## 2015-10-25 DIAGNOSIS — R928 Other abnormal and inconclusive findings on diagnostic imaging of breast: Secondary | ICD-10-CM

## 2015-10-25 NOTE — Telephone Encounter (Signed)
Pt informed with the below note, will call to schedule.  

## 2015-10-30 DIAGNOSIS — E538 Deficiency of other specified B group vitamins: Secondary | ICD-10-CM | POA: Diagnosis not present

## 2015-11-02 ENCOUNTER — Ambulatory Visit (INDEPENDENT_AMBULATORY_CARE_PROVIDER_SITE_OTHER): Payer: Medicare Other | Admitting: Gynecology

## 2015-11-02 ENCOUNTER — Encounter: Payer: Self-pay | Admitting: Gynecology

## 2015-11-02 ENCOUNTER — Other Ambulatory Visit (HOSPITAL_COMMUNITY): Payer: Self-pay | Admitting: Surgery

## 2015-11-02 VITALS — BP 118/74 | Ht 63.5 in | Wt 131.0 lb

## 2015-11-02 DIAGNOSIS — N952 Postmenopausal atrophic vaginitis: Secondary | ICD-10-CM

## 2015-11-02 DIAGNOSIS — Z01419 Encounter for gynecological examination (general) (routine) without abnormal findings: Secondary | ICD-10-CM

## 2015-11-02 DIAGNOSIS — D440 Neoplasm of uncertain behavior of thyroid gland: Secondary | ICD-10-CM

## 2015-11-02 DIAGNOSIS — M8580 Other specified disorders of bone density and structure, unspecified site: Secondary | ICD-10-CM | POA: Diagnosis not present

## 2015-11-02 NOTE — Progress Notes (Signed)
    Michelle Winters 08-Mar-1932 [id number]        82 y.o.  G2P2002  for breast and pelvic exam. Several issues noted below.  Past medical history,surgical history, problem list, medications, allergies, family history and social history were all reviewed and documented as reviewed in the EPIC chart.  ROS:  Performed with pertinent positives and negatives included in the history, assessment and plan.   Additional significant findings :  none   Exam: Caryn Bee assistant Filed Vitals:   11/02/15 1129  BP: 118/74  Height: 5' 3.5" (1.613 m)  Weight: 131 lb (59.421 kg)   General appearance:  Normal affect, orientation and appearance. Skin: Grossly normal HEENT: Without gross lesions.  No cervical or supraclavicular adenopathy. Thyroid normal.  Lungs:  Clear without wheezing, rales or rhonchi Cardiac: RR, without RMG Abdominal:  Soft, nontender, without masses, guarding, rebound, organomegaly or hernia Breasts:  Examined lying and sitting without masses, retractions, discharge or axillary adenopathy. Pelvic:  Ext/BUS/vagina with atrophic changes.  Blue domed cystic area left lower labia majora stable on serial exams  Cervix atrophic flush with upper vagina  Uterus difficult to palpate but no masses or tendernessntender   Adnexa without gross masses or tenderness    Anus and perineum normal   Rectovaginal normal sphincter tone without palpated masses or tenderness.    Assessment/Plan:  82 y.o. [id number] female for breast and pelvic exam.   1. Postmenopausal/atrophic genital changes. Without significant hot flushes, night sweats, vaginal dryness or any vaginal bleeding. Continue to monitor and report any issues or vaginal bleeding. 2. Left labia majora blue domed cyst. Present for years unchanged. Continue to monitor and report any changes. 3. Osteopenia. DEXA 09/2015 T score -2.4 FRAX 17%/6.5%. I reviewed with the patient her increased hip FRAX and options for treatment. We discussed her  risk of fracture and potential devastating sequelae. She understands this but declines any treatment at this time. 4. Pap smear 2012. No Pap smear done today. No history of significant abnormal Pap smears. We both agree to stop screening based on current screening guidelines. 5. Colonoscopy 2006 with reported recommendation to stop screening by her gastroenterologist. 6. Mammography 09/2015. Patient has recall for tomorrow due to asymmetry in the left breast and she is going to follow up for this. Exam today is normal. 7. Health maintenance. No routine lab work done as patient reports this done at her primary physician's office, follow up 1 year, sooner as needed.   Anastasio Auerbach MD, 11:55 AM 11/02/2015

## 2015-11-02 NOTE — Patient Instructions (Signed)

## 2015-11-03 ENCOUNTER — Ambulatory Visit
Admission: RE | Admit: 2015-11-03 | Discharge: 2015-11-03 | Disposition: A | Discharge status: Home / Self Care | Payer: Medicare Other | Source: Ambulatory Visit | Attending: Gynecology | Admitting: Gynecology

## 2015-11-03 DIAGNOSIS — R928 Other abnormal and inconclusive findings on diagnostic imaging of breast: Secondary | ICD-10-CM

## 2015-11-10 ENCOUNTER — Ambulatory Visit (HOSPITAL_COMMUNITY)
Admission: RE | Admit: 2015-11-10 | Discharge: 2015-11-10 | Disposition: A | Discharge status: Home / Self Care | Payer: Medicare Other | Source: Ambulatory Visit | Attending: Surgery | Admitting: Surgery

## 2015-11-10 DIAGNOSIS — E041 Nontoxic single thyroid nodule: Secondary | ICD-10-CM | POA: Insufficient documentation

## 2015-11-10 DIAGNOSIS — D440 Neoplasm of uncertain behavior of thyroid gland: Secondary | ICD-10-CM

## 2015-11-10 DIAGNOSIS — E0789 Other specified disorders of thyroid: Secondary | ICD-10-CM | POA: Diagnosis not present

## 2015-11-10 MED ORDER — LIDOCAINE HCL (PF) 1 % IJ SOLN
INTRAMUSCULAR | Status: AC
  Filled 2015-11-10: qty 10

## 2015-11-10 NOTE — Procedures (Signed)
Using direct ultrasound guidance, 5 passes were made using needles into the nodule within the right lobe of the thyroid.   Ultrasound was used to confirm needle placements on all occasions.   Specimens were sent to Pathology for analysis.  Gracelin Weisberg S Caelin Rayl PA-C 11/10/2015 2:00 PM

## 2015-11-21 ENCOUNTER — Encounter (HOSPITAL_COMMUNITY): Payer: Self-pay

## 2015-11-30 DIAGNOSIS — E538 Deficiency of other specified B group vitamins: Secondary | ICD-10-CM | POA: Diagnosis not present

## 2015-12-04 ENCOUNTER — Ambulatory Visit: Payer: Self-pay | Admitting: Surgery

## 2015-12-04 ENCOUNTER — Ambulatory Visit (INDEPENDENT_AMBULATORY_CARE_PROVIDER_SITE_OTHER): Payer: Medicare Other | Admitting: *Deleted

## 2015-12-04 DIAGNOSIS — I495 Sick sinus syndrome: Secondary | ICD-10-CM | POA: Diagnosis not present

## 2015-12-05 NOTE — Progress Notes (Signed)
Remote pacemaker transmission.   

## 2016-01-03 DIAGNOSIS — E538 Deficiency of other specified B group vitamins: Secondary | ICD-10-CM | POA: Diagnosis not present

## 2016-01-09 LAB — CUP PACEART REMOTE DEVICE CHECK
Brady Statistic AP VP Percent: 1.09 %
Brady Statistic AP VS Percent: 93.1 %
Brady Statistic AS VP Percent: 0.02 %
Brady Statistic AS VS Percent: 5.79 %
Brady Statistic RA Percent Paced: 94.2 %
Implantable Lead Implant Date: 20151014
Implantable Lead Location: 753859
Implantable Lead Location: 753860
Implantable Lead Model: 5076
Implantable Lead Model: 5076
Lead Channel Impedance Value: 380 Ohm
Lead Channel Impedance Value: 437 Ohm
Lead Channel Impedance Value: 513 Ohm
Lead Channel Pacing Threshold Amplitude: 1 V
Lead Channel Pacing Threshold Pulse Width: 0.4 ms
Lead Channel Pacing Threshold Pulse Width: 0.4 ms
Lead Channel Sensing Intrinsic Amplitude: 18.625 mV
Lead Channel Sensing Intrinsic Amplitude: 2.25 mV
Lead Channel Setting Pacing Pulse Width: 0.4 ms
MDC IDC LEAD IMPLANT DT: 20151014
MDC IDC MSMT BATTERY REMAINING LONGEVITY: 97 mo
MDC IDC MSMT BATTERY VOLTAGE: 3.01 V
MDC IDC MSMT LEADCHNL RA PACING THRESHOLD AMPLITUDE: 0.5 V
MDC IDC MSMT LEADCHNL RA SENSING INTR AMPL: 2.25 mV
MDC IDC MSMT LEADCHNL RV IMPEDANCE VALUE: 665 Ohm
MDC IDC MSMT LEADCHNL RV SENSING INTR AMPL: 18.625 mV
MDC IDC SESS DTM: 20170508142937
MDC IDC SET LEADCHNL RA PACING AMPLITUDE: 2 V
MDC IDC SET LEADCHNL RV PACING AMPLITUDE: 2.5 V
MDC IDC SET LEADCHNL RV SENSING SENSITIVITY: 2 mV
MDC IDC STAT BRADY RV PERCENT PACED: 1.11 %

## 2016-01-10 ENCOUNTER — Encounter: Payer: Self-pay | Admitting: Cardiology

## 2016-01-18 ENCOUNTER — Encounter (HOSPITAL_COMMUNITY): Payer: Self-pay

## 2016-01-18 ENCOUNTER — Encounter (HOSPITAL_COMMUNITY)
Admission: RE | Admit: 2016-01-18 | Discharge: 2016-01-18 | Disposition: A | Discharge status: Home / Self Care | Payer: Medicare Other | Source: Ambulatory Visit | Attending: Surgery | Admitting: Surgery

## 2016-01-18 DIAGNOSIS — Z950 Presence of cardiac pacemaker: Secondary | ICD-10-CM | POA: Insufficient documentation

## 2016-01-18 DIAGNOSIS — Z01812 Encounter for preprocedural laboratory examination: Secondary | ICD-10-CM | POA: Insufficient documentation

## 2016-01-18 DIAGNOSIS — Z7982 Long term (current) use of aspirin: Secondary | ICD-10-CM | POA: Diagnosis not present

## 2016-01-18 DIAGNOSIS — Z79899 Other long term (current) drug therapy: Secondary | ICD-10-CM | POA: Insufficient documentation

## 2016-01-18 DIAGNOSIS — E039 Hypothyroidism, unspecified: Secondary | ICD-10-CM | POA: Diagnosis not present

## 2016-01-18 DIAGNOSIS — I129 Hypertensive chronic kidney disease with stage 1 through stage 4 chronic kidney disease, or unspecified chronic kidney disease: Secondary | ICD-10-CM | POA: Insufficient documentation

## 2016-01-18 DIAGNOSIS — N183 Chronic kidney disease, stage 3 (moderate): Secondary | ICD-10-CM | POA: Diagnosis not present

## 2016-01-18 DIAGNOSIS — Z01818 Encounter for other preprocedural examination: Secondary | ICD-10-CM | POA: Diagnosis not present

## 2016-01-18 DIAGNOSIS — E042 Nontoxic multinodular goiter: Secondary | ICD-10-CM | POA: Diagnosis not present

## 2016-01-18 LAB — BASIC METABOLIC PANEL
ANION GAP: 6 (ref 5–15)
BUN: 19 mg/dL (ref 6–20)
CALCIUM: 8.6 mg/dL — AB (ref 8.9–10.3)
CO2: 25 mmol/L (ref 22–32)
CREATININE: 1.03 mg/dL — AB (ref 0.44–1.00)
Chloride: 106 mmol/L (ref 101–111)
GFR, EST AFRICAN AMERICAN: 57 mL/min — AB (ref >60)
GFR, EST NON AFRICAN AMERICAN: 49 mL/min — AB (ref >60)
Glucose, Bld: 77 mg/dL (ref 65–99)
Potassium: 3.9 mmol/L (ref 3.5–5.1)
Sodium: 137 mmol/L (ref 135–145)

## 2016-01-18 LAB — CBC
HEMATOCRIT: 35.7 % — AB (ref 36.0–46.0)
Hemoglobin: 11.6 g/dL — ABNORMAL LOW (ref 12.0–15.0)
MCH: 29.4 pg (ref 26.0–34.0)
MCHC: 32.5 g/dL (ref 30.0–36.0)
MCV: 90.6 fL (ref 78.0–100.0)
PLATELETS: 167 10*3/uL (ref 150–400)
RBC: 3.94 MIL/uL (ref 3.87–5.11)
RDW: 12.8 % (ref 11.5–15.5)
WBC: 4.8 10*3/uL (ref 4.0–10.5)

## 2016-01-18 NOTE — Pre-Procedure Instructions (Signed)
    Zonda Sundin T[place]way  01/18/2016      [id number] [place] [address] Phone: [phone] [phone]:[phone]    Your procedure is scheduled on Friday June 30th  Report to [facility] Admitting at 8:00 am  Call this number if you have problems the morning of surgery:  [phone]   Remember:  Do not eat food or drink liquids after midnight.  Take these medicines the morning of surgery with A SIP OF WATER: flonase nasal spray if needed, synthroid (levothyroxine), claritin if needed, Metoprolol succinate (toprol XL) Stop taking aspirin as instructed by your doctor.   Do not wear jewelry, make-up or nail polish.  Do not wear lotions, powders, or perfumes.  You may not wear deoderant.  Do not shave 48 hours prior to surgery.   Do not bring valuables to the hospital.  [facility] is not responsible for any belongings or valuables.  Contacts, dentures or bridgework may not be worn into surgery.  Leave your suitcase in the car.  After surgery it may be brought to your room.  For patients admitted to the hospital, discharge time will be determined by your treatment team.  Special instructions: Shower with CHG the night before and morning of surgery as instructed  Please read over the following fact sheets that you were given. Coughing and Deep Breathing and Surgical Site Infection Prevention, shower instructions

## 2016-01-18 NOTE — Progress Notes (Signed)
Anesthesia Chart Review: Patient is a 82 year old female scheduled for total thyroidectomy on 01/26/16 by Dr. Harlow Asa. DX: Thyroid neoplasm of uncertain behavior, bilateral thyroid nodules. (Atypia of FNA)  History includes non-smoker, HTN, palpitations/arrhythmia (PSVT, PVCs, WCT, tachy-brady syndrome), s.p Medtronic PPM 05/11/14, murmur, cardiomyopathy (widely patent coronaries 06/2013), hypothyroidism, CKD stage III, cholecystectomy.  PCP is Dr. Lajean Manes. EP cardiologist is Dr. Virl Axe, last visit 06/02/15 with no changes make in PPM programming.  Meds include ASA 81 mg (hold pre-op), Flonase, HCTZ, loratadine, Toprol XL.   06/02/15 EKG: A-paced pacemaker, non-specific T wave abnormality.  08/12/14 Echo: Study Conclusions - Left ventricle: The cavity size was normal. Wall thickness was normal. Systolic function was normal. The estimated ejection fraction was in the range of 55% to 60%. Images were inadequate for LV wall motion assessment. Doppler parameters are consistent with abnormal left ventricular relaxation (grade 1 diastolic dysfunction). The E/e&' ratio is <8, suggesting normal LV filling pressure. - Aortic valve: Trileaflet. There was trivial regurgitation. - Mitral valve: Mildly thickened leaflets . There was mild regurgitation. - Left atrium: The atrium was normal in size. - Right ventricle: The cavity size was normal. Wall thickness was normal. Pacer wire or catheter noted in right ventricle. Systolic function was normal. - Right atrium: The atrium was normal in size. Pacer wire or catheter noted in right atrium. - Tricuspid valve: There was mild to moderate regurgitation. - Pulmonary arteries: PA peak pressure: 27 mm Hg (S). Impressions: - Compared to the prior echo in 04/2014, the EF has normalized.  07/01/13 LHC (intermediate risk stress test 05/07/13): Final Conclusions:  1. Widely patent coronary arteries 2. Low normal LV function with  normal LVEDP. LVEF 50-55%.  08/14/15 Carotid U/S: IMPRESSION: - Minor carotid atherosclerosis. No hemodynamically significant ICA stenosis by ultrasound. Degree of narrowing less than 50% bilaterally. - 15 mm right midpole thyroid nodule. Recommend follow-up nonemergent thyroid ultrasound for complete evaluation.  Preoperative labs noted. Cr 1.03. H/H 11.6/35.7.  She will need a CXR on the day of surgery per anesthesia criteria (for thyroidectomies). PAT RN notified Tomi Bamberger with [employer] of surgery plans.  [facility]/Anesthesiology Phone [phone] 01/18/2016 4:40 PM

## 2016-01-18 NOTE — Progress Notes (Signed)
Cardiologist Dr Fransico Him but has been followed most recently by Dr Caryl Comes, who follows her pacemaker.  Medtronic pacemaker; programming order form faxed; Tomi Bamberger notified of surgery date, time and procedure.  PCP Dr Lajean Manes with [facility] [place]. Last OV note requested  EKG 06/02/15 in EPIC.

## 2016-01-19 ENCOUNTER — Other Ambulatory Visit (HOSPITAL_COMMUNITY): Payer: Self-pay | Admitting: *Deleted

## 2016-01-24 ENCOUNTER — Encounter (HOSPITAL_COMMUNITY): Payer: Self-pay | Admitting: Surgery

## 2016-01-24 DIAGNOSIS — D440 Neoplasm of uncertain behavior of thyroid gland: Secondary | ICD-10-CM | POA: Diagnosis present

## 2016-01-24 NOTE — H&P (Signed)
General Surgery [facility]  Adler A. Howry DOB: 01-Dec-1931 Married / Language: English / Race: White Female  History of Present Illness  The patient is a 82 year old female who presents with a thyroid nodule.  Patient is referred by Dr. Lajean Manes for right thyroid nodule with cytologic atypia. Patient was incidentally found to have thyroid nodules while undergoing carotid duplex scanning. Subsequent thyroid ultrasound on August 23, 2015 shows a dominant nodule in the upper pole of the right thyroid lobe measuring 15 x 9 x 11 mm. A second nodule was noted in the mid left thyroid lobe measuring 7 mm. Thyroid tissue was heterogeneous. Subsequent fine-needle aspiration biopsy was performed on August 31, 2015. This shows a suspicious nodule, Bethesda category 4, consistent with a Hurthle cell lesion or neoplasm. Patient does have a history of hypothyroidism and takes Synthroid 50 g daily. She has had no prior head or neck surgery. She has been on Synthroid for a number of years. There is no immediate family history of thyroid disease or thyroid cancer. Patient denies tremors or palpitations. She has no pain or compressive symptoms.  Other Problems Back Pain  Past Surgical History Cataract Surgery Left. Tonsillectomy  Diagnostic Studies History  Mammogram 1-3 years ago Pap Smear 1-5 years ago  Allergies  Lisinopril *ANTIHYPERTENSIVES* Simvastatin *ANTIHYPERLIPIDEMICS* Welchol *ANTIHYPERLIPIDEMICS* Metoprolol Succinate ER *BETA BLOCKERS* Amiodarone HCl *ANTIARRHYTHMICS* Zetia *ANTIHYPERLIPIDEMICS*  Medication History HydroCHLOROthiazide (25MG  Tablet, Oral) Active. Levothyroxine Sodium (50MCG Tablet, Oral) Active. Losartan Potassium (25MG  Tablet, Oral) Active. Metoprolol Succinate ER (50MG  Tablet ER 24HR, Oral) Active. Claritin (10MG  Capsule, Oral) Active. Aspirin (81MG  Tablet Chewable, Oral) Active. Vitamin B12 (1000MCG  Tablet ER, Oral) Active. Medications Reconciled  Social History  No alcohol use Tobacco use Never smoker.  Family History Heart Disease Father.  Pregnancy / Birth History Age at menarche 60 years. Gravida 2 Maternal age 36-40 Para 2 Regular periods  Review of Systems General Present- Chills. Not Present- Appetite Loss, Fatigue, Fever, Night Sweats, Weight Gain and Weight Loss. Musculoskeletal Present- Back Pain and Joint Pain. Not Present- Joint Stiffness, Muscle Pain, Muscle Weakness and Swelling of Extremities. Psychiatric Not Present- Anxiety, Bipolar, Change in Sleep Pattern, Depression, Fearful and Frequent crying. Endocrine Present- Hair Changes. Not Present- Cold Intolerance, Excessive Hunger, Heat Intolerance, Hot flashes and New Diabetes. Hematology Not Present- Easy Bruising, Excessive bleeding, Gland problems, HIV and Persistent Infections.  Vitals Weight: 128 lb Height: 64.5in Body Surface Area: 1.63 m Body Mass Index: 21.63 kg/m  Temp.: 97.40F  Pulse: 74 (Regular)  BP: 134/84 (Sitting, Left Arm, Standard)  Physical Exam  General - appears comfortable, no distress; not diaphorectic  HEENT - normocephalic; sclerae clear, gaze conjugate; mucous membranes moist, dentition good; voice normal  Neck - symmetric on extension; no palpable anterior or posterior cervical adenopathy; in the upper right thyroid lobe is a soft nodule measuring less than 2 cm in size, mobile, nontender; no palpable nodularity in the left thyroid lobe  Chest - clear bilaterally without rhonchi, rales, or wheeze; pacemaker left upper chest wall  Cor - regular rhythm with normal rate; no significant murmur  Ext - non-tender without significant edema or lymphedema  Neuro - grossly intact; no tremor   Assessment & Plan  NEOPLASM OF UNCERTAIN BEHAVIOR OF THYROID GLAND (D44.0)  Patient presents with bilateral thyroid nodules. Fine-needle aspiration of the dominant  right thyroid nodule shows cytologic atypia. After discussion, I have recommended proceeding with repeat biopsy of the dominant right thyroid nodule and  submitting the sample for [facility] testing. This is a molecular genetic test which will stratify the risk for this nodule into either a low risk(5%) or a high-risk (40%) chance of malignancy. If the patient is in the low risk group, then I think we can follow her with sequential ultrasound scanning and physical examination. If the patient is in the high-risk group, then I would recommend total thyroidectomy.  ADDENDUM: Marijean Niemann is "suspicious" for malignancy.  I have recommended proceeding with total thyroidectomy.  The risks and benefits of the procedure have been discussed at length with the patient.  The patient understands the proposed procedure, potential alternative treatments, and the course of recovery to be expected.  All of the patient's questions have been answered at this time.  The patient wishes to proceed with surgery.  Earnstine Regal, MD, [facility] Office: [phone]

## 2016-01-25 MED ORDER — CEFAZOLIN SODIUM-DEXTROSE 2-4 GM/100ML-% IV SOLN
2.0000 g | INTRAVENOUS | Status: AC
  Administered 2016-01-26: 2 g via INTRAVENOUS
  Filled 2016-01-25: qty 100

## 2016-01-26 ENCOUNTER — Observation Stay (HOSPITAL_COMMUNITY)
Admission: RE | Admit: 2016-01-26 | Discharge: 2016-01-28 | Disposition: A | Discharge status: Home / Self Care | Payer: Medicare Other | Source: Ambulatory Visit | Attending: Surgery | Admitting: Surgery

## 2016-01-26 ENCOUNTER — Ambulatory Visit (HOSPITAL_COMMUNITY): Payer: Medicare Other

## 2016-01-26 ENCOUNTER — Encounter (HOSPITAL_COMMUNITY)
Admission: RE | Disposition: A | Discharge status: Home / Self Care | Payer: Self-pay | Source: Ambulatory Visit | Attending: Surgery

## 2016-01-26 ENCOUNTER — Ambulatory Visit (HOSPITAL_COMMUNITY): Payer: Medicare Other | Admitting: Vascular Surgery

## 2016-01-26 ENCOUNTER — Encounter (HOSPITAL_COMMUNITY): Payer: Self-pay | Admitting: General Practice

## 2016-01-26 ENCOUNTER — Ambulatory Visit (HOSPITAL_COMMUNITY): Payer: Medicare Other | Admitting: Certified Registered Nurse Anesthetist

## 2016-01-26 DIAGNOSIS — I493 Ventricular premature depolarization: Secondary | ICD-10-CM | POA: Insufficient documentation

## 2016-01-26 DIAGNOSIS — D440 Neoplasm of uncertain behavior of thyroid gland: Secondary | ICD-10-CM | POA: Diagnosis not present

## 2016-01-26 DIAGNOSIS — Z950 Presence of cardiac pacemaker: Secondary | ICD-10-CM | POA: Diagnosis not present

## 2016-01-26 DIAGNOSIS — D34 Benign neoplasm of thyroid gland: Secondary | ICD-10-CM | POA: Diagnosis not present

## 2016-01-26 DIAGNOSIS — Z01818 Encounter for other preprocedural examination: Secondary | ICD-10-CM

## 2016-01-26 DIAGNOSIS — I469 Cardiac arrest, cause unspecified: Secondary | ICD-10-CM | POA: Insufficient documentation

## 2016-01-26 DIAGNOSIS — I129 Hypertensive chronic kidney disease with stage 1 through stage 4 chronic kidney disease, or unspecified chronic kidney disease: Secondary | ICD-10-CM | POA: Insufficient documentation

## 2016-01-26 DIAGNOSIS — I495 Sick sinus syndrome: Secondary | ICD-10-CM | POA: Insufficient documentation

## 2016-01-26 DIAGNOSIS — I2510 Atherosclerotic heart disease of native coronary artery without angina pectoris: Secondary | ICD-10-CM | POA: Diagnosis not present

## 2016-01-26 DIAGNOSIS — E063 Autoimmune thyroiditis: Secondary | ICD-10-CM | POA: Diagnosis not present

## 2016-01-26 DIAGNOSIS — N183 Chronic kidney disease, stage 3 (moderate): Secondary | ICD-10-CM | POA: Diagnosis not present

## 2016-01-26 HISTORY — PX: THYROIDECTOMY: SHX17

## 2016-01-26 SURGERY — THYROIDECTOMY
Anesthesia: General | Site: Neck

## 2016-01-26 MED ORDER — HYDROCODONE-ACETAMINOPHEN 5-325 MG PO TABS
1.0000 | ORAL_TABLET | ORAL | Status: DC | PRN
  Administered 2016-01-26: 2 via ORAL
  Administered 2016-01-27 (×2): 1 via ORAL
  Filled 2016-01-26 (×2): qty 1

## 2016-01-26 MED ORDER — ONDANSETRON HCL 4 MG/2ML IJ SOLN
INTRAMUSCULAR | Status: DC | PRN
  Administered 2016-01-26: 4 mg via INTRAVENOUS

## 2016-01-26 MED ORDER — CALCIUM CARBONATE 1250 (500 CA) MG PO TABS
2.0000 | ORAL_TABLET | Freq: Three times a day (TID) | ORAL | Status: DC
Start: 2016-01-26 — End: 2016-01-27
  Administered 2016-01-26: 1000 mg via ORAL
  Administered 2016-01-27: 500 mg via ORAL
  Filled 2016-01-26 (×2): qty 1

## 2016-01-26 MED ORDER — HYDROMORPHONE HCL 1 MG/ML IJ SOLN
INTRAMUSCULAR | Status: AC
  Filled 2016-01-26: qty 1

## 2016-01-26 MED ORDER — FENTANYL CITRATE (PF) 100 MCG/2ML IJ SOLN
INTRAMUSCULAR | Status: DC | PRN
  Administered 2016-01-26 (×5): 50 ug via INTRAVENOUS

## 2016-01-26 MED ORDER — ROCURONIUM BROMIDE 100 MG/10ML IV SOLN
INTRAVENOUS | Status: DC | PRN
  Administered 2016-01-26: 10 mg via INTRAVENOUS
  Administered 2016-01-26: 30 mg via INTRAVENOUS
  Administered 2016-01-26: 10 mg via INTRAVENOUS

## 2016-01-26 MED ORDER — ONDANSETRON 4 MG PO TBDP: 4.0000 mg | ORAL_TABLET | Freq: Four times a day (QID) | ORAL | Status: DC | PRN

## 2016-01-26 MED ORDER — METOPROLOL SUCCINATE ER 25 MG PO TB24
25.0000 mg | ORAL_TABLET | Freq: Every day | ORAL | Status: DC
  Administered 2016-01-27 – 2016-01-28 (×2): 25 mg via ORAL
  Filled 2016-01-26 (×2): qty 1

## 2016-01-26 MED ORDER — LIDOCAINE 2% (20 MG/ML) 5 ML SYRINGE
INTRAMUSCULAR | Status: DC | PRN
  Administered 2016-01-26: 100 mg via INTRAVENOUS

## 2016-01-26 MED ORDER — LIDOCAINE HCL (CARDIAC) 20 MG/ML IV SOLN
100.0000 mg | Freq: Once | INTRAVENOUS | Status: AC
  Administered 2016-01-26: 100 mg via INTRAVENOUS

## 2016-01-26 MED ORDER — MIDAZOLAM HCL 2 MG/2ML IJ SOLN
INTRAMUSCULAR | Status: AC
  Filled 2016-01-26: qty 2

## 2016-01-26 MED ORDER — ACETAMINOPHEN 650 MG RE SUPP: 650.0000 mg | Freq: Four times a day (QID) | RECTAL | Status: DC | PRN

## 2016-01-26 MED ORDER — HYDROMORPHONE HCL 1 MG/ML IJ SOLN
1.0000 mg | INTRAMUSCULAR | Status: DC | PRN
  Administered 2016-01-26 – 2016-01-27 (×3): 1 mg via INTRAVENOUS
  Filled 2016-01-26 (×4): qty 1

## 2016-01-26 MED ORDER — KETOROLAC TROMETHAMINE 30 MG/ML IJ SOLN
30.0000 mg | Freq: Once | INTRAMUSCULAR | Status: AC
  Administered 2016-01-26: 30 mg via INTRAVENOUS

## 2016-01-26 MED ORDER — PROPOFOL 10 MG/ML IV BOLUS
INTRAVENOUS | Status: DC | PRN
  Administered 2016-01-26: 120 mg via INTRAVENOUS

## 2016-01-26 MED ORDER — SUGAMMADEX SODIUM 200 MG/2ML IV SOLN
INTRAVENOUS | Status: DC | PRN
  Administered 2016-01-26: 120 mg via INTRAVENOUS

## 2016-01-26 MED ORDER — LEVOTHYROXINE SODIUM 50 MCG PO TABS
50.0000 ug | ORAL_TABLET | Freq: Every day | ORAL | Status: DC
  Administered 2016-01-27 – 2016-01-28 (×2): 50 ug via ORAL
  Filled 2016-01-26 (×2): qty 1

## 2016-01-26 MED ORDER — KETOROLAC TROMETHAMINE 30 MG/ML IJ SOLN
INTRAMUSCULAR | Status: AC
  Filled 2016-01-26: qty 1

## 2016-01-26 MED ORDER — MIDAZOLAM HCL 5 MG/5ML IJ SOLN
INTRAMUSCULAR | Status: DC | PRN
  Administered 2016-01-26: 0.5 mg via INTRAVENOUS

## 2016-01-26 MED ORDER — HYDROCODONE-ACETAMINOPHEN 5-325 MG PO TABS
ORAL_TABLET | ORAL | Status: AC
  Administered 2016-01-26: 2 via ORAL
  Filled 2016-01-26: qty 2

## 2016-01-26 MED ORDER — EPHEDRINE SULFATE-NACL 50-0.9 MG/10ML-% IV SOSY
PREFILLED_SYRINGE | INTRAVENOUS | Status: DC | PRN
  Administered 2016-01-26: 5 mg via INTRAVENOUS

## 2016-01-26 MED ORDER — HYDROMORPHONE HCL 1 MG/ML IJ SOLN
0.2500 mg | INTRAMUSCULAR | Status: DC | PRN
  Administered 2016-01-26 (×2): 0.5 mg via INTRAVENOUS

## 2016-01-26 MED ORDER — ONDANSETRON HCL 4 MG/2ML IJ SOLN
4.0000 mg | Freq: Four times a day (QID) | INTRAMUSCULAR | Status: DC | PRN
  Administered 2016-01-26 – 2016-01-27 (×2): 4 mg via INTRAVENOUS
  Filled 2016-01-26 (×2): qty 2

## 2016-01-26 MED ORDER — PROPOFOL 10 MG/ML IV BOLUS
INTRAVENOUS | Status: AC
  Filled 2016-01-26: qty 20

## 2016-01-26 MED ORDER — ACETAMINOPHEN 325 MG PO TABS: 650.0000 mg | ORAL_TABLET | Freq: Four times a day (QID) | ORAL | Status: DC | PRN

## 2016-01-26 MED ORDER — ESMOLOL HCL 100 MG/10ML IV SOLN
INTRAVENOUS | Status: DC | PRN
  Administered 2016-01-26: 30 mg via INTRAVENOUS
  Administered 2016-01-26: 10 mg via INTRAVENOUS
  Administered 2016-01-26: 20 mg via INTRAVENOUS

## 2016-01-26 MED ORDER — HYDROMORPHONE HCL 1 MG/ML IJ SOLN
INTRAMUSCULAR | Status: AC
  Administered 2016-01-26: 0.5 mg via INTRAVENOUS
  Filled 2016-01-26: qty 1

## 2016-01-26 MED ORDER — LIDOCAINE HCL (CARDIAC) 20 MG/ML IV SOLN
INTRAVENOUS | Status: AC
  Filled 2016-01-26: qty 5

## 2016-01-26 MED ORDER — MEPERIDINE HCL 25 MG/ML IJ SOLN: 6.2500 mg | INTRAMUSCULAR | Status: DC | PRN

## 2016-01-26 MED ORDER — HEMOSTATIC AGENTS (NO CHARGE) OPTIME
TOPICAL | Status: DC | PRN
  Administered 2016-01-26: 1 via TOPICAL

## 2016-01-26 MED ORDER — KCL IN DEXTROSE-NACL 20-5-0.45 MEQ/L-%-% IV SOLN
INTRAVENOUS | Status: DC
  Administered 2016-01-26: 18:00:00 via INTRAVENOUS
  Administered 2016-01-28: 1 mL via INTRAVENOUS
  Filled 2016-01-26 (×3): qty 1000

## 2016-01-26 MED ORDER — 0.9 % SODIUM CHLORIDE (POUR BTL) OPTIME
TOPICAL | Status: DC | PRN
  Administered 2016-01-26: 1000 mL

## 2016-01-26 MED ORDER — PROMETHAZINE HCL 25 MG/ML IJ SOLN: 6.2500 mg | INTRAMUSCULAR | Status: DC | PRN

## 2016-01-26 MED ORDER — LOSARTAN POTASSIUM 50 MG PO TABS
25.0000 mg | ORAL_TABLET | Freq: Every day | ORAL | Status: DC
  Administered 2016-01-26 – 2016-01-28 (×3): 25 mg via ORAL
  Filled 2016-01-26 (×3): qty 1

## 2016-01-26 MED ORDER — FENTANYL CITRATE (PF) 250 MCG/5ML IJ SOLN
INTRAMUSCULAR | Status: AC
  Filled 2016-01-26: qty 5

## 2016-01-26 MED ORDER — ATROPINE SULFATE 1 MG/10ML IJ SOSY
PREFILLED_SYRINGE | INTRAMUSCULAR | Status: AC
  Filled 2016-01-26: qty 10

## 2016-01-26 MED ORDER — HYDROCHLOROTHIAZIDE 25 MG PO TABS
12.5000 mg | ORAL_TABLET | Freq: Every day | ORAL | Status: DC
Start: 2016-01-26 — End: 2016-01-28
  Administered 2016-01-26 – 2016-01-28 (×3): 12.5 mg via ORAL
  Filled 2016-01-26 (×3): qty 1

## 2016-01-26 MED ORDER — LACTATED RINGERS IV SOLN
INTRAVENOUS | Status: DC
  Administered 2016-01-26 (×2): via INTRAVENOUS

## 2016-01-26 MED ORDER — PHENYLEPHRINE HCL 10 MG/ML IJ SOLN
10.0000 mg | INTRAVENOUS | Status: DC | PRN
  Administered 2016-01-26: 50 ug/min via INTRAVENOUS

## 2016-01-26 SURGICAL SUPPLY — 57 items
APL SKNCLS STERI-STRIP NONHPOA (GAUZE/BANDAGES/DRESSINGS) ×1
ATTRACTOMAT 16X20 MAGNETIC DRP (DRAPES) ×3 IMPLANT
BENZOIN TINCTURE PRP APPL 2/3 (GAUZE/BANDAGES/DRESSINGS) ×3 IMPLANT
BLADE SURG 10 STRL SS (BLADE) ×3 IMPLANT
BLADE SURG 15 STRL LF DISP TIS (BLADE) ×1 IMPLANT
BLADE SURG 15 STRL SS (BLADE) ×3
BLADE SURG ROTATE 9660 (MISCELLANEOUS) IMPLANT
CANISTER SUCTION 2500CC (MISCELLANEOUS) ×3 IMPLANT
CHLORAPREP W/TINT 10.5 ML (MISCELLANEOUS) ×3 IMPLANT
CLIP TI MEDIUM 24 (CLIP) ×3 IMPLANT
CLIP TI WIDE RED SMALL 24 (CLIP) ×3 IMPLANT
CLOSURE STERI-STRIP 1/2X4 (GAUZE/BANDAGES/DRESSINGS) ×1
CLOSURE WOUND 1/2 X4 (GAUZE/BANDAGES/DRESSINGS) ×1
CLSR STERI-STRIP ANTIMIC 1/2X4 (GAUZE/BANDAGES/DRESSINGS) ×1 IMPLANT
CONT SPEC 4OZ CLIKSEAL STRL BL (MISCELLANEOUS) ×2 IMPLANT
COVER SURGICAL LIGHT HANDLE (MISCELLANEOUS) ×3 IMPLANT
CRADLE DONUT ADULT HEAD (MISCELLANEOUS) ×3 IMPLANT
DRAPE LAPAROTOMY 100X72 PEDS (DRAPES) ×3 IMPLANT
DRAPE UTILITY XL STRL (DRAPES) ×3 IMPLANT
ELECT CAUTERY BLADE 6.4 (BLADE) ×3 IMPLANT
ELECT REM PT RETURN 9FT ADLT (ELECTROSURGICAL) ×3
ELECTRODE REM PT RTRN 9FT ADLT (ELECTROSURGICAL) ×1 IMPLANT
GAUZE SPONGE 4X4 12PLY STRL (GAUZE/BANDAGES/DRESSINGS) ×3 IMPLANT
GAUZE SPONGE 4X4 16PLY XRAY LF (GAUZE/BANDAGES/DRESSINGS) ×5 IMPLANT
GLOVE BIO SURGEON STRL SZ 6.5 (GLOVE) ×1 IMPLANT
GLOVE BIO SURGEONS STRL SZ 6.5 (GLOVE) ×1
GLOVE BIOGEL PI IND STRL 7.0 (GLOVE) IMPLANT
GLOVE BIOGEL PI INDICATOR 7.0 (GLOVE) ×4
GLOVE SURG ORTHO 8.0 STRL STRW (GLOVE) ×3 IMPLANT
GOWN STRL REUS W/ TWL LRG LVL3 (GOWN DISPOSABLE) ×2 IMPLANT
GOWN STRL REUS W/ TWL XL LVL3 (GOWN DISPOSABLE) ×1 IMPLANT
GOWN STRL REUS W/TWL LRG LVL3 (GOWN DISPOSABLE) ×6
GOWN STRL REUS W/TWL XL LVL3 (GOWN DISPOSABLE) ×3
HEMOSTAT SURGICEL 2X4 FIBR (HEMOSTASIS) ×3 IMPLANT
ILLUMINATOR WAVEGUIDE N/F (MISCELLANEOUS) ×2 IMPLANT
KIT BASIN OR (CUSTOM PROCEDURE TRAY) ×3 IMPLANT
KIT ROOM TURNOVER OR (KITS) ×3 IMPLANT
LIGHT WAVEGUIDE WIDE FLAT (MISCELLANEOUS) IMPLANT
NS IRRIG 1000ML POUR BTL (IV SOLUTION) ×3 IMPLANT
PACK SURGICAL SETUP 50X90 (CUSTOM PROCEDURE TRAY) ×3 IMPLANT
PAD ARMBOARD 7.5X6 YLW CONV (MISCELLANEOUS) ×3 IMPLANT
PENCIL BUTTON HOLSTER BLD 10FT (ELECTRODE) ×3 IMPLANT
SHEARS HARMONIC 9CM CVD (BLADE) ×3 IMPLANT
SPECIMEN JAR MEDIUM (MISCELLANEOUS) IMPLANT
SPONGE GAUZE 4X4 12PLY STER LF (GAUZE/BANDAGES/DRESSINGS) ×2 IMPLANT
SPONGE INTESTINAL PEANUT (DISPOSABLE) ×1 IMPLANT
STRIP CLOSURE SKIN 1/2X4 (GAUZE/BANDAGES/DRESSINGS) ×2 IMPLANT
SUT MNCRL AB 4-0 PS2 18 (SUTURE) ×3 IMPLANT
SUT SILK 2 0 (SUTURE) ×3
SUT SILK 2-0 18XBRD TIE 12 (SUTURE) ×1 IMPLANT
SUT VIC AB 3-0 SH 18 (SUTURE) ×3 IMPLANT
SYR BULB 3OZ (MISCELLANEOUS) ×3 IMPLANT
TAPE CLOTH SURG 6X10 WHT LF (GAUZE/BANDAGES/DRESSINGS) ×2 IMPLANT
TOWEL OR 17X24 6PK STRL BLUE (TOWEL DISPOSABLE) ×1 IMPLANT
TOWEL OR 17X26 10 PK STRL BLUE (TOWEL DISPOSABLE) ×3 IMPLANT
TUBE CONNECTING 12'X1/4 (SUCTIONS) ×1
TUBE CONNECTING 12X1/4 (SUCTIONS) ×2 IMPLANT

## 2016-01-26 NOTE — Anesthesia Preprocedure Evaluation (Signed)
Anesthesia Evaluation  Patient identified by MRN, date of birth, ID band Patient awake    Reviewed: Allergy & Precautions, H&P , NPO status , Patient's Chart, lab work & pertinent test results, reviewed documented beta blocker date and time   Airway Mallampati: I  TM Distance: >3 FB Neck ROM: full    Dental no notable dental hx.    Pulmonary neg pulmonary ROS,    Pulmonary exam normal breath sounds clear to auscultation       Cardiovascular Exercise Tolerance: Good hypertension, Pt. on medications and Pt. on home beta blockers Normal cardiovascular exam+ pacemaker   Remotely interrogated three weeks ago. No problems.   Neuro/Psych negative neurological ROS  negative psych ROS   GI/Hepatic negative GI ROS, Neg liver ROS,   Endo/Other    Renal/GU   negative genitourinary   Musculoskeletal   Abdominal Normal abdominal exam  (+)   Peds  Hematology negative hematology ROS (+)   Anesthesia Other Findings   Reproductive/Obstetrics negative OB ROS                             Anesthesia Physical Anesthesia Plan  ASA: II  Anesthesia Plan: General   Post-op Pain Management:    Induction: Intravenous  Airway Management Planned: Oral ETT  Additional Equipment:   Intra-op Plan:   Post-operative Plan: Extubation in OR  Informed Consent: I have reviewed the patients History and Physical, chart, labs and discussed the procedure including the risks, benefits and alternatives for the proposed anesthesia with the patient or authorized representative who has indicated his/her understanding and acceptance.   Dental Advisory Given  Plan Discussed with: CRNA and Surgeon  Anesthesia Plan Comments:         Anesthesia Quick Evaluation

## 2016-01-26 NOTE — Interval H&P Note (Signed)
History and Physical Interval Note:  01/26/2016 9:32 AM  Michelle Winters  has presented today for surgery, with the diagnosis of Thyroid neoplasm of uncertain behavior, bilateral thyroid nodules.   The various methods of treatment have been discussed with the patient and family. After consideration of risks, benefits and other options for treatment, the patient has consented to    Procedure(s): TOTAL THYROIDECTOMY ([name]) as a surgical intervention .    The patient's history has been reviewed, patient examined, no change in status, stable for surgery.  I have reviewed the patient's chart and labs.  Questions were answered to the patient's satisfaction.    Earnstine Regal, MD, [facility] Office: [place]

## 2016-01-26 NOTE — Anesthesia Procedure Notes (Signed)
Procedure Name: Intubation Date/Time: 01/26/2016 10:00 AM Performed by: Bethel Born Pre-anesthesia Checklist: Patient identified, Emergency Drugs available, Suction available and Patient being monitored Patient Re-evaluated:Patient Re-evaluated prior to inductionOxygen Delivery Method: Circle system utilized Preoxygenation: Pre-oxygenation with 100% oxygen Intubation Type: IV induction Ventilation: Mask ventilation without difficulty Laryngoscope Size: Miller and 2 Grade View: Grade I Tube type: Oral Tube size: 7.5 mm Number of attempts: 1 Airway Equipment and Method: Stylet Placement Confirmation: ETT inserted through vocal cords under direct vision,  positive ETCO2 and breath sounds checked- equal and bilateral Secured at: 22 cm Tube secured with: Tape Dental Injury: Teeth and Oropharynx as per pre-operative assessment

## 2016-01-26 NOTE — Progress Notes (Signed)
General Surgery [facility]  Called to PACU for arrhythmia, limited resuscitation by anesthesia.  Discussed with Dr. Gaynelle Cage at bedside.  Cardiology consult called to Dr. Ellouise Newer.  Telemetry bed requested.  tmg  Earnstine Regal, MD, [facility] Office: [phone]

## 2016-01-26 NOTE — Consult Note (Signed)
ELECTROPHYSIOLOGY CONSULT NOTE   Patient ID: Michelle Winters MRN: [id number], DOB/AGE: [birth year]   Admit date: 01/26/2016 Date of Consult: 01/26/2016   Primary Physician: Mathews Argyle, MD Primary Cardiologist: Dr. Caryl Comes  Pt. Profile  82 yo female with PMH of HTN, h/o SVT, tachy-brady s/p dual chamber Medtronic PPM, WCT, and stage III CKD presented with thyroid surgery. Post surgery she had bigeminy and received lidocaine and subsequently lost consciousness requiring brief CPR.   Problem List  Past Medical History  Diagnosis Date  . Heart palpitations     a. paroxysmal SVT, PVCs, NSVT. b. echo 03/27/14: EF [id number], nl systolic function, AK of basalinferior myocardium, possible HK of the apicallateral and inferolateral myocardium, Ao trivial regurg, mild MR, mild LAE, trivial pulmonic regurg  . Hypertension   . Bradycardia   . Osteopenia 09/2015    T score -2.4 FRAX 17%/6.5%  . CAD (coronary artery disease)     a. cath 07/01/13: LM no obs dz, LAD widely patent no obs dz, LCx normal in appearance, RCA  small, nondominant vessel. Catheter-induced spasm at the ostium, EF 50-55%  . Wide-complex tachycardia ([place]) 03/25/2014  . Hypothyroidism   . Cystitis 1950"s    "tube to bladder was in a kink & I had polyps; surgically corrected"  . Pacemaker     a.  Medtronic MRI compatible ADVISA pulse generator serial number [id number].  . Arthritis     "hands, fingers, toes" (05/11/2014)  . Chronic kidney disease (CKD), stage III (moderate)   . Cardiomyopathy ([place])     a. Echo 10/15: Inferior and inferolateral AK,  Anterolateral HK, EF 40%;  b.  Echo (1/16):  EF 55-60%, Gr 1 DD, PASP 27, mild to mod TR, trivial AI, mild MR  . Thyroid nodule   . Heart murmur     "slight"    Past Surgical History  Procedure Laterality Date  . Cholecystectomy    . Dilation and curettage of uterus    . Hysteroscopy  1950's    "tube to bladder was in a kink & I had polyps; surgically corrected"  .  Tonsillectomy    . Cardiac catheterization  06/2013    normal coronary arteries  . Pacemaker insertion  05/11/2014    MDT dual chamber Advisa pacemaker implanted by Dr Caryl Comes -- MRI compatible system  . Cataract extraction w/ intraocular lens  implant, bilateral Bilateral   . Left heart catheterization with coronary angiogram [name] 07/01/2013    Procedure: LEFT HEART CATHETERIZATION WITH CORONARY ANGIOGRAM;  Surgeon: Blane Ohara, MD;  Location: [facility] CATH LAB;  Service: Cardiovascular;  Laterality: [name];  . Permanent pacemaker insertion [name] 05/11/2014    Procedure: PERMANENT PACEMAKER INSERTION;  Surgeon: Deboraha Sprang, MD;  Location: [facility] CATH LAB;  Service: Cardiovascular;  Laterality: [name];  . Thyroid nodule biopsy    . Ureter surgery Left many years ago? 1960     Allergies  Allergies  Allergen Reactions  . Lisinopril Other (See Comments)    Chest tightness  . Simvastatin Other (See Comments)    Muscle pain  . Welchol [Colesevelam Hcl] Other (See Comments)    Muscle pain   . Amiodarone Other (See Comments)    Lowered heart rate   . Ciprofloxacin Nausea Only  . Zetia [Ezetimibe] Nausea Only    HPI   Mrs. Michelle Winters is 82 yo female with PMH of HTN, h/o SVT, tachy-brady s/p dual chamber Medtronic PPM, WCT, and stage III CKD. She had a cardiac catheterization  on 07/01/2013 which showed essentially normal coronaries, catheter-induced spasm as the ostium, EF 50-55%. She underwent MRI compatible dual-chamber Medtronic pacemaker placement on 05/11/2014. Ultrasound obtained in January 2017 showed dominant nodule in the upper pole of the right thyroid lobe measuring 15 x 9 x 11 mm. There is a second nodule noted in the mid left thyroid lobe measuring 7 mm. She was found to have a thyroid nodule while undergoing carotid duplex scanning. 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   She presented for total thyroidectomy by Dr. Harlow Asa on  01/26/2016. Post procedure, while in the PACU, it was noticed the patient was in bigeminy. Patient was satting 100%, pulse was noted to be in the 30s to 40s due to the bigeminy. Patient was given 100 mg lidocaine ordered by anesthesiologist, shortly after, she had restoration of sinus rhythm. Unfortunately due to motion artifact on the strip, it was felt that the patient may have been asystolic.  Review reveals sinus rhythm.  However patient apparently was unarousable at the time. Therefore chest compression was started. However within 1 minute, she had restoration of pulse. Cardiology has been in consulted for evaluation of possible arrhythmia and questionable ventricular rhythm. Medtronic rep has been called to interrogate her device.    Inpatient Medications  . atropine      . HYDROmorphone      . ketorolac      . lidocaine (cardiac) 100 mg/28ml        Family History Family History  Problem Relation Age of Onset  . Multiple sclerosis Mother   . Heart attack Father   . Cancer Sister     Skin cancer  . Diabetes Sister   . Heart disease Brother   . Cancer Brother     Prostate  . Leukemia Maternal Grandmother   . Nephritis Paternal Grandmother      Social History Social History   Social History  . Marital Status: Married    Spouse Name: [name]  . Number of Children: [name]  . Years of Education: [name]   Occupational History  . Not on file.   Social History Main Topics  . Smoking status: Never Smoker   . Smokeless tobacco: Never Used  . Alcohol Use: No  . Drug Use: No  . Sexual Activity: No     Comment: 1st intercourse 39 yo-1 partner   Other Topics Concern  . Not on file   Social History Narrative     Review of Systems  Waking up from sedation. Complains of burning sensation in the chest after compression and headache. Denies any SOB.   Physical Exam  Blood pressure 142/85, pulse 60, temperature 97.6 F (36.4 C), resp. rate 17, height 5' 5.5" (1.664 m), weight 131 lb  (59.421 kg), SpO2 100 %.  General: NAD Psych: Normal affect. Neuro: Alert. Drowsy from sedation. Moves all extremities spontaneously. HEENT: neck incision covered with dressing.  Neck: Supple without bruits or JVD. Lungs:  Resp regular and unlabored, anterior CTA. Heart: RRR no s3, s4, or murmurs. Abdomen: Soft, non-tender, non-distended, BS + x 4.  Extremities: No clubbing, cyanosis or edema. DP/PT/Radials 2+ and equal bilaterally.  Labs  No results for input(s): CKTOTAL, CKMB, TROPONINI in the last 72 hours. Lab Results  Component Value Date   WBC 4.8 01/18/2016   HGB 11.6* 01/18/2016   HCT 35.7* 01/18/2016   MCV 90.6 01/18/2016   PLT 167 01/18/2016   No results for  input(s): NA, K, CL, CO2, BUN, CREATININE, CALCIUM, PROT, BILITOT, ALKPHOS, ALT, AST, GLUCOSE in the last 168 hours.  Invalid input(s): LABALBU No results found for: CHOL, HDL, LDLCALC, TRIG No results found for: DDIMER  Radiology/Studies  Dg Chest 2 View  01/26/2016  CLINICAL DATA:  Preop for thyroidectomy. EXAM: CHEST  2 VIEW COMPARISON:  Radiograph of May 27, 2014. FINDINGS: The heart size and mediastinal contours are within normal limits. Both lungs are clear. No pneumothorax or pleural effusion is noted. Hyperexpansion of the lungs is noted. Left-sided pacemaker is unchanged in position. The visualized skeletal structures are unremarkable. IMPRESSION: Hyperexpansion of the lungs. No acute cardiopulmonary abnormality seen. Electronically Signed   By: Marijo Conception, M.D.   On: 01/26/2016 08:42    ECG  None. Strips reviewed as above  ASSESSMENT AND PLAN  1. LOC likely due to anesthesia/ surgical recovery  - will interrogate pacemaker  - recommend admit for observation tonight on telemetry. Continue to monitor for recurrence for now. Check electrolyte. Further recommendation per MD.   2. 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 Harlow Asa  3. H/o tachy-brady s/p dual  chamber Medtronic PPM  4. HTN: vital sign stable.   5. Stage III CKD   Signed, Almyra Deforest, PA-C 01/26/2016, 1:16 PM  I have seen, examined the patient, and reviewed the above assessment and plan.  On exam, RRR. Changes to above are made where necessary.  I have reviewed PPM interrogation today (dual chamber Medtronic PPM) which is normal today.  Lower rate increased from 60 to 70 BPM.  Remains in MVP pacing mode.  She had PVCs for which she was given lidocaine.  Soon thereafter because unresponsive per report.  Review of rhythm strip during this time reveals sinus rhythm.  I do not see any sustained arrhythmias or abnormal pacemaker function. Overnight observation on telemetry is reasonable Would obtain echo.  If echo is normal, electrophysiology team to see as needed while here. Please call with questions.   Co Sign: Thompson Grayer, MD 01/26/2016 2:15 PM

## 2016-01-26 NOTE — Op Note (Signed)
Procedure Note  Pre-operative Diagnosis:  Thyroid neoplasm of uncertain behavior, multiple thyroid nodules  Post-operative Diagnosis:  same  Surgeon:  Earnstine Regal, MD, FACS   Procedure:  Total thyroidectomy  Anesthesia:  General  Estimated Blood Loss:  minimal  Drains: none         Specimen: thyroid to pathology  Indications:  Patient is referred by Dr. Lajean Manes for right thyroid nodule with cytologic atypia. Patient was incidentally found to have thyroid nodules while undergoing carotid duplex scanning. Subsequent thyroid ultrasound on August 23, 2015 shows a dominant nodule in the upper pole of the right thyroid lobe measuring 15 x 9 x 11 mm. A second nodule was noted in the mid left thyroid lobe measuring 7 mm. Thyroid tissue was heterogeneous. Subsequent fine-needle aspiration biopsy was performed on August 31, 2015. This shows a suspicious nodule, Bethesda category 4, consistent with a Hurthle cell lesion or neoplasm. Patient does have a history of hypothyroidism and takes Synthroid 50 g daily. Subsequent AFIRMA testing was "suspicious" with a 40% risk of malignancy.  Patient now comes to surgery for total thyroidectomy.  Procedure Details: Procedure was done in OR #2 at the [facility].  The patient was brought to the operating room and placed in a supine position on the operating room table.  Following administration of general anesthesia, the patient was positioned and then prepped and draped in the usual aseptic fashion.  After ascertaining that an adequate level of anesthesia had been achieved, a Kocher incision was made with #15 blade.  Dissection was carried through subcutaneous tissues and platysma. Hemostasis was achieved with the electrocautery.  Skin flaps were elevated cephalad and caudad from the thyroid notch to the sternal notch.  The Mahorner self-retaining retractor was placed for exposure.  Strap muscles were incised in the midline and dissection  was begun on the left side.  Strap muscles were reflected laterally.  Left thyroid lobe was normal sized with multiple small nodules.  The left lobe was gently mobilized with blunt dissection.  Superior pole vessels were dissected out and divided individually between small and medium Ligaclips with the Harmonic scalpel.  The thyroid lobe was rolled anteriorly.  Branches of the inferior thyroid artery were divided between small Ligaclips with the Harmonic scalpel.  Inferior venous tributaries were divided between Ligaclips.  Both the superior and inferior parathyroid glands were identified and preserved on their vascular pedicles.  The recurrent laryngeal nerve was identified and preserved along its course.  The ligament of Gwenlyn Found was released with the electrocautery and the gland was mobilized onto the anterior trachea. Isthmus was mobilized across the midline.  There was no pyramidal lobe present.  Dry pack was placed in the left neck.  Next, the right thyroid lobe was gently mobilized with blunt dissection.  Right thyroid lobe was moderately enlarged with multiple nodules.  Superior pole vessels were dissected out and divided between small and medium Ligaclips with the Harmonic scalpel.  Superior parathyroid was identified and preserved.  Inferior venous tributaries were divided between medium Ligaclips with the Harmonic scalpel.  The right thyroid lobe was rolled anteriorly and the branches of the inferior thyroid artery divided between small Ligaclips.  The right recurrent laryngeal nerve was identified and preserved along its course.  The ligament of Gwenlyn Found was released with the electrocautery.  The right thyroid lobe was mobilized onto the anterior trachea and the remainder of the thyroid was dissected off the anterior trachea and the thyroid was completely  excised.  A suture was used to mark the right lobe. The entire thyroid gland was submitted to pathology for review.  The neck was irrigated with warm  saline.  Fibrillar was placed throughout the operative field.  Strap muscles were reapproximated in the midline with interrupted 3-0 Vicryl sutures.  Platysma was closed with interrupted 3-0 Vicryl sutures.  Skin was closed with a running 4-0 Monocryl subcuticular suture.  Wound was washed and dried and benzoin and steri-strips were applied.  Dry gauze dressing was placed.  The patient was awakened from anesthesia and brought to the recovery room.  The patient tolerated the procedure well.   Earnstine Regal, MD, [facility] Office: [phone]

## 2016-01-26 NOTE — Anesthesia Postprocedure Evaluation (Signed)
Anesthesia Post Note  Patient: Michelle Winters  Procedure(s) Performed: Procedure(s) (LRB): TOTAL THYROIDECTOMY ([name])  Patient location during evaluation: PACU Anesthesia Type: General Level of consciousness: sedated Pain management: pain level controlled Vital Signs Assessment: post-procedure vital signs reviewed and stable Respiratory status: spontaneous breathing Cardiovascular status: stable Postop Assessment: no signs of nausea or vomiting Anesthetic complications: no     Last Vitals:  Filed Vitals:   01/26/16 1215 01/26/16 1230  BP: 146/81 142/85  Pulse: 59 60  Temp:    Resp: 19 17    Last Pain: There were no vitals filed for this visit. Pain Goal: Patients Stated Pain Goal: 3 (01/26/16 0824)               Esequiel Kleinfelter JR,JOHN Mateo Flow

## 2016-01-26 NOTE — Progress Notes (Signed)
Cardiologist & medtronic rep @ bedside; pacemaker settings adjusted-rate set @ 70; pt resting quietly; VSS; minimal pain; will monitor pt for another hour & transfer to med/surg with tele monitor when room is available. Family updated & brought into PACU for brief visit.

## 2016-01-26 NOTE — Anesthesia Postprocedure Evaluation (Addendum)
Anesthesia Post Note  Patient: Michelle Winters  Procedure(s) Performed: Procedure(s) (LRB): TOTAL THYROIDECTOMY ([name])  Comments: Called to the PACU around 1150 for this patient for what was correctly identified by the CRNA as bigeminy. When i arrived the patient was breathing on her own with an O2 sat of 100%. She was in bigeminy and was conducting a strong pulse of 36-45 with the Pwave beats. I asked for 100mg  of Lidocaine to be given and when somewhere between 20-40 mg were given the pt ceased to have bigeminy and went into either sinus rhythm or junctional rhythm at ~ 60bpm. The heart rate became progressively slower then suddenly degenerated into what appeared to be  atrial or ventricular fibrillation. The patient became unresponsive and chest compressions were started by me while the CRNA bag masked the patient. A code was called but before any medicines or further interventions could be made, another CRNA reported that she could feel a pulse after i had breifly stopped chest compressions. Initially the heart rate was > 100bpm but settled down into the 70's. The patient is now responsive and no longer needs any assistance with her airway. After a discussion with Dr. Harlow Asa it was decided to ask Cardiology to come to the bedside to evaluate the patient and to offer guidance during her recovery phase.      Last Vitals:  Filed Vitals:   01/26/16 1145 01/26/16 1200  BP: 178/104 156/100  Pulse: 89 116  Temp:    Resp: 20 28    Last Pain: There were no vitals filed for this visit. Pain Goal: Patients Stated Pain Goal: 3 (01/26/16 0824)               Honey Zakarian JR,JOHN Mateo Flow

## 2016-01-26 NOTE — Progress Notes (Addendum)
Patient came from OR alert and oriented, moaning in pain. Patient hooked up to monitor and noticed bijemeny. Allie CRNA was not aware of it in case. Dr. Jillyn Hidden notified to bedside. Orders received-100mg  lidocaine IV. Continued to monitor patient, no changes notied orders for Atropine received. Upon returning to bedside Dr. Jillyn Hidden was chest compressioning, code called. Patient came back after 1.5 minutes of chest compressions. Rhythm returned. Patient alert and oriented x4. VSS stable will continue to monitor patient. Dr. Harlow Asa placed patient on telemetry monitoring upon discharge from PACU.

## 2016-01-26 NOTE — Transfer of Care (Addendum)
Immediate Anesthesia Transfer of Care Note  Patient: Michelle Winters  Procedure(s) Performed: Procedure(s): TOTAL THYROIDECTOMY ([name])  Patient Location: PACU  Anesthesia Type:General  Level of Consciousness: awake and alert   Airway & Oxygen Therapy: Patient Spontanous Breathing and Patient connected to nasal cannula oxygen  Post-op Assessment: Report given to RN and Post -op Vital signs reviewed and stable  Post vital signs: Reviewed and stable  Last Vitals:  Filed Vitals:   01/26/16 0816  BP: 152/77  Pulse: 80  Temp: 37 C  Resp: 20    Last Pain: There were no vitals filed for this visit.    Patients Stated Pain Goal: 3 ([id number])  Complications: No apparent anesthesia complications.  Upon arrival in PACU, patient noted to have frequent PVCs.  Then bigeminy occurred.  Dr. Jillyn Hidden called to bedside.  IV lidocaine ordered to help with bigeminy.  Patient then went unresponsive with v fib.  Code called.  CPR started.  Pulse checked positive after 15 seconds.  CPR stopped.  Airway patent.  HR increased to 90s.  Surgeon at bedside.  VSS upon final handoff to PACU RN.  Pt maintaining airway.  No bigeminy noted and BP wnl.  See Dr. Lennox Grumbles note for additional details.

## 2016-01-26 NOTE — Progress Notes (Addendum)
Patient stable. Cardiology, pacemaker rep, and Dr. Jillyn Hidden at bedside. Monitor showed 20PVCs No new concerns, patient arousable, alert and oriented. Dr. Jillyn Hidden went to update family of incidents and further planning. Will continue to monitor closely

## 2016-01-27 ENCOUNTER — Encounter (HOSPITAL_COMMUNITY): Payer: Self-pay | Admitting: Surgery

## 2016-01-27 DIAGNOSIS — I495 Sick sinus syndrome: Secondary | ICD-10-CM | POA: Diagnosis not present

## 2016-01-27 DIAGNOSIS — D34 Benign neoplasm of thyroid gland: Secondary | ICD-10-CM | POA: Diagnosis not present

## 2016-01-27 DIAGNOSIS — Z950 Presence of cardiac pacemaker: Secondary | ICD-10-CM | POA: Diagnosis not present

## 2016-01-27 DIAGNOSIS — I129 Hypertensive chronic kidney disease with stage 1 through stage 4 chronic kidney disease, or unspecified chronic kidney disease: Secondary | ICD-10-CM | POA: Diagnosis not present

## 2016-01-27 DIAGNOSIS — I493 Ventricular premature depolarization: Secondary | ICD-10-CM | POA: Diagnosis not present

## 2016-01-27 LAB — BASIC METABOLIC PANEL
ANION GAP: 8 (ref 5–15)
Anion gap: 7 (ref 5–15)
BUN: 21 mg/dL — ABNORMAL HIGH (ref 6–20)
BUN: 18 mg/dL (ref 6–20)
CALCIUM: 7.7 mg/dL — AB (ref 8.9–10.3)
CHLORIDE: 104 mmol/L (ref 101–111)
CO2: 26 mmol/L (ref 22–32)
CO2: 23 mmol/L (ref 22–32)
CREATININE: 1.18 mg/dL — AB (ref 0.44–1.00)
Calcium: 8.3 mg/dL — ABNORMAL LOW (ref 8.9–10.3)
Chloride: 103 mmol/L (ref 101–111)
Creatinine, Ser: 1.06 mg/dL — ABNORMAL HIGH (ref 0.44–1.00)
GFR calc non Af Amer: 42 mL/min — ABNORMAL LOW (ref >60)
GFR, EST AFRICAN AMERICAN: 55 mL/min — AB (ref >60)
GFR, EST AFRICAN AMERICAN: 48 mL/min — AB (ref >60)
GFR, EST NON AFRICAN AMERICAN: 48 mL/min — AB (ref >60)
GLUCOSE: 139 mg/dL — AB (ref 65–99)
GLUCOSE: 144 mg/dL — AB (ref 65–99)
POTASSIUM: 4.3 mmol/L (ref 3.5–5.1)
Potassium: 4.2 mmol/L (ref 3.5–5.1)
Sodium: 137 mmol/L (ref 135–145)
Sodium: 134 mmol/L — ABNORMAL LOW (ref 135–145)

## 2016-01-27 MED ORDER — CALCITRIOL 0.25 MCG PO CAPS
0.2500 ug | ORAL_CAPSULE | Freq: Two times a day (BID) | ORAL | Status: DC
  Administered 2016-01-27 – 2016-01-28 (×3): 0.25 ug via ORAL
  Filled 2016-01-27 (×3): qty 1

## 2016-01-27 MED ORDER — SODIUM CHLORIDE 0.9 % IV SOLN
2.0000 g | INTRAVENOUS | Status: AC
  Administered 2016-01-27: 2 g via INTRAVENOUS
  Filled 2016-01-27: qty 20

## 2016-01-27 MED ORDER — CALCIUM CARBONATE 1250 (500 CA) MG PO TABS
2.0000 | ORAL_TABLET | Freq: Four times a day (QID) | ORAL | Status: DC
  Administered 2016-01-27 – 2016-01-28 (×4): 1000 mg via ORAL
  Filled 2016-01-27: qty 1
  Filled 2016-01-27: qty 2
  Filled 2016-01-27 (×3): qty 1

## 2016-01-27 NOTE — Progress Notes (Signed)
   SUBJECTIVE: The patient is doing well today.  At this time, she denies chest pain, shortness of breath, or any new concerns.  . calcitRIOL  0.25 mcg Oral BID  . calcium carbonate  2 tablet Oral QID  . calcium gluconate  2 g Intravenous NOW  . hydrochlorothiazide  12.5 mg Oral Daily  . levothyroxine  50 mcg Oral QAC breakfast  . losartan  25 mg Oral Daily  . metoprolol succinate  25 mg Oral Daily   . dextrose 5 % and 0.45 % NaCl with KCl 20 mEq/L 50 mL/hr at 01/26/16 1750    OBJECTIVE: Physical Exam: Filed Vitals:   01/26/16 1600 01/26/16 1629 01/26/16 2040 01/27/16 0424  BP:  145/87 135/83 118/67  Pulse:  70 70 70  Temp: 98.1 F (36.7 C) 97.7 F (36.5 C) 97.3 F (36.3 C) 97.8 F (36.6 C)  TempSrc:  Oral Oral Oral  Resp:  18 19 17   Height:      Weight:      SpO2:  98% 98% 94%    Intake/Output Summary (Last 24 hours) at 01/27/16 0900 Last data filed at 01/27/16 0600  Gross per 24 hour  Intake 2208.33 ml  Output     25 ml  Net 2183.33 ml    Telemetry reveals atrial pacing at 70 bpm, rare PVCs  GEN- The patient is well appearing, alert and oriented x 3 today.   Head- normocephalic, atraumatic Eyes-  Sclera clear, conjunctiva pink Ears- hearing intact Oropharynx- clear Neck- s/p surgery Lungs-  normal work of breathing Heart- Regular rate and rhythm  Skin- no rash or lesion Psych- euthymic mood, full affect Neuro- strength and sensation are intact  LABS: Basic Metabolic Panel:  Recent Labs  01/27/16 0639  NA 137  K 4.3  CL 103  CO2 26  GLUCOSE 139*  BUN 21*  CREATININE 1.06*  CALCIUM 7.7*    ASSESSMENT AND PLAN:  Principal Problem:   Neoplasm of uncertain behavior of thyroid gland Active Problems:   Premature ventricular contraction   Sick sinus syndrome ([facility])   Cardiac arrest ([place])  Doing well post operatively from CV standpoint No arrhythmias overnight Normal pacemaker function by interrogation yesterday  Electrophysiology team to see  as needed while here. Please call with questions.   Thompson Grayer, MD 01/27/2016 9:00 AM

## 2016-01-27 NOTE — Progress Notes (Signed)
Patient ID: Michelle Winters, female   DOB: 13-Feb-1932, 82 y.o.   MRN: [id number]  [facility]  Subjective: Patient in bed, nausea last PM - unable to tolerate dinner.  Objective: Vital signs in last 24 hours: Temp:  [97.3 F (36.3 C)-98.1 F (36.7 C)] 97.8 F (36.6 C) (07/01 0424) Pulse Rate:  [59-116] 70 (07/01 0424) Resp:  [11-28] 17 (07/01 0424) BP: (118-178)/(67-104) 118/67 mmHg (07/01 0424) SpO2:  [92 %-100 %] 94 % (07/01 0424)    Intake/Output from previous day: 06/30 0701 - 07/01 0700 In: 2208.3 [P.O.:600; I.V.:1608.3] Out: 25 [Blood:25] Intake/Output this shift:    Physical Exam: HEENT - sclerae clear, mucous membranes moist Neck - wound dry and intact, minimal STS; voice slightly hoarse Chest - clear bilaterally Ext - no edema, non-tender Neuro - alert & oriented, no focal deficits  Lab Results:  No results for input(s): WBC, HGB, HCT, PLT in the last 72 hours. BMET  Recent Labs  01/27/16 0639  NA 137  K 4.3  CL 103  CO2 26  GLUCOSE 139*  BUN 21*  CREATININE 1.06*  CALCIUM 7.7*   PT/INR No results for input(s): LABPROT, INR in the last 72 hours. Comprehensive Metabolic Panel:    Component Value Date/Time   NA 137 01/27/2016 0639   NA 137 01/18/2016 1500   K 4.3 01/27/2016 0639   K 3.9 01/18/2016 1500   CL 103 01/27/2016 0639   CL 106 01/18/2016 1500   CO2 26 01/27/2016 0639   CO2 25 01/18/2016 1500   BUN 21* 01/27/2016 0639   BUN 19 01/18/2016 1500   CREATININE 1.06* 01/27/2016 0639   CREATININE 1.03* 01/18/2016 1500   GLUCOSE 139* 01/27/2016 0639   GLUCOSE 77 01/18/2016 1500   CALCIUM 7.7* 01/27/2016 0639   CALCIUM 8.6* 01/18/2016 1500   AST 21 03/20/2014 0046   ALT 10 03/20/2014 0046   ALKPHOS 74 03/20/2014 0046   BILITOT 0.3 03/20/2014 0046   PROT 5.4* 03/20/2014 0046   ALBUMIN 2.6* 03/20/2014 0046    Studies/Results: Dg Chest 2 View  01/26/2016  CLINICAL DATA:  Preop for thyroidectomy.  EXAM: CHEST  2 VIEW COMPARISON:  Radiograph of May 27, 2014. FINDINGS: The heart size and mediastinal contours are within normal limits. Both lungs are clear. No pneumothorax or pleural effusion is noted. Hyperexpansion of the lungs is noted. Left-sided pacemaker is unchanged in position. The visualized skeletal structures are unremarkable. IMPRESSION: Hyperexpansion of the lungs. No acute cardiopulmonary abnormality seen. Electronically Signed   By: Marijo Conception, M.D.   On: 01/26/2016 08:42    Assessment & Plans: Status post total thyroidectomy for neoplasm of uncertain behavior  Post op hypocalcemia - will give IV calcium gluconate now  Check calcium level later today  Oral calcium and Rocaltrol today and on discharge Post op bradycardia  Cardiology following  Pacemaker studied  Likely home tomorrow, 7/2  Earnstine Regal, MD, [facility] Office: [place] 01/27/2016

## 2016-01-28 DIAGNOSIS — I495 Sick sinus syndrome: Secondary | ICD-10-CM | POA: Diagnosis not present

## 2016-01-28 DIAGNOSIS — Z950 Presence of cardiac pacemaker: Secondary | ICD-10-CM | POA: Diagnosis not present

## 2016-01-28 DIAGNOSIS — D34 Benign neoplasm of thyroid gland: Secondary | ICD-10-CM | POA: Diagnosis not present

## 2016-01-28 DIAGNOSIS — I129 Hypertensive chronic kidney disease with stage 1 through stage 4 chronic kidney disease, or unspecified chronic kidney disease: Secondary | ICD-10-CM | POA: Diagnosis not present

## 2016-01-28 LAB — BASIC METABOLIC PANEL
ANION GAP: 5 (ref 5–15)
BUN: 17 mg/dL (ref 6–20)
CO2: 26 mmol/L (ref 22–32)
Calcium: 7.7 mg/dL — ABNORMAL LOW (ref 8.9–10.3)
Chloride: 101 mmol/L (ref 101–111)
Creatinine, Ser: 1 mg/dL (ref 0.44–1.00)
GFR calc Af Amer: 59 mL/min — ABNORMAL LOW (ref >60)
GFR calc non Af Amer: 51 mL/min — ABNORMAL LOW (ref >60)
GLUCOSE: 131 mg/dL — AB (ref 65–99)
POTASSIUM: 3.9 mmol/L (ref 3.5–5.1)
Sodium: 132 mmol/L — ABNORMAL LOW (ref 135–145)

## 2016-01-28 MED ORDER — SODIUM CHLORIDE 0.9 % IV SOLN
2.0000 g | INTRAVENOUS | Status: AC
  Administered 2016-01-28: 2 g via INTRAVENOUS
  Filled 2016-01-28: qty 20

## 2016-01-28 MED ORDER — CALCIUM CARBONATE 1250 (500 CA) MG PO TABS: 3.0000 | ORAL_TABLET | Freq: Four times a day (QID) | ORAL | Status: DC

## 2016-01-28 MED ORDER — HYDROCODONE-ACETAMINOPHEN 5-325 MG PO TABS: 1.0000 | ORAL_TABLET | ORAL | Status: DC | PRN

## 2016-01-28 MED ORDER — IBUPROFEN 400 MG PO TABS
400.0000 mg | ORAL_TABLET | ORAL | Status: DC | PRN
  Administered 2016-01-28: 400 mg via ORAL
  Filled 2016-01-28: qty 1

## 2016-01-28 MED ORDER — CALCITRIOL 0.25 MCG PO CAPS: 0.2500 ug | ORAL_CAPSULE | Freq: Two times a day (BID) | ORAL | Status: DC

## 2016-01-28 NOTE — Discharge Summary (Signed)
Physician Discharge Summary [facility]  Patient ID: Michelle Winters MRN: [id number] DOB/AGE: Nov 27, 1931 82 y.o.  Admit date: 01/26/2016 Discharge date: 01/28/2016  Admission Diagnoses:  Thyroid neoplasm of uncertain behavior  Discharge Diagnoses:  Principal Problem:   Neoplasm of uncertain behavior of thyroid gland Active Problems:   Premature ventricular contraction   Sick sinus syndrome ([place])   Cardiac arrest [facility])   Discharged Condition: good  Hospital Course: Patient was admitted for observation following thyroid surgery.  Post op course was uncomplicated.  Pain was well controlled.  Tolerated diet.  Post op calcium level on morning following surgery was 7.7 mg/dl.  Patient required IV calcium gluconate, oral calcium carbonate supplements, and oral Rocaltrol.  Patient was evaluated by cardiology due to significant bradycardia in the PACU.  Pacemaker was interrogated.  Patient was monitored on telemetry throughout her hospital stay.  Patient was prepared for discharge home on POD#2.  Consults: cardiology  Treatments: surgery: total thyroidectomy  Discharge Exam: Blood pressure 116/59, pulse 69, temperature 98 F (36.7 C), temperature source Oral, resp. rate 18, height 5' 5.5" (1.664 m), weight 59.421 kg (131 lb), SpO2 95 %. HEENT - clear Neck - wound dry and intact, mild STS; voice normal; no Chvostek's Chest - clear bilaterally Cor - RRR  Disposition: Home  Discharge Instructions    Apply dressing    Complete by:  As directed   Apply light gauze dressing to wound before discharge home today.     Diet - low sodium heart healthy    Complete by:  As directed      Discharge instructions    Complete by:  As directed   [facility]  THYROID & PARATHYROID SURGERY:  POST-OP INSTRUCTIONS  Always review your discharge instruction sheet from the facility where your surgery was performed.  A prescription for pain medication may be given  to you upon discharge.  Take your pain medication as prescribed.  If narcotic pain medicine is not needed, then you may take acetaminophen (Tylenol) or ibuprofen (Advil) as needed.  Take your usually prescribed medications unless otherwise directed.  If you need a refill on your pain medication, please contact your pharmacy. They will contact our office to request authorization.  Prescriptions will not be processed by our office after 5 pm or on weekends.  Start with a light diet upon arrival home, such as soup and crackers or toast.  Be sure to drink plenty of fluids daily.  Resume your normal diet the day after surgery.  Most patients will experience some swelling and bruising on the chest and neck area.  Ice packs will help.  Swelling and bruising can take several days to resolve.   It is common to experience some constipation after surgery.  Increasing fluid intake and taking a stool softener will usually help or prevent this problem.  A mild laxative (Milk of Magnesia or Miralax) should be taken according to package directions if there has been no bowel movement after 48 hours.  You have steri-strips and a gauze dressing over your incision.  You may remove the gauze bandage on the second day after surgery, and you may shower at that time.  Leave your steri-strips (small skin tapes) in place directly over the incision.  These strips should remain on the skin for 5-7 days and then be removed.  You may get them wet in the shower and pat them dry.  You may resume regular (light) daily activities beginning the  next day - such as daily self-care, walking, climbing stairs - gradually increasing activities as tolerated.  You may have sexual intercourse when it is comfortable.  Refrain from any heavy lifting or straining until approved by your doctor.  You may drive when you no longer are taking prescription pain medication, you can comfortably wear a seatbelt, and you can safely maneuver your car and  apply brakes.  You should see your doctor in the office for a follow-up appointment approximately two to three weeks after your surgery.  Make sure that you call for this appointment within a day or two after you arrive home to insure a convenient appointment time.  WHEN TO CALL YOUR DOCTOR: -- Fever greater than 101.5 -- Inability to urinate -- Nausea and/or vomiting - persistent -- Extreme swelling or bruising -- Continued bleeding from incision -- Increased pain, redness, or drainage from the incision -- Difficulty swallowing or breathing -- Muscle cramping or spasms -- Numbness or tingling in hands or around lips  The clinic staff is available to answer your questions during regular business hours.  Please don't hesitate to call and ask to speak to one of the nurses if you have concerns.  Earnstine Regal, MD, [facility] Office: [phone]  Website: [email or web address]     Ice pack    Complete by:  As directed      Increase activity slowly    Complete by:  As directed      No dressing needed    Complete by:  As directed             Medication List    TAKE these medications        aspirin 81 MG tablet  Take 81 mg by mouth daily.     calcitRIOL 0.25 MCG capsule  Commonly known as:  ROCALTROL  Take 1 capsule (0.25 mcg total) by mouth 2 (two) times daily.     calcium carbonate 1250 (500 Ca) MG tablet  Commonly known as:  OS-CAL - dosed in mg of elemental calcium  Take 3 tablets (1,500 mg of elemental calcium total) by mouth 4 (four) times daily.     cyanocobalamin 1000 MCG/ML injection  Commonly known as:  (VITAMIN B-12)  Inject 1,000 mcg into the muscle every 30 (thirty) days.     fluticasone 50 MCG/ACT nasal spray  Commonly known as:  FLONASE  Place 2 sprays into both nostrils daily as needed for allergies.     hydrochlorothiazide 25 MG tablet  Commonly known as:  HYDRODIURIL  Take 12.5 mg by  mouth daily.     HYDROcodone-acetaminophen 5-325 MG tablet  Commonly known as:  NORCO/VICODIN  Take 1-2 tablets by mouth every 4 (four) hours as needed for moderate pain.     levothyroxine 50 MCG tablet  Commonly known as:  SYNTHROID, LEVOTHROID  Take 50 mcg by mouth daily.     loratadine 10 MG tablet  Commonly known as:  CLARITIN  Take 10 mg by mouth daily as needed for allergies.     losartan 25 MG tablet  Commonly known as:  COZAAR  Take 25 mg by mouth daily.     metoprolol succinate 25 MG 24 hr tablet  Commonly known as:  TOPROL-XL  TAKE 1 TABLET (25 MG TOTAL) BY MOUTH DAILY.     sodium chloride 0.65 % Soln nasal spray  Commonly known as:  OCEAN  Place 1 spray into both nostrils as  needed for congestion.           Follow-up Information    Follow up with Earnstine Regal, MD. Schedule an appointment as soon as possible for a visit in 3 weeks.   Specialty:  General Surgery   Why:  For wound re-check   Contact information:   [place] [phone]       Earnstine Regal, MD, [facility] Office: [phone]   Signed: Earnstine Regal 01/28/2016, 7:43 AM

## 2016-01-29 MED FILL — Medication: Qty: 1 | Status: AC

## 2016-01-31 NOTE — Progress Notes (Signed)
Quick Note:  Please contact patient and notify of benign pathology results.  Kerrie Latour M. Terren Haberle, MD, FACS [facility] Office: [phone]   ______ 

## 2016-02-09 DIAGNOSIS — E039 Hypothyroidism, unspecified: Secondary | ICD-10-CM | POA: Diagnosis not present

## 2016-02-09 DIAGNOSIS — E538 Deficiency of other specified B group vitamins: Secondary | ICD-10-CM | POA: Diagnosis not present

## 2016-02-14 DIAGNOSIS — E042 Nontoxic multinodular goiter: Secondary | ICD-10-CM | POA: Diagnosis not present

## 2016-02-26 DIAGNOSIS — E039 Hypothyroidism, unspecified: Secondary | ICD-10-CM | POA: Diagnosis not present

## 2016-02-26 DIAGNOSIS — Z79899 Other long term (current) drug therapy: Secondary | ICD-10-CM | POA: Diagnosis not present

## 2016-02-26 DIAGNOSIS — I129 Hypertensive chronic kidney disease with stage 1 through stage 4 chronic kidney disease, or unspecified chronic kidney disease: Secondary | ICD-10-CM | POA: Diagnosis not present

## 2016-02-26 DIAGNOSIS — Z0000 Encounter for general adult medical examination without abnormal findings: Secondary | ICD-10-CM | POA: Diagnosis not present

## 2016-02-26 DIAGNOSIS — Z1389 Encounter for screening for other disorder: Secondary | ICD-10-CM | POA: Diagnosis not present

## 2016-02-26 DIAGNOSIS — E7800 Pure hypercholesterolemia, unspecified: Secondary | ICD-10-CM | POA: Diagnosis not present

## 2016-02-26 DIAGNOSIS — N183 Chronic kidney disease, stage 3 (moderate): Secondary | ICD-10-CM | POA: Diagnosis not present

## 2016-03-04 ENCOUNTER — Ambulatory Visit (INDEPENDENT_AMBULATORY_CARE_PROVIDER_SITE_OTHER): Payer: Medicare Other | Admitting: *Deleted

## 2016-03-04 DIAGNOSIS — I495 Sick sinus syndrome: Secondary | ICD-10-CM

## 2016-03-04 NOTE — Progress Notes (Signed)
Remote pacemaker transmission.   

## 2016-03-06 ENCOUNTER — Encounter: Payer: Self-pay | Admitting: Cardiology

## 2016-03-07 LAB — CUP PACEART REMOTE DEVICE CHECK
Battery Remaining Longevity: 86 mo
Battery Voltage: 3.01 V
Brady Statistic AP VP Percent: 2.04 %
Brady Statistic AS VS Percent: 1.9 %
Brady Statistic RA Percent Paced: 98.08 %
Brady Statistic RV Percent Paced: 2.05 %
Implantable Lead Implant Date: 20151014
Implantable Lead Location: 753860
Implantable Lead Model: 5076
Lead Channel Impedance Value: 475 Ohm
Lead Channel Impedance Value: 589 Ohm
Lead Channel Pacing Threshold Amplitude: 0.5 V
Lead Channel Pacing Threshold Amplitude: 0.75 V
Lead Channel Pacing Threshold Pulse Width: 0.4 ms
Lead Channel Sensing Intrinsic Amplitude: 1.875 mV
Lead Channel Sensing Intrinsic Amplitude: 19.75 mV
Lead Channel Setting Pacing Amplitude: 2 V
Lead Channel Setting Pacing Pulse Width: 0.4 ms
Lead Channel Setting Sensing Sensitivity: 2 mV
MDC IDC LEAD IMPLANT DT: 20151014
MDC IDC LEAD LOCATION: 753859
MDC IDC MSMT LEADCHNL RA IMPEDANCE VALUE: 380 Ohm
MDC IDC MSMT LEADCHNL RA IMPEDANCE VALUE: 437 Ohm
MDC IDC MSMT LEADCHNL RA SENSING INTR AMPL: 1.875 mV
MDC IDC MSMT LEADCHNL RV PACING THRESHOLD PULSEWIDTH: 0.4 ms
MDC IDC MSMT LEADCHNL RV SENSING INTR AMPL: 19.75 mV
MDC IDC SESS DTM: 20170807154339
MDC IDC SET LEADCHNL RV PACING AMPLITUDE: 2.5 V
MDC IDC STAT BRADY AP VS PERCENT: 96.04 %
MDC IDC STAT BRADY AS VP PERCENT: 0.02 %

## 2016-03-11 DIAGNOSIS — E538 Deficiency of other specified B group vitamins: Secondary | ICD-10-CM | POA: Diagnosis not present

## 2016-04-12 DIAGNOSIS — E039 Hypothyroidism, unspecified: Secondary | ICD-10-CM | POA: Diagnosis not present

## 2016-04-12 DIAGNOSIS — E7800 Pure hypercholesterolemia, unspecified: Secondary | ICD-10-CM | POA: Diagnosis not present

## 2016-04-12 DIAGNOSIS — Z0000 Encounter for general adult medical examination without abnormal findings: Secondary | ICD-10-CM | POA: Diagnosis not present

## 2016-04-12 DIAGNOSIS — E538 Deficiency of other specified B group vitamins: Secondary | ICD-10-CM | POA: Diagnosis not present

## 2016-04-12 DIAGNOSIS — Z79899 Other long term (current) drug therapy: Secondary | ICD-10-CM | POA: Diagnosis not present

## 2016-05-13 DIAGNOSIS — R252 Cramp and spasm: Secondary | ICD-10-CM | POA: Diagnosis not present

## 2016-05-13 DIAGNOSIS — J029 Acute pharyngitis, unspecified: Secondary | ICD-10-CM | POA: Diagnosis not present

## 2016-05-13 DIAGNOSIS — N183 Chronic kidney disease, stage 3 (moderate): Secondary | ICD-10-CM | POA: Diagnosis not present

## 2016-05-13 DIAGNOSIS — E039 Hypothyroidism, unspecified: Secondary | ICD-10-CM | POA: Diagnosis not present

## 2016-05-13 DIAGNOSIS — I129 Hypertensive chronic kidney disease with stage 1 through stage 4 chronic kidney disease, or unspecified chronic kidney disease: Secondary | ICD-10-CM | POA: Diagnosis not present

## 2016-05-13 DIAGNOSIS — R05 Cough: Secondary | ICD-10-CM | POA: Diagnosis not present

## 2016-05-13 DIAGNOSIS — E538 Deficiency of other specified B group vitamins: Secondary | ICD-10-CM | POA: Diagnosis not present

## 2016-06-12 DIAGNOSIS — E039 Hypothyroidism, unspecified: Secondary | ICD-10-CM | POA: Diagnosis not present

## 2016-06-14 DIAGNOSIS — E538 Deficiency of other specified B group vitamins: Secondary | ICD-10-CM | POA: Diagnosis not present

## 2016-06-16 ENCOUNTER — Other Ambulatory Visit: Payer: Self-pay | Admitting: Internal Medicine

## 2016-07-03 NOTE — Progress Notes (Signed)
Electrophysiology Office Note Date: 07/05/2016  ID:  LUKE DEPHILLIPS, DOB 05-10-1932, MRN [id number]  PCP: Mathews Argyle, MD Electrophysiologist: Caryl Comes  CC: Pacemaker follow-up  Michelle Winters is a 82 y.o. female seen today for Dr Caryl Comes.  She presents today for routine electrophysiology followup.  Since last being seen in our clinic, the patient reports doing very well.  She denies chest pain, palpitations, dyspnea, PND, orthopnea, nausea, vomiting, dizziness, syncope, edema, weight gain, or early satiety.  Device History: MDT dual chamber PPM implanted 2015 for symptomatic bradycardia    Past Medical History:  Diagnosis Date  . Arthritis    "hands, fingers, toes" (05/11/2014)  . Bradycardia   . CAD (coronary artery disease)    a. cath 07/01/13: LM no obs dz, LAD widely patent no obs dz, LCx normal in appearance, RCA  small, nondominant vessel. Catheter-induced spasm at the ostium, EF 50-55%  . Cardiomyopathy ([place])    a. Echo 10/15: Inferior and inferolateral AK,  Anterolateral HK, EF 40%;  b.  Echo (1/16):  EF 55-60%, Gr 1 DD, PASP 27, mild to mod TR, trivial AI, mild MR  . Chronic kidney disease (CKD), stage III (moderate)   . Cystitis 1950"s   "tube to bladder was in a kink & I had polyps; surgically corrected"  . Heart murmur    "slight"  . Heart palpitations    a. paroxysmal SVT, PVCs, NSVT. b. echo 03/27/14: EF [id number], nl systolic function, AK of basalinferior myocardium, possible HK of the apicallateral and inferolateral myocardium, Ao trivial regurg, mild MR, mild LAE, trivial pulmonic regurg  . Hypertension   . Hypothyroidism   . Osteopenia 09/2015   T score -2.4 FRAX 17%/6.5%  . Pacemaker    a.  Medtronic MRI compatible ADVISA pulse generator serial number [id number].  . Thyroid nodule   . Wide-complex tachycardia ([place]) 03/25/2014   Past Surgical History:  Procedure Laterality Date  . CARDIAC CATHETERIZATION  06/2013   normal coronary arteries  .  CATARACT EXTRACTION W/ INTRAOCULAR LENS  IMPLANT, BILATERAL Bilateral   . CHOLECYSTECTOMY    . DILATION AND CURETTAGE OF UTERUS    . HYSTEROSCOPY  1950's   "tube to bladder was in a kink & I had polyps; surgically corrected"  . LEFT HEART CATHETERIZATION WITH CORONARY ANGIOGRAM [name] 07/01/2013   Procedure: LEFT HEART CATHETERIZATION WITH CORONARY ANGIOGRAM;  Surgeon: Blane Ohara, MD;  Location: [facility] CATH LAB;  Service: Cardiovascular;  Laterality: [name];  . PACEMAKER INSERTION  05/11/2014   MDT dual chamber Advisa pacemaker implanted by Dr Caryl Comes -- MRI compatible system  . PERMANENT PACEMAKER INSERTION [name] 05/11/2014   Procedure: PERMANENT PACEMAKER INSERTION;  Surgeon: Deboraha Sprang, MD;  Location: [facility] CATH LAB;  Service: Cardiovascular;  Laterality: [name];  . Thyroid nodule biopsy    . THYROIDECTOMY [name] 01/26/2016   Procedure: TOTAL THYROIDECTOMY;  Surgeon: Armandina Gemma, MD;  Location: [place];  Service: General;  Laterality: [name];  . TONSILLECTOMY    . URETER SURGERY Left many years ago? 1960    Current Outpatient Prescriptions  Medication Sig Dispense Refill  . aspirin 81 MG tablet Take 81 mg by mouth daily.    . calcitRIOL (ROCALTROL) 0.25 MCG capsule Take 1 capsule (0.25 mcg total) by mouth 2 (two) times daily. 15 capsule 0  . calcium carbonate (OS-CAL - DOSED IN MG OF ELEMENTAL CALCIUM) 1250 (500 Ca) MG tablet Take 3 tablets (1,500 mg of elemental calcium total) by mouth 4 (four) times  daily. 180 tablet 1  . cyanocobalamin (,VITAMIN B-12,) 1000 MCG/ML injection Inject 1,000 mcg into the muscle every 30 (thirty) days.    . fluticasone (FLONASE) 50 MCG/ACT nasal spray Place 2 sprays into both nostrils daily as needed for allergies.     . hydrochlorothiazide (HYDRODIURIL) 25 MG tablet Take 12.5 mg by mouth daily.     [name] levothyroxine (SYNTHROID, LEVOTHROID) 50 MCG tablet Take 50 mcg by mouth daily.     [name] loratadine (CLARITIN) 10 MG tablet Take 10 mg by mouth daily as needed for allergies.       [name] losartan (COZAAR) 25 MG tablet Take 25 mg by mouth daily.    . metoprolol succinate (TOPROL-XL) 25 MG 24 hr tablet TAKE 1 TABLET (25 MG TOTAL) BY MOUTH DAILY. 30 tablet 0  . sodium chloride (OCEAN) 0.65 % SOLN nasal spray Place 1 spray into both nostrils as needed for congestion.     No current facility-administered medications for this visit.     Allergies:   Lisinopril; Simvastatin; Welchol [colesevelam hcl]; Amiodarone; Ciprofloxacin; and Zetia [ezetimibe]   Social History: Social History   Social History  . Marital status: Married    Spouse name: [name]  . Number of children: [name]  . Years of education: [name]   Occupational History  . Not on file.   Social History Main Topics  . Smoking status: Never Smoker  . Smokeless tobacco: Never Used  . Alcohol use No  . Drug use: No  . Sexual activity: No     Comment: 1st intercourse 60 yo-1 partner   Other Topics Concern  . Not on file   Social History Narrative  . No narrative on file    Family History: Family History  Problem Relation Age of Onset  . Multiple sclerosis Mother   . Heart attack Father   . Cancer Sister     Skin cancer  . Diabetes Sister   . Heart disease Brother   . Cancer Brother     Prostate  . Leukemia Maternal Grandmother   . Nephritis Paternal Grandmother      Review of Systems: All other systems reviewed and are otherwise negative except as noted above.   Physical Exam: VS:  BP 114/66   Pulse 85   Ht 5' 4.5" (1.638 m)   Wt 135 lb (61.2 kg)   BMI 22.81 kg/m  , BMI Body mass index is 22.81 kg/m.  GEN- The patient is well appearing, alert and oriented x 3 today.   HEENT: normocephalic, atraumatic; sclera clear, conjunctiva pink; hearing intact; oropharynx clear; neck supple  Lungs- Clear to ausculation bilaterally, normal work of breathing.  No wheezes, rales, rhonchi Heart- Regular rate and rhythm  GI- soft, non-tender, non-distended, bowel sounds present  Extremities- no clubbing,  cyanosis, or edema  MS- no significant deformity or atrophy Skin- warm and dry, no rash or lesion; PPM pocket well healed Psych- euthymic mood, full affect Neuro- strength and sensation are intact  PPM Interrogation- reviewed in detail today,  See PACEART report  EKG:  EKG is not ordered today.  Recent Labs: 01/18/2016: Hemoglobin 11.6; Platelets 167 01/28/2016: BUN 17; Creatinine, Ser 1.00; Potassium 3.9; Sodium 132   Wt Readings from Last 3 Encounters:  07/04/16 135 lb (61.2 kg)  01/26/16 131 lb (59.4 kg)  01/18/16 131 lb 3.2 oz (59.5 kg)     Other studies Reviewed: Additional studies/ records that were reviewed today include: Dr Olin Pia office notes  Assessment and Plan:  1.  Symptomatic bradycardia Normal PPM function See Pace Art report No changes today  2.  HTN Stable No change required today  Current medicines are reviewed at length with the patient today.   The patient does not have concerns regarding her medicines.  The following changes were made today:  none  Labs/ tests ordered today include: none Orders Placed This Encounter  Procedures  . Implantable device check     Disposition:   Follow up with [facility], Dr Caryl Comes 1 year   Signed, Chanetta Marshall, NP 07/05/2016 12:27 PM  [facility] [address] [place] [address] [phone] (office) [phone] (fax

## 2016-07-04 ENCOUNTER — Ambulatory Visit (INDEPENDENT_AMBULATORY_CARE_PROVIDER_SITE_OTHER): Payer: Medicare Other | Admitting: Nurse Practitioner

## 2016-07-04 VITALS — BP 114/66 | HR 85 | Ht 64.5 in | Wt 135.0 lb

## 2016-07-04 DIAGNOSIS — I10 Essential (primary) hypertension: Secondary | ICD-10-CM | POA: Diagnosis not present

## 2016-07-04 DIAGNOSIS — R001 Bradycardia, unspecified: Secondary | ICD-10-CM | POA: Diagnosis not present

## 2016-07-04 NOTE — Patient Instructions (Signed)
Medication Instructions:    Your physician recommends that you continue on your current medications as directed. Please refer to the Current Medication list given to you today.  --- If you need a refill on your cardiac medications before your next appointment, please call your pharmacy. ---  Labwork:  None ordered  Testing/Procedures:  None ordered  Follow-Up: Remote monitoring is used to monitor your Pacemaker of ICD from home. This monitoring reduces the number of office visits required to check your device to one time per year. It allows [place] to keep an eye on the functioning of your device to ensure it is working properly. You are scheduled for a device check from home on 10/03/2016. You may send your transmission at any time that day. If you have a wireless device, the transmission will be sent automatically. After your physician reviews your transmission, you will receive a postcard with your next transmission date.   Your physician wants you to follow-up in: 1 year with Dr. Caryl Comes.  You will receive a reminder letter in the mail two months in advance. If you don't receive a letter, please call our office to schedule the follow-up appointment.  Thank you for choosing [facility]!!

## 2016-07-05 LAB — CUP PACEART INCLINIC DEVICE CHECK
Implantable Lead Implant Date: 20151014
Implantable Lead Implant Date: 20151014
Implantable Lead Location: 753860
Implantable Lead Model: 5076
Implantable Lead Model: 5076
MDC IDC LEAD LOCATION: 753859
MDC IDC PG IMPLANT DT: 20151014
MDC IDC SESS DTM: 20171208095718

## 2016-07-05 NOTE — Addendum Note (Signed)
Addended by: Claude Manges on: 07/05/2016 01:04 PM   Modules accepted: Orders

## 2016-07-14 ENCOUNTER — Other Ambulatory Visit: Payer: Self-pay | Admitting: Internal Medicine

## 2016-07-17 DIAGNOSIS — E538 Deficiency of other specified B group vitamins: Secondary | ICD-10-CM | POA: Diagnosis not present

## 2016-07-26 IMAGING — CR DG CHEST 2V
2 series · 2 of 2 positions shown · non-contrast
Comparison: 02/21/2010

CLINICAL DATA: Near syncope.  Weakness.

EXAM:
CHEST  2 VIEW

[w chest pa *]
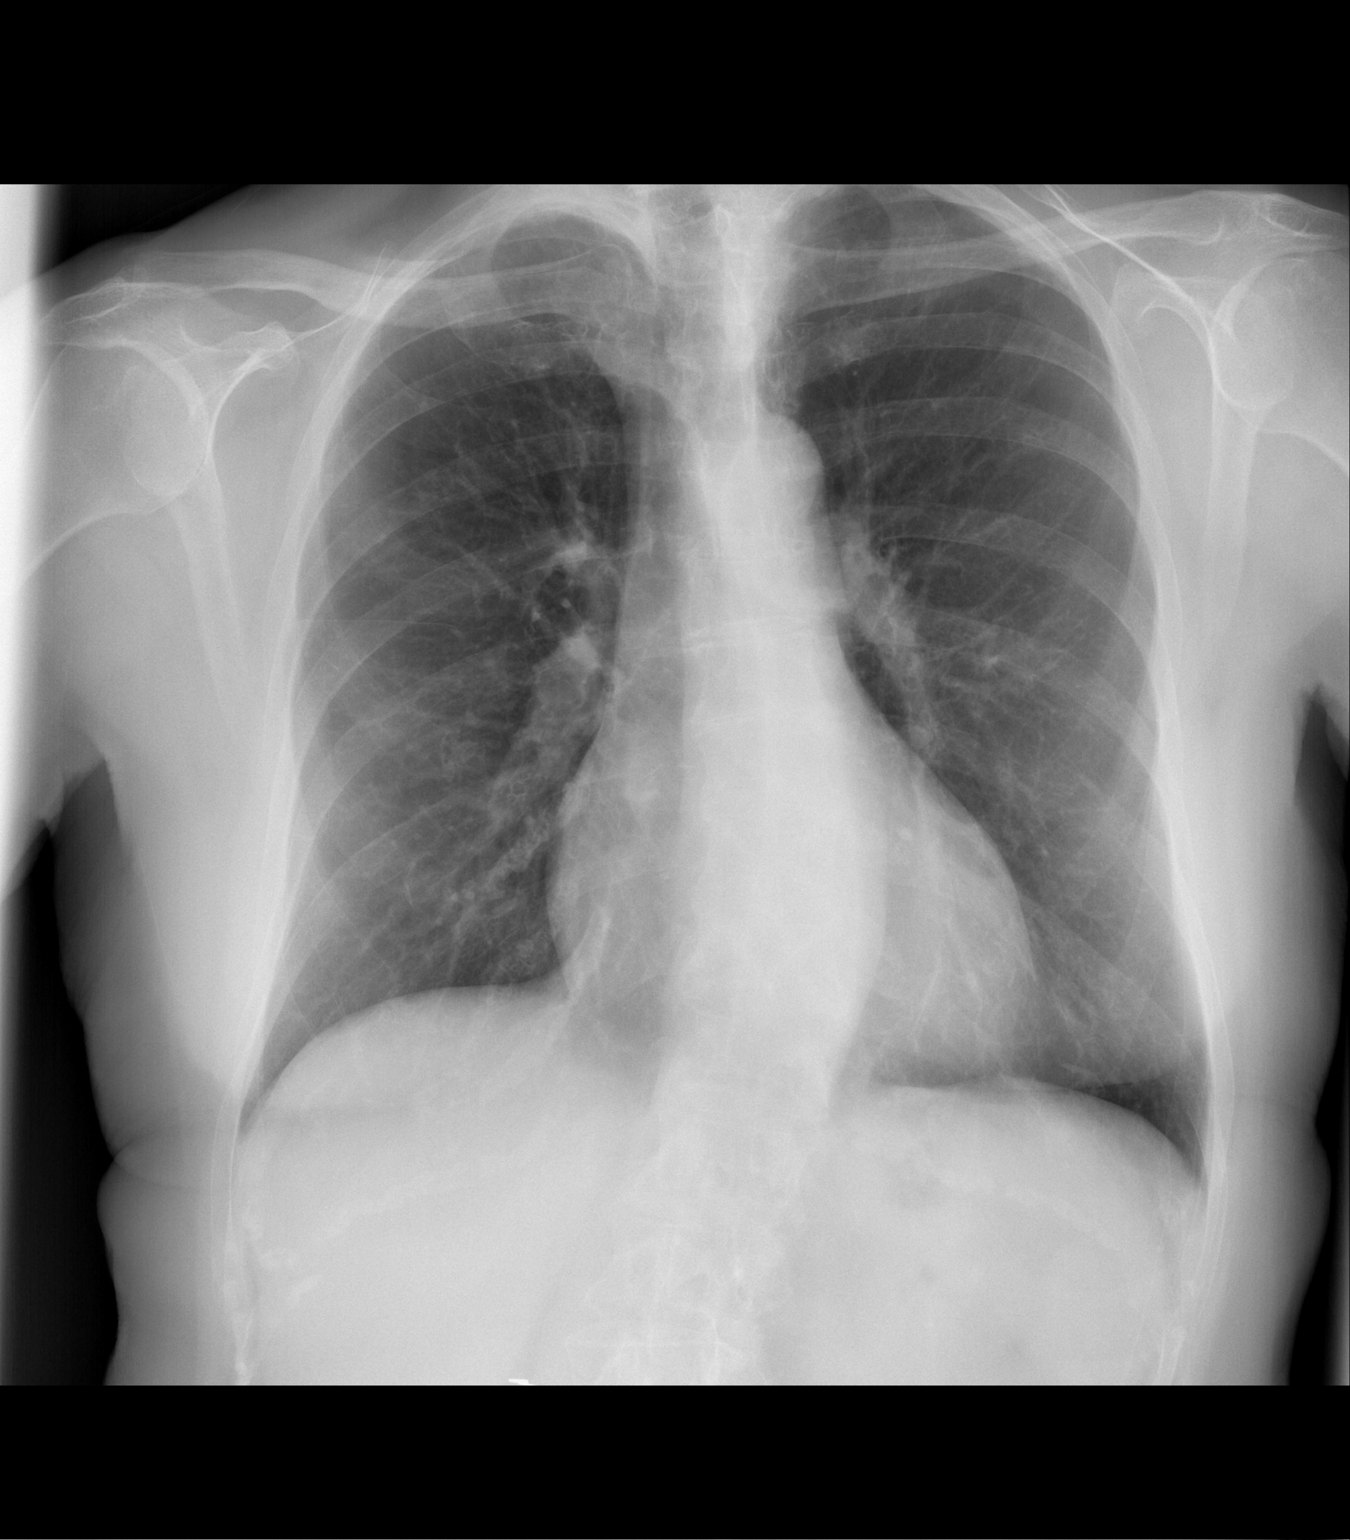

[w chest lat *]
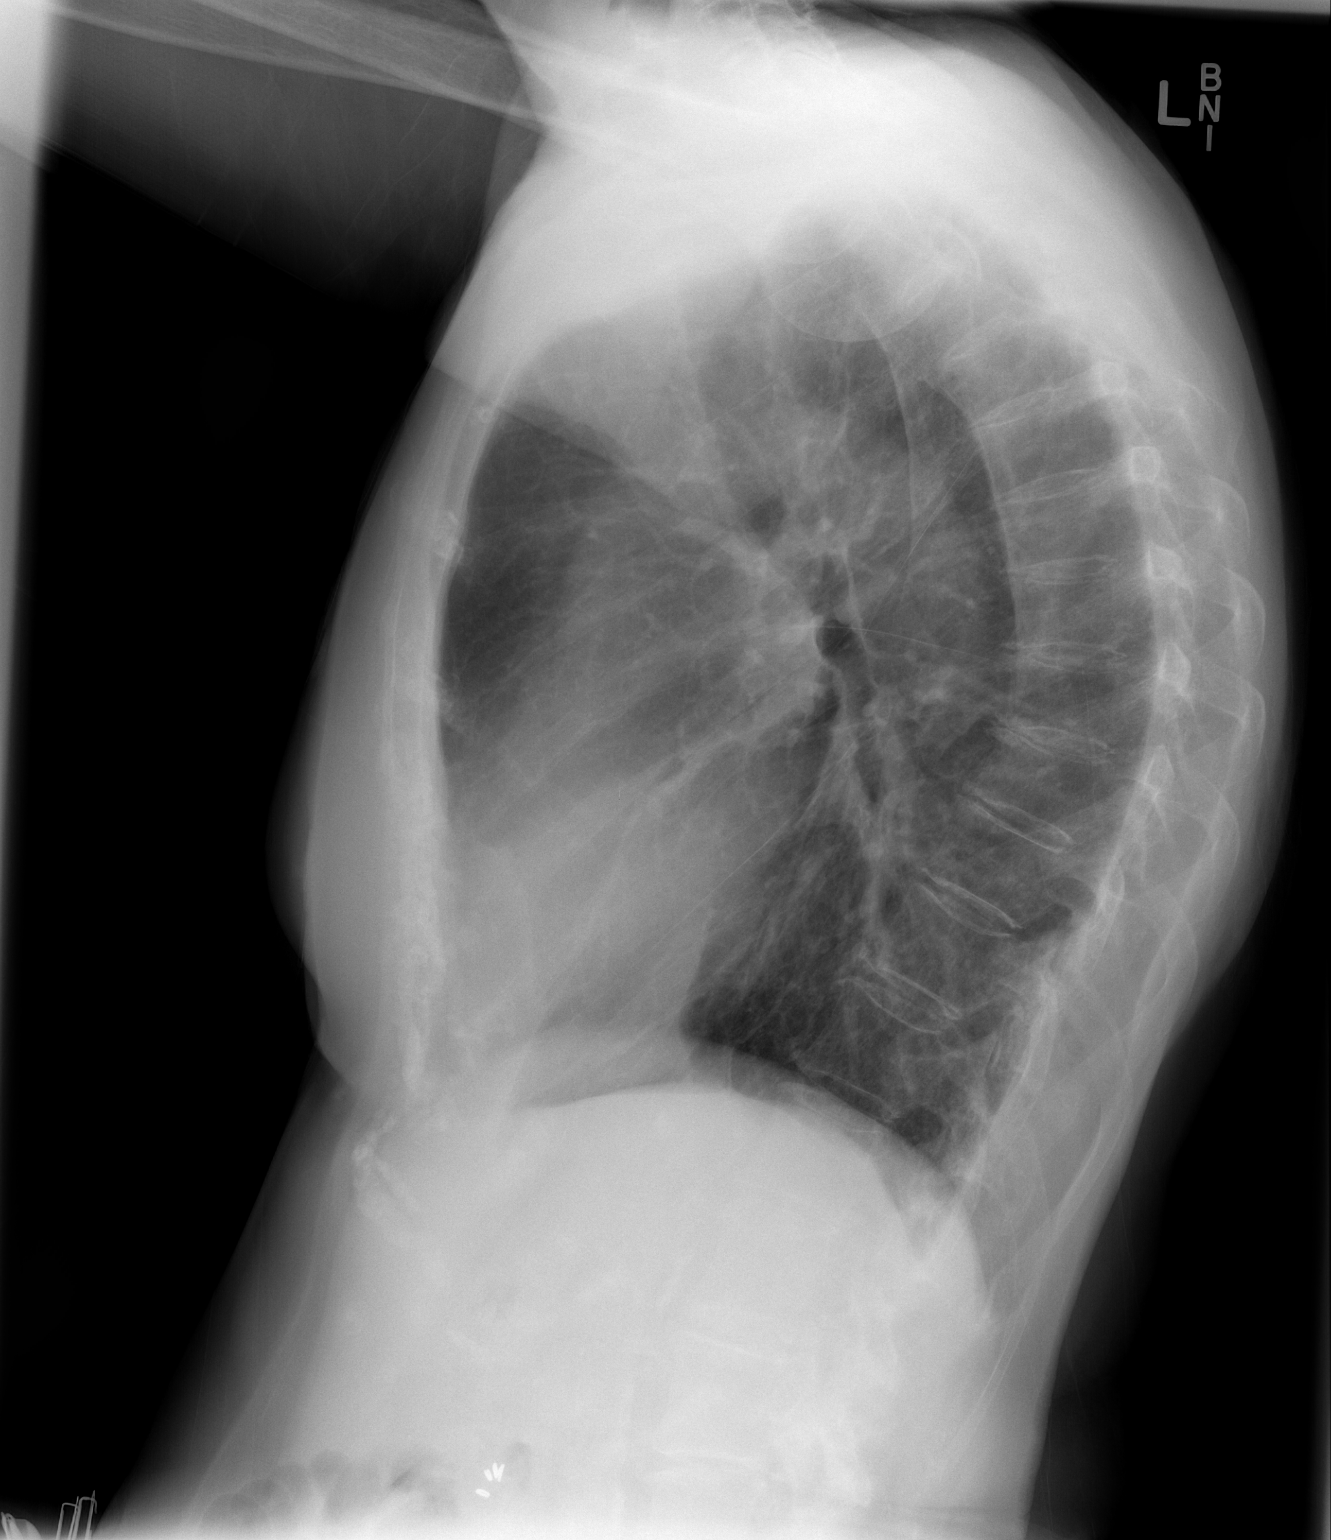

[2 of 2 positions shown; findings below may reference images not displayed]

FINDINGS: The heart size and mediastinal contours are within normal limits.
Chronic interstitial coarsening noted. No airspace consolidation.
The visualized skeletal structures are unremarkable.
IMPRESSION: No active cardiopulmonary disease.

## 2016-08-12 DIAGNOSIS — E039 Hypothyroidism, unspecified: Secondary | ICD-10-CM | POA: Diagnosis not present

## 2016-08-12 DIAGNOSIS — R600 Localized edema: Secondary | ICD-10-CM | POA: Diagnosis not present

## 2016-08-19 DIAGNOSIS — E538 Deficiency of other specified B group vitamins: Secondary | ICD-10-CM | POA: Diagnosis not present

## 2016-08-26 DIAGNOSIS — I129 Hypertensive chronic kidney disease with stage 1 through stage 4 chronic kidney disease, or unspecified chronic kidney disease: Secondary | ICD-10-CM | POA: Diagnosis not present

## 2016-08-26 DIAGNOSIS — I872 Venous insufficiency (chronic) (peripheral): Secondary | ICD-10-CM | POA: Diagnosis not present

## 2016-08-26 DIAGNOSIS — N183 Chronic kidney disease, stage 3 (moderate): Secondary | ICD-10-CM | POA: Diagnosis not present

## 2016-08-26 DIAGNOSIS — R42 Dizziness and giddiness: Secondary | ICD-10-CM | POA: Diagnosis not present

## 2016-09-09 DIAGNOSIS — E039 Hypothyroidism, unspecified: Secondary | ICD-10-CM | POA: Diagnosis not present

## 2016-10-21 DIAGNOSIS — M545 Low back pain: Secondary | ICD-10-CM | POA: Diagnosis not present

## 2016-10-21 DIAGNOSIS — E538 Deficiency of other specified B group vitamins: Secondary | ICD-10-CM | POA: Diagnosis not present

## 2016-11-25 DIAGNOSIS — E538 Deficiency of other specified B group vitamins: Secondary | ICD-10-CM | POA: Diagnosis not present

## 2016-12-20 DIAGNOSIS — N183 Chronic kidney disease, stage 3 (moderate): Secondary | ICD-10-CM | POA: Diagnosis not present

## 2016-12-20 DIAGNOSIS — I129 Hypertensive chronic kidney disease with stage 1 through stage 4 chronic kidney disease, or unspecified chronic kidney disease: Secondary | ICD-10-CM | POA: Diagnosis not present

## 2016-12-20 DIAGNOSIS — E039 Hypothyroidism, unspecified: Secondary | ICD-10-CM | POA: Diagnosis not present

## 2016-12-30 DIAGNOSIS — E538 Deficiency of other specified B group vitamins: Secondary | ICD-10-CM | POA: Diagnosis not present

## 2017-01-09 DIAGNOSIS — E039 Hypothyroidism, unspecified: Secondary | ICD-10-CM | POA: Diagnosis not present

## 2017-01-09 DIAGNOSIS — I129 Hypertensive chronic kidney disease with stage 1 through stage 4 chronic kidney disease, or unspecified chronic kidney disease: Secondary | ICD-10-CM | POA: Diagnosis not present

## 2017-01-09 DIAGNOSIS — N183 Chronic kidney disease, stage 3 (moderate): Secondary | ICD-10-CM | POA: Diagnosis not present

## 2017-01-31 DIAGNOSIS — E538 Deficiency of other specified B group vitamins: Secondary | ICD-10-CM | POA: Diagnosis not present

## 2017-02-03 DIAGNOSIS — R202 Paresthesia of skin: Secondary | ICD-10-CM | POA: Diagnosis not present

## 2017-02-03 DIAGNOSIS — M545 Low back pain: Secondary | ICD-10-CM | POA: Diagnosis not present

## 2017-02-21 DIAGNOSIS — I129 Hypertensive chronic kidney disease with stage 1 through stage 4 chronic kidney disease, or unspecified chronic kidney disease: Secondary | ICD-10-CM | POA: Diagnosis not present

## 2017-02-21 DIAGNOSIS — E039 Hypothyroidism, unspecified: Secondary | ICD-10-CM | POA: Diagnosis not present

## 2017-02-21 DIAGNOSIS — N183 Chronic kidney disease, stage 3 (moderate): Secondary | ICD-10-CM | POA: Diagnosis not present

## 2017-02-26 DIAGNOSIS — M5136 Other intervertebral disc degeneration, lumbar region: Secondary | ICD-10-CM | POA: Diagnosis not present

## 2017-02-26 DIAGNOSIS — M25552 Pain in left hip: Secondary | ICD-10-CM | POA: Diagnosis not present

## 2017-03-04 DIAGNOSIS — Z0000 Encounter for general adult medical examination without abnormal findings: Secondary | ICD-10-CM | POA: Diagnosis not present

## 2017-03-04 DIAGNOSIS — E7800 Pure hypercholesterolemia, unspecified: Secondary | ICD-10-CM | POA: Diagnosis not present

## 2017-03-04 DIAGNOSIS — Z79899 Other long term (current) drug therapy: Secondary | ICD-10-CM | POA: Diagnosis not present

## 2017-03-04 DIAGNOSIS — E039 Hypothyroidism, unspecified: Secondary | ICD-10-CM | POA: Diagnosis not present

## 2017-03-04 DIAGNOSIS — E538 Deficiency of other specified B group vitamins: Secondary | ICD-10-CM | POA: Diagnosis not present

## 2017-03-04 DIAGNOSIS — N183 Chronic kidney disease, stage 3 (moderate): Secondary | ICD-10-CM | POA: Diagnosis not present

## 2017-03-04 DIAGNOSIS — I129 Hypertensive chronic kidney disease with stage 1 through stage 4 chronic kidney disease, or unspecified chronic kidney disease: Secondary | ICD-10-CM | POA: Diagnosis not present

## 2017-03-04 DIAGNOSIS — Z1389 Encounter for screening for other disorder: Secondary | ICD-10-CM | POA: Diagnosis not present

## 2017-04-08 DIAGNOSIS — E538 Deficiency of other specified B group vitamins: Secondary | ICD-10-CM | POA: Diagnosis not present

## 2017-04-21 ENCOUNTER — Ambulatory Visit (INDEPENDENT_AMBULATORY_CARE_PROVIDER_SITE_OTHER): Payer: Medicare Other | Admitting: *Deleted

## 2017-04-21 DIAGNOSIS — R001 Bradycardia, unspecified: Secondary | ICD-10-CM | POA: Diagnosis not present

## 2017-04-21 DIAGNOSIS — I495 Sick sinus syndrome: Secondary | ICD-10-CM

## 2017-04-22 LAB — CUP PACEART REMOTE DEVICE CHECK
Battery Remaining Longevity: 78 mo
Battery Voltage: 3.01 V
Brady Statistic AS VS Percent: 3.14 %
Brady Statistic RV Percent Paced: 1.92 %
Date Time Interrogation Session: 20180923143910
Implantable Lead Implant Date: 20151014
Implantable Lead Model: 5076
Implantable Pulse Generator Implant Date: 20151014
Lead Channel Impedance Value: 399 Ohm
Lead Channel Impedance Value: 570 Ohm
Lead Channel Pacing Threshold Amplitude: 0.375 V
Lead Channel Pacing Threshold Amplitude: 0.625 V
Lead Channel Pacing Threshold Pulse Width: 0.4 ms
Lead Channel Sensing Intrinsic Amplitude: 2.625 mV
Lead Channel Sensing Intrinsic Amplitude: 2.625 mV
Lead Channel Setting Sensing Sensitivity: 2 mV
MDC IDC LEAD IMPLANT DT: 20151014
MDC IDC LEAD LOCATION: 753859
MDC IDC LEAD LOCATION: 753860
MDC IDC MSMT LEADCHNL RA IMPEDANCE VALUE: 361 Ohm
MDC IDC MSMT LEADCHNL RA PACING THRESHOLD PULSEWIDTH: 0.4 ms
MDC IDC MSMT LEADCHNL RV IMPEDANCE VALUE: 456 Ohm
MDC IDC MSMT LEADCHNL RV SENSING INTR AMPL: 17.75 mV
MDC IDC MSMT LEADCHNL RV SENSING INTR AMPL: 17.75 mV
MDC IDC SET LEADCHNL RA PACING AMPLITUDE: 2 V
MDC IDC SET LEADCHNL RV PACING AMPLITUDE: 2.5 V
MDC IDC SET LEADCHNL RV PACING PULSEWIDTH: 0.4 ms
MDC IDC STAT BRADY AP VP PERCENT: 1.47 %
MDC IDC STAT BRADY AP VS PERCENT: 95.38 %
MDC IDC STAT BRADY AS VP PERCENT: 0.01 %
MDC IDC STAT BRADY RA PERCENT PACED: 91.92 %

## 2017-04-22 NOTE — Progress Notes (Signed)
Remote pacemaker transmission.   

## 2017-04-24 ENCOUNTER — Encounter: Payer: Self-pay | Admitting: Cardiology

## 2017-05-13 DIAGNOSIS — E538 Deficiency of other specified B group vitamins: Secondary | ICD-10-CM | POA: Diagnosis not present

## 2017-06-17 DIAGNOSIS — E538 Deficiency of other specified B group vitamins: Secondary | ICD-10-CM | POA: Diagnosis not present

## 2017-07-09 ENCOUNTER — Encounter: Payer: Medicare Other | Admitting: Internal Medicine

## 2017-07-11 ENCOUNTER — Encounter: Payer: Self-pay | Admitting: Internal Medicine

## 2017-07-18 DIAGNOSIS — E538 Deficiency of other specified B group vitamins: Secondary | ICD-10-CM | POA: Diagnosis not present

## 2017-07-21 ENCOUNTER — Other Ambulatory Visit: Payer: Self-pay | Admitting: Internal Medicine

## 2017-07-23 ENCOUNTER — Ambulatory Visit (INDEPENDENT_AMBULATORY_CARE_PROVIDER_SITE_OTHER): Payer: Medicare Other | Admitting: *Deleted

## 2017-07-23 ENCOUNTER — Telehealth: Payer: Self-pay | Admitting: Cardiology

## 2017-07-23 DIAGNOSIS — I495 Sick sinus syndrome: Secondary | ICD-10-CM | POA: Diagnosis not present

## 2017-07-23 NOTE — Telephone Encounter (Signed)
Spoke with pt and reminded pt of remote transmission that is due today. Pt verbalized understanding.   

## 2017-07-24 ENCOUNTER — Encounter: Payer: Self-pay | Admitting: Cardiology

## 2017-07-24 ENCOUNTER — Other Ambulatory Visit: Payer: Self-pay | Admitting: Internal Medicine

## 2017-07-31 NOTE — Progress Notes (Signed)
Remote pacemaker transmission.   

## 2017-08-01 ENCOUNTER — Encounter: Payer: Self-pay | Admitting: Cardiology

## 2017-08-04 LAB — CUP PACEART REMOTE DEVICE CHECK
Brady Statistic AP VP Percent: 1.75 %
Brady Statistic AS VP Percent: 0.01 %
Brady Statistic RA Percent Paced: 92.87 %
Implantable Lead Implant Date: 20151014
Implantable Lead Location: 753859
Implantable Lead Location: 753860
Implantable Lead Model: 5076
Implantable Lead Model: 5076
Lead Channel Impedance Value: 342 Ohm
Lead Channel Impedance Value: 456 Ohm
Lead Channel Pacing Threshold Pulse Width: 0.4 ms
Lead Channel Pacing Threshold Pulse Width: 0.4 ms
Lead Channel Sensing Intrinsic Amplitude: 17.625 mV
Lead Channel Sensing Intrinsic Amplitude: 17.625 mV
Lead Channel Setting Pacing Amplitude: 2 V
Lead Channel Setting Pacing Amplitude: 2.5 V
Lead Channel Setting Pacing Pulse Width: 0.4 ms
MDC IDC LEAD IMPLANT DT: 20151014
MDC IDC MSMT BATTERY REMAINING LONGEVITY: 76 mo
MDC IDC MSMT BATTERY VOLTAGE: 3.01 V
MDC IDC MSMT LEADCHNL RA IMPEDANCE VALUE: 399 Ohm
MDC IDC MSMT LEADCHNL RA PACING THRESHOLD AMPLITUDE: 0.5 V
MDC IDC MSMT LEADCHNL RA SENSING INTR AMPL: 1.125 mV
MDC IDC MSMT LEADCHNL RA SENSING INTR AMPL: 1.125 mV
MDC IDC MSMT LEADCHNL RV IMPEDANCE VALUE: 570 Ohm
MDC IDC MSMT LEADCHNL RV PACING THRESHOLD AMPLITUDE: 0.625 V
MDC IDC PG IMPLANT DT: 20151014
MDC IDC SESS DTM: 20181228001459
MDC IDC SET LEADCHNL RV SENSING SENSITIVITY: 2 mV
MDC IDC STAT BRADY AP VS PERCENT: 96.1 %
MDC IDC STAT BRADY AS VS PERCENT: 2.13 %
MDC IDC STAT BRADY RV PERCENT PACED: 2.34 %

## 2017-08-19 DIAGNOSIS — E538 Deficiency of other specified B group vitamins: Secondary | ICD-10-CM | POA: Diagnosis not present

## 2017-08-29 ENCOUNTER — Encounter: Payer: Self-pay | Admitting: *Deleted

## 2017-09-02 DIAGNOSIS — K1379 Other lesions of oral mucosa: Secondary | ICD-10-CM | POA: Diagnosis not present

## 2017-09-02 DIAGNOSIS — R05 Cough: Secondary | ICD-10-CM | POA: Diagnosis not present

## 2017-09-02 DIAGNOSIS — M270 Developmental disorders of jaws: Secondary | ICD-10-CM | POA: Diagnosis not present

## 2017-09-02 DIAGNOSIS — N183 Chronic kidney disease, stage 3 (moderate): Secondary | ICD-10-CM | POA: Diagnosis not present

## 2017-09-02 DIAGNOSIS — I129 Hypertensive chronic kidney disease with stage 1 through stage 4 chronic kidney disease, or unspecified chronic kidney disease: Secondary | ICD-10-CM | POA: Diagnosis not present

## 2017-09-08 ENCOUNTER — Ambulatory Visit (INDEPENDENT_AMBULATORY_CARE_PROVIDER_SITE_OTHER): Payer: Medicare Other | Admitting: Internal Medicine

## 2017-09-08 VITALS — BP 116/62 | HR 76 | Ht 64.0 in | Wt 134.0 lb

## 2017-09-08 DIAGNOSIS — R001 Bradycardia, unspecified: Secondary | ICD-10-CM

## 2017-09-08 DIAGNOSIS — I495 Sick sinus syndrome: Secondary | ICD-10-CM

## 2017-09-08 DIAGNOSIS — Z950 Presence of cardiac pacemaker: Secondary | ICD-10-CM | POA: Diagnosis not present

## 2017-09-08 LAB — CUP PACEART INCLINIC DEVICE CHECK
Battery Remaining Longevity: 76 mo
Brady Statistic AP VP Percent: 1.58 %
Brady Statistic AP VS Percent: 95.68 %
Brady Statistic AS VP Percent: 0.01 %
Brady Statistic AS VS Percent: 2.74 %
Brady Statistic RV Percent Paced: 2.08 %
Implantable Lead Implant Date: 20151014
Implantable Lead Location: 753859
Implantable Lead Model: 5076
Implantable Lead Model: 5076
Implantable Pulse Generator Implant Date: 20151014
Lead Channel Impedance Value: 342 Ohm
Lead Channel Impedance Value: 380 Ohm
Lead Channel Impedance Value: 570 Ohm
Lead Channel Pacing Threshold Amplitude: 0.75 V
Lead Channel Sensing Intrinsic Amplitude: 18.75 mV
Lead Channel Setting Pacing Amplitude: 2 V
Lead Channel Setting Sensing Sensitivity: 2 mV
MDC IDC LEAD IMPLANT DT: 20151014
MDC IDC LEAD LOCATION: 753860
MDC IDC MSMT BATTERY VOLTAGE: 3.01 V
MDC IDC MSMT LEADCHNL RA PACING THRESHOLD AMPLITUDE: 0.5 V
MDC IDC MSMT LEADCHNL RA PACING THRESHOLD PULSEWIDTH: 0.4 ms
MDC IDC MSMT LEADCHNL RV IMPEDANCE VALUE: 475 Ohm
MDC IDC MSMT LEADCHNL RV PACING THRESHOLD PULSEWIDTH: 0.4 ms
MDC IDC SESS DTM: 20190211171605
MDC IDC SET LEADCHNL RV PACING AMPLITUDE: 2.5 V
MDC IDC SET LEADCHNL RV PACING PULSEWIDTH: 0.4 ms
MDC IDC STAT BRADY RA PERCENT PACED: 92.16 %

## 2017-09-08 NOTE — Patient Instructions (Addendum)
Medication Instructions:  Your physician recommends that you continue on your current medications as directed. Please refer to the Current Medication list given to you today.  Labwork: None ordered.  Testing/Procedures: None ordered.  Follow-Up: Your physician recommends that you schedule a follow-up appointment in: One year with Dr Caryl Comes  Remote monitoring is used to monitor your Pacemaker from home. This monitoring reduces the number of office visits required to check your device to one time per year. It allows [place] to keep an eye on the functioning of your device to ensure it is working properly. You are scheduled for a device check from home on 10/29/2017. You may send your transmission at any time that day. If you have a wireless device, the transmission will be sent automatically. After your physician reviews your transmission, you will receive a postcard with your next transmission date.   Any Other Special Instructions Will Be Listed Below (If Applicable).     If you need a refill on your cardiac medications before your next appointment, please call your pharmacy.

## 2017-09-08 NOTE — Progress Notes (Signed)
Electrophysiology Office Note   Date:  09/08/2017   ID:  Michelle Winters, DOB Nov 10, 1931, MRN [id number]  PCP:  Lajean Manes, MD  Cardiologist:   Primary Electrophysiologist:    Virl Axe, MD    No chief complaint on file.    History of Present Illness: Michelle Winters is a 84 y.o. female  Seen in follow-up for pacemaker implantation for symptomatic bradycardia accomplished 10/15. The procedure was complicated by a small apical pneumothorax that was followed noninvasively.  Echo 05-13-14 demonstrated EF 40% with hypokinesis of base-anterolateral segment. EF previously 50-55%.   The patient denies chest pain, shortness of breath, nocturnal dyspnea, orthopnea or peripheral edema.  There have been no palpitations, lightheadedness or syncope.   Date Cr K  8/18 0.97 4.5          Past Medical History:  Diagnosis Date  . Arthritis    "hands, fingers, toes" (05/11/2014)  . Bradycardia   . CAD (coronary artery disease)    a. cath 07/01/13: LM no obs dz, LAD widely patent no obs dz, LCx normal in appearance, RCA  small, nondominant vessel. Catheter-induced spasm at the ostium, EF 50-55%  . Cardiomyopathy ([place])    a. Echo 10/15: Inferior and inferolateral AK,  Anterolateral HK, EF 40%;  b.  Echo (1/16):  EF 55-60%, Gr 1 DD, PASP 27, mild to mod TR, trivial AI, mild MR  . Chronic kidney disease (CKD), stage III (moderate) (HCC)   . Cystitis 1950"s   "tube to bladder was in a kink & I had polyps; surgically corrected"  . Heart murmur    "slight"  . Heart palpitations    a. paroxysmal SVT, PVCs, NSVT. b. echo 03/27/14: EF 37-16%, nl systolic function, AK of basalinferior myocardium, possible HK of the apicallateral and inferolateral myocardium, Ao trivial regurg, mild MR, mild LAE, trivial pulmonic regurg  . Hypertension   . Hypothyroidism   . Osteopenia 09/2015   T score -2.4 FRAX 17%/6.5%  . Pacemaker    a.  Medtronic MRI compatible ADVISA pulse generator serial number  [id number].  . Thyroid nodule   . Wide-complex tachycardia ([place]) 03/25/2014   Past Surgical History:  Procedure Laterality Date  . CARDIAC CATHETERIZATION  06/2013   normal coronary arteries  . CATARACT EXTRACTION W/ INTRAOCULAR LENS  IMPLANT, BILATERAL Bilateral   . CHOLECYSTECTOMY    . DILATION AND CURETTAGE OF UTERUS    . HYSTEROSCOPY  1950's   "tube to bladder was in a kink & I had polyps; surgically corrected"  . LEFT HEART CATHETERIZATION WITH CORONARY ANGIOGRAM [name] 07/01/2013   Procedure: LEFT HEART CATHETERIZATION WITH CORONARY ANGIOGRAM;  Surgeon: Blane Ohara, MD;  Location: [facility] CATH LAB;  Service: Cardiovascular;  Laterality: [name];  . PACEMAKER INSERTION  05/11/2014   MDT dual chamber Advisa pacemaker implanted by Dr Caryl Comes -- MRI compatible system  . PERMANENT PACEMAKER INSERTION [name] 05/11/2014   Procedure: PERMANENT PACEMAKER INSERTION;  Surgeon: Deboraha Sprang, MD;  Location: [facility] CATH LAB;  Service: Cardiovascular;  Laterality: [name];  . Thyroid nodule biopsy    . THYROIDECTOMY [name] 01/26/2016   Procedure: TOTAL THYROIDECTOMY;  Surgeon: Armandina Gemma, MD;  Location: [place];  Service: General;  Laterality: [name];  . TONSILLECTOMY    . URETER SURGERY Left many years ago? 1960     Current Outpatient Medications  Medication Sig Dispense Refill  . aspirin 81 MG tablet Take 81 mg by mouth daily.    . cyanocobalamin (,VITAMIN B-12,) 1000  MCG/ML injection Inject 1,000 mcg into the muscle every 30 (thirty) days.    . fluticasone (FLONASE) 50 MCG/ACT nasal spray Place 2 sprays into both nostrils daily as needed for allergies.     . hydrochlorothiazide (HYDRODIURIL) 25 MG tablet Take 12.5 mg by mouth daily.     [name] levothyroxine (SYNTHROID, LEVOTHROID) 125 MCG tablet Take 125 mcg by mouth daily.  3  . loratadine (CLARITIN) 10 MG tablet Take 10 mg by mouth daily as needed for allergies.     [name] losartan (COZAAR) 25 MG tablet Take 25 mg by mouth daily.    . metoprolol succinate (TOPROL-XL) 25  MG 24 hr tablet Take 1 tablet (25 mg total) by mouth daily. Please keep 2/11 at 1:45 pm for additional refills thanks. 90 tablet 0  . sodium chloride (OCEAN) 0.65 % SOLN nasal spray Place 1 spray into both nostrils as needed for congestion.     No current facility-administered medications for this visit.     Allergies:   Lisinopril; Simvastatin; Welchol [colesevelam hcl]; Amiodarone; Ciprofloxacin; and Zetia [ezetimibe]   Social History:  The patient  reports that  has never smoked. she has never used smokeless tobacco. She reports that she does not drink alcohol or use drugs.   Family History:  The patient's    family history includes Cancer in her brother and sister; Diabetes in her sister; Heart attack in her father; Heart disease in her brother; Leukemia in her maternal grandmother; Multiple sclerosis in her mother; Nephritis in her paternal grandmother.    ROS:  Please see the history of present illness.   Otherwise, review of systems is positive for cough .   All other systems are reviewed and negative.    PHYSICAL EXAM: VS:  BP 116/62   Pulse 76   Ht 5\' 4"  (1.626 m)   Wt 134 lb (60.8 kg)   BMI 23.00 kg/m  , BMI Body mass index is 23 kg/m. Well developed and nourished in no acute distress HENT normal Neck supple with JVP-flat Clear Regular rate and rhythm, no murmurs or gallops Abd-soft with active BS No Clubbing cyanosis edema Skin-warm and dry A & Oriented  Grossly normal sensory and motor function   SWF:UXNAT @ 76 APacing with intrinsic conduction 20   Device interrogation is reviewed today in detail.  See PaceArt for details.   Recent Labs: No results found for requested labs within last 8760 hours.    Lipid Panel  No results found for: CHOL, TRIG, HDL, CHOLHDL, VLDL, LDLCALC, LDLDIRECT   Wt Readings from Last 3 Encounters:  09/08/17 134 lb (60.8 kg)  07/04/16 135 lb (61.2 kg)  01/26/16 131 lb (59.4 kg)          ASSESSMENT AND PLAN:  Symptomatic  bradycardia  Pacemaker-Medtronic The patient's device was interrogated.  The information was reviewed. No changes were made in the programming.    Ventricular tachycardia-nonsustained  Hypertension  BP well controlled  PVC burden about 4% some VT nonsustained   Heart rate excursion is adequate and functional status good     Signed, Virl Axe, MD  09/08/2017 2:45 PM     [facility] [address] [place] [address] [phone] (office) [phone] (fax)

## 2017-09-22 DIAGNOSIS — E538 Deficiency of other specified B group vitamins: Secondary | ICD-10-CM | POA: Diagnosis not present

## 2017-10-22 DIAGNOSIS — E538 Deficiency of other specified B group vitamins: Secondary | ICD-10-CM | POA: Diagnosis not present

## 2017-10-29 ENCOUNTER — Telehealth: Payer: Self-pay | Admitting: Cardiology

## 2017-10-29 ENCOUNTER — Ambulatory Visit (INDEPENDENT_AMBULATORY_CARE_PROVIDER_SITE_OTHER): Payer: Medicare Other | Admitting: *Deleted

## 2017-10-29 DIAGNOSIS — I495 Sick sinus syndrome: Secondary | ICD-10-CM | POA: Diagnosis not present

## 2017-10-29 NOTE — Telephone Encounter (Signed)
LMOVM reminding pt to send remote transmission.   

## 2017-10-30 ENCOUNTER — Encounter: Payer: Self-pay | Admitting: Cardiology

## 2017-10-30 NOTE — Progress Notes (Signed)
Remote pacemaker transmission.   

## 2017-11-04 LAB — CUP PACEART REMOTE DEVICE CHECK
Battery Remaining Longevity: 75 mo
Brady Statistic AP VS Percent: 95.69 %
Brady Statistic AS VP Percent: 0.01 %
Brady Statistic AS VS Percent: 1.66 %
Brady Statistic RV Percent Paced: 3.42 %
Implantable Lead Implant Date: 20151014
Implantable Lead Location: 753859
Implantable Lead Model: 5076
Implantable Lead Model: 5076
Lead Channel Impedance Value: 361 Ohm
Lead Channel Impedance Value: 399 Ohm
Lead Channel Pacing Threshold Amplitude: 0.375 V
Lead Channel Pacing Threshold Amplitude: 0.625 V
Lead Channel Pacing Threshold Pulse Width: 0.4 ms
Lead Channel Sensing Intrinsic Amplitude: 0.75 mV
Lead Channel Sensing Intrinsic Amplitude: 0.75 mV
Lead Channel Sensing Intrinsic Amplitude: 17.5 mV
Lead Channel Sensing Intrinsic Amplitude: 17.5 mV
Lead Channel Setting Pacing Amplitude: 2.5 V
Lead Channel Setting Pacing Pulse Width: 0.4 ms
MDC IDC LEAD IMPLANT DT: 20151014
MDC IDC LEAD LOCATION: 753860
MDC IDC MSMT BATTERY VOLTAGE: 3.01 V
MDC IDC MSMT LEADCHNL RA PACING THRESHOLD PULSEWIDTH: 0.4 ms
MDC IDC MSMT LEADCHNL RV IMPEDANCE VALUE: 456 Ohm
MDC IDC MSMT LEADCHNL RV IMPEDANCE VALUE: 551 Ohm
MDC IDC PG IMPLANT DT: 20151014
MDC IDC SESS DTM: 20190403203506
MDC IDC SET LEADCHNL RA PACING AMPLITUDE: 2 V
MDC IDC SET LEADCHNL RV SENSING SENSITIVITY: 2 mV
MDC IDC STAT BRADY AP VP PERCENT: 2.65 %
MDC IDC STAT BRADY RA PERCENT PACED: 88.03 %

## 2017-11-27 DIAGNOSIS — N183 Chronic kidney disease, stage 3 (moderate): Secondary | ICD-10-CM | POA: Diagnosis not present

## 2017-11-27 DIAGNOSIS — E538 Deficiency of other specified B group vitamins: Secondary | ICD-10-CM | POA: Diagnosis not present

## 2017-11-27 DIAGNOSIS — I129 Hypertensive chronic kidney disease with stage 1 through stage 4 chronic kidney disease, or unspecified chronic kidney disease: Secondary | ICD-10-CM | POA: Diagnosis not present

## 2017-11-27 DIAGNOSIS — R231 Pallor: Secondary | ICD-10-CM | POA: Diagnosis not present

## 2017-11-27 DIAGNOSIS — Z79899 Other long term (current) drug therapy: Secondary | ICD-10-CM | POA: Diagnosis not present

## 2017-12-29 DIAGNOSIS — E538 Deficiency of other specified B group vitamins: Secondary | ICD-10-CM | POA: Diagnosis not present

## 2018-01-28 ENCOUNTER — Telehealth: Payer: Self-pay | Admitting: Cardiology

## 2018-01-28 ENCOUNTER — Ambulatory Visit (INDEPENDENT_AMBULATORY_CARE_PROVIDER_SITE_OTHER): Payer: Medicare Other | Admitting: *Deleted

## 2018-01-28 DIAGNOSIS — I495 Sick sinus syndrome: Secondary | ICD-10-CM

## 2018-01-28 NOTE — Telephone Encounter (Signed)
Spoke with pt and reminded pt of remote transmission that is due today. Pt verbalized understanding.   

## 2018-01-30 ENCOUNTER — Encounter: Payer: Self-pay | Admitting: Cardiology

## 2018-01-30 NOTE — Progress Notes (Signed)
Remote pacemaker transmission.   

## 2018-02-06 DIAGNOSIS — E538 Deficiency of other specified B group vitamins: Secondary | ICD-10-CM | POA: Diagnosis not present

## 2018-02-19 LAB — CUP PACEART REMOTE DEVICE CHECK
Battery Voltage: 3 V
Brady Statistic AP VS Percent: 95.56 %
Brady Statistic RA Percent Paced: 89.31 %
Implantable Lead Implant Date: 20151014
Implantable Lead Location: 753860
Implantable Lead Model: 5076
Implantable Lead Model: 5076
Implantable Pulse Generator Implant Date: 20151014
Lead Channel Impedance Value: 361 Ohm
Lead Channel Impedance Value: 399 Ohm
Lead Channel Impedance Value: 456 Ohm
Lead Channel Pacing Threshold Amplitude: 0.625 V
Lead Channel Pacing Threshold Pulse Width: 0.4 ms
Lead Channel Sensing Intrinsic Amplitude: 16 mV
Lead Channel Sensing Intrinsic Amplitude: 16 mV
Lead Channel Sensing Intrinsic Amplitude: 2.5 mV
Lead Channel Setting Pacing Amplitude: 2 V
Lead Channel Setting Pacing Pulse Width: 0.4 ms
MDC IDC LEAD IMPLANT DT: 20151014
MDC IDC LEAD LOCATION: 753859
MDC IDC MSMT BATTERY REMAINING LONGEVITY: 66 mo
MDC IDC MSMT LEADCHNL RA PACING THRESHOLD AMPLITUDE: 0.375 V
MDC IDC MSMT LEADCHNL RA PACING THRESHOLD PULSEWIDTH: 0.4 ms
MDC IDC MSMT LEADCHNL RA SENSING INTR AMPL: 2.5 mV
MDC IDC MSMT LEADCHNL RV IMPEDANCE VALUE: 513 Ohm
MDC IDC SESS DTM: 20190703180931
MDC IDC SET LEADCHNL RV PACING AMPLITUDE: 2.5 V
MDC IDC SET LEADCHNL RV SENSING SENSITIVITY: 2 mV
MDC IDC STAT BRADY AP VP PERCENT: 2.67 %
MDC IDC STAT BRADY AS VP PERCENT: 0.02 %
MDC IDC STAT BRADY AS VS PERCENT: 1.75 %
MDC IDC STAT BRADY RV PERCENT PACED: 3.46 %

## 2018-03-10 DIAGNOSIS — M7061 Trochanteric bursitis, right hip: Secondary | ICD-10-CM | POA: Diagnosis not present

## 2018-03-10 DIAGNOSIS — N183 Chronic kidney disease, stage 3 (moderate): Secondary | ICD-10-CM | POA: Diagnosis not present

## 2018-03-10 DIAGNOSIS — I129 Hypertensive chronic kidney disease with stage 1 through stage 4 chronic kidney disease, or unspecified chronic kidney disease: Secondary | ICD-10-CM | POA: Diagnosis not present

## 2018-03-10 DIAGNOSIS — E538 Deficiency of other specified B group vitamins: Secondary | ICD-10-CM | POA: Diagnosis not present

## 2018-03-10 DIAGNOSIS — I872 Venous insufficiency (chronic) (peripheral): Secondary | ICD-10-CM | POA: Diagnosis not present

## 2018-04-17 DIAGNOSIS — E538 Deficiency of other specified B group vitamins: Secondary | ICD-10-CM | POA: Diagnosis not present

## 2018-04-30 ENCOUNTER — Ambulatory Visit (INDEPENDENT_AMBULATORY_CARE_PROVIDER_SITE_OTHER): Payer: Medicare Other | Admitting: *Deleted

## 2018-04-30 DIAGNOSIS — R001 Bradycardia, unspecified: Secondary | ICD-10-CM

## 2018-04-30 DIAGNOSIS — I495 Sick sinus syndrome: Secondary | ICD-10-CM

## 2018-04-30 NOTE — Progress Notes (Signed)
Remote pacemaker transmission.   

## 2018-05-09 LAB — CUP PACEART REMOTE DEVICE CHECK
Battery Remaining Longevity: 65 mo
Brady Statistic AP VS Percent: 95.22 %
Brady Statistic AS VP Percent: 0.01 %
Brady Statistic AS VS Percent: 2.25 %
Date Time Interrogation Session: 20191003015911
Implantable Lead Implant Date: 20151014
Implantable Lead Location: 753860
Implantable Lead Model: 5076
Lead Channel Impedance Value: 342 Ohm
Lead Channel Pacing Threshold Amplitude: 0.625 V
Lead Channel Pacing Threshold Pulse Width: 0.4 ms
Lead Channel Pacing Threshold Pulse Width: 0.4 ms
Lead Channel Sensing Intrinsic Amplitude: 1.125 mV
Lead Channel Sensing Intrinsic Amplitude: 1.125 mV
Lead Channel Sensing Intrinsic Amplitude: 17 mV
Lead Channel Sensing Intrinsic Amplitude: 17 mV
Lead Channel Setting Pacing Amplitude: 2.5 V
Lead Channel Setting Pacing Pulse Width: 0.4 ms
MDC IDC LEAD IMPLANT DT: 20151014
MDC IDC LEAD LOCATION: 753859
MDC IDC MSMT BATTERY VOLTAGE: 3 V
MDC IDC MSMT LEADCHNL RA IMPEDANCE VALUE: 380 Ohm
MDC IDC MSMT LEADCHNL RA PACING THRESHOLD AMPLITUDE: 0.375 V
MDC IDC MSMT LEADCHNL RV IMPEDANCE VALUE: 475 Ohm
MDC IDC MSMT LEADCHNL RV IMPEDANCE VALUE: 532 Ohm
MDC IDC PG IMPLANT DT: 20151014
MDC IDC SET LEADCHNL RA PACING AMPLITUDE: 2 V
MDC IDC SET LEADCHNL RV SENSING SENSITIVITY: 2 mV
MDC IDC STAT BRADY AP VP PERCENT: 2.52 %
MDC IDC STAT BRADY RA PERCENT PACED: 89.68 %
MDC IDC STAT BRADY RV PERCENT PACED: 3.24 %

## 2018-05-19 DIAGNOSIS — E538 Deficiency of other specified B group vitamins: Secondary | ICD-10-CM | POA: Diagnosis not present

## 2018-06-03 DIAGNOSIS — Z6821 Body mass index (BMI) 21.0-21.9, adult: Secondary | ICD-10-CM | POA: Diagnosis not present

## 2018-06-03 DIAGNOSIS — M542 Cervicalgia: Secondary | ICD-10-CM | POA: Diagnosis not present

## 2018-06-04 IMAGING — CR DG CHEST 2V
2 series · 2 of 2 positions shown · non-contrast
Comparison: Radiograph May 27, 2014.

CLINICAL DATA: Preop for thyroidectomy.

EXAM:
CHEST  2 VIEW

[w chest pa]
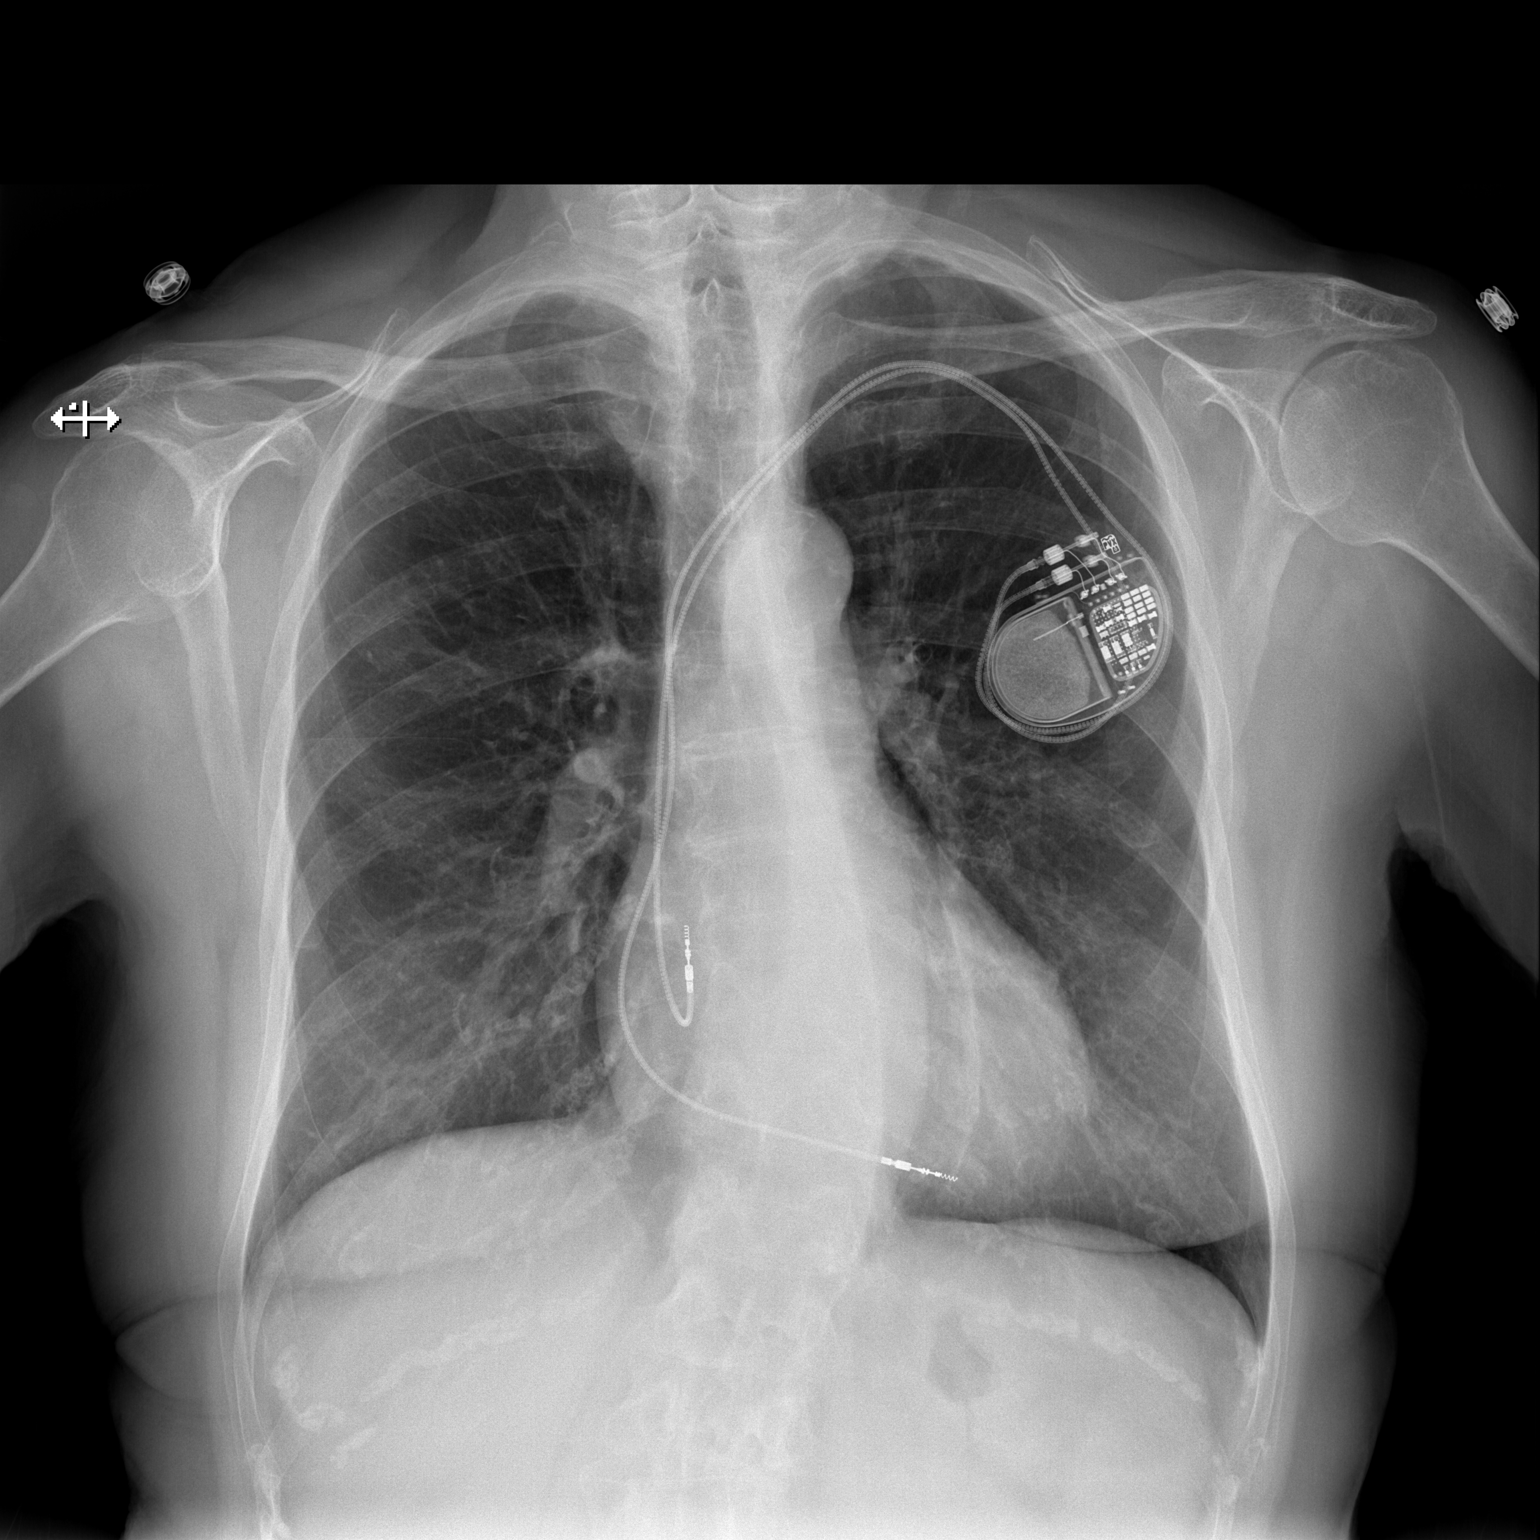

[w chest lat]
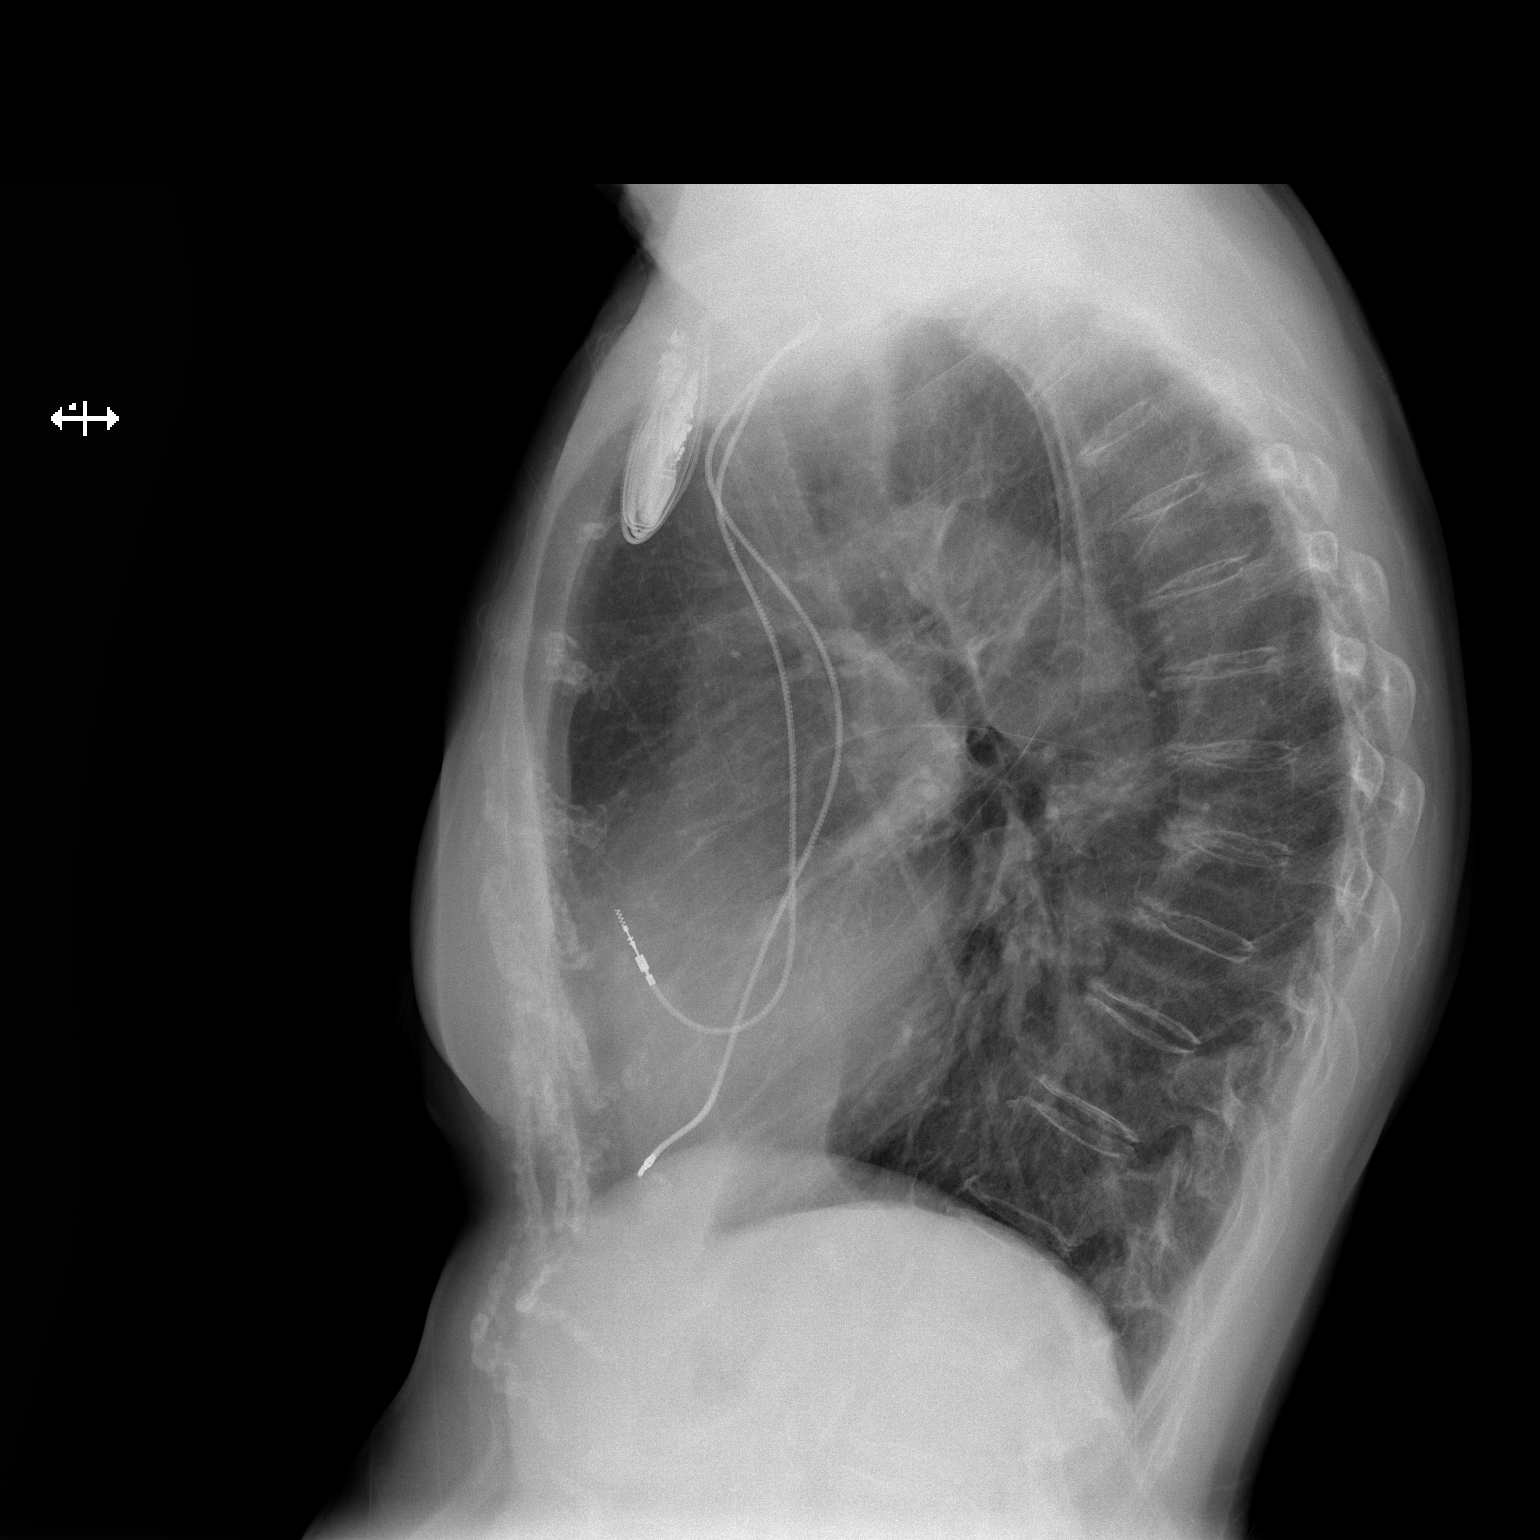

[2 of 2 positions shown; findings below may reference images not displayed]

FINDINGS: The heart size and mediastinal contours are within normal limits.
Both lungs are clear. No pneumothorax or pleural effusion is noted.
Hyperexpansion of the lungs is noted. Left-sided pacemaker is
unchanged in position. The visualized skeletal structures are
unremarkable.
IMPRESSION: Hyperexpansion of the lungs. No acute cardiopulmonary abnormality
seen.

## 2018-07-03 DIAGNOSIS — E538 Deficiency of other specified B group vitamins: Secondary | ICD-10-CM | POA: Diagnosis not present

## 2018-07-30 ENCOUNTER — Ambulatory Visit: Payer: Medicare Other

## 2018-07-31 LAB — CUP PACEART REMOTE DEVICE CHECK
Battery Remaining Longevity: 66 mo
Battery Voltage: 3 V
Brady Statistic AP VP Percent: 2.13 %
Brady Statistic RA Percent Paced: 89.85 %
Brady Statistic RV Percent Paced: 2.85 %
Date Time Interrogation Session: 20200103135752
Implantable Lead Implant Date: 20151014
Implantable Lead Location: 753859
Implantable Lead Location: 753860
Implantable Lead Model: 5076
Implantable Pulse Generator Implant Date: 20151014
Lead Channel Impedance Value: 380 Ohm
Lead Channel Impedance Value: 513 Ohm
Lead Channel Pacing Threshold Amplitude: 0.375 V
Lead Channel Pacing Threshold Pulse Width: 0.4 ms
Lead Channel Pacing Threshold Pulse Width: 0.4 ms
Lead Channel Sensing Intrinsic Amplitude: 2 mV
Lead Channel Setting Pacing Amplitude: 2.5 V
Lead Channel Setting Sensing Sensitivity: 2 mV
MDC IDC LEAD IMPLANT DT: 20151014
MDC IDC MSMT LEADCHNL RA IMPEDANCE VALUE: 418 Ohm
MDC IDC MSMT LEADCHNL RA SENSING INTR AMPL: 2 mV
MDC IDC MSMT LEADCHNL RV IMPEDANCE VALUE: 456 Ohm
MDC IDC MSMT LEADCHNL RV PACING THRESHOLD AMPLITUDE: 0.625 V
MDC IDC MSMT LEADCHNL RV SENSING INTR AMPL: 19.125 mV
MDC IDC MSMT LEADCHNL RV SENSING INTR AMPL: 19.125 mV
MDC IDC SET LEADCHNL RA PACING AMPLITUDE: 2 V
MDC IDC SET LEADCHNL RV PACING PULSEWIDTH: 0.4 ms
MDC IDC STAT BRADY AP VS PERCENT: 94.48 %
MDC IDC STAT BRADY AS VP PERCENT: 0.01 %
MDC IDC STAT BRADY AS VS PERCENT: 3.38 %

## 2018-11-23 DIAGNOSIS — Z1389 Encounter for screening for other disorder: Secondary | ICD-10-CM | POA: Diagnosis not present

## 2018-11-23 DIAGNOSIS — I129 Hypertensive chronic kidney disease with stage 1 through stage 4 chronic kidney disease, or unspecified chronic kidney disease: Secondary | ICD-10-CM | POA: Diagnosis not present

## 2018-11-23 DIAGNOSIS — N183 Chronic kidney disease, stage 3 (moderate): Secondary | ICD-10-CM | POA: Diagnosis not present

## 2018-11-23 DIAGNOSIS — I340 Nonrheumatic mitral (valve) insufficiency: Secondary | ICD-10-CM | POA: Diagnosis not present

## 2018-11-23 DIAGNOSIS — E039 Hypothyroidism, unspecified: Secondary | ICD-10-CM | POA: Diagnosis not present

## 2018-11-23 DIAGNOSIS — Z0000 Encounter for general adult medical examination without abnormal findings: Secondary | ICD-10-CM | POA: Diagnosis not present

## 2018-11-23 DIAGNOSIS — E7800 Pure hypercholesterolemia, unspecified: Secondary | ICD-10-CM | POA: Diagnosis not present

## 2018-11-23 DIAGNOSIS — E538 Deficiency of other specified B group vitamins: Secondary | ICD-10-CM | POA: Diagnosis not present

## 2018-12-23 ENCOUNTER — Telehealth: Payer: Self-pay

## 2018-12-23 NOTE — Telephone Encounter (Signed)
Pt is states she is overdue for an office visit with Dr. Caryl Comes. The pt states she prefers a video visit over an office visit. She do not want to come out due to Covid-19 restrictions. The best number to reach the pt is [phone]

## 2018-12-24 ENCOUNTER — Ambulatory Visit (INDEPENDENT_AMBULATORY_CARE_PROVIDER_SITE_OTHER): Payer: Medicare Other | Admitting: *Deleted

## 2018-12-24 ENCOUNTER — Telehealth: Payer: Self-pay | Admitting: Student

## 2018-12-24 DIAGNOSIS — I495 Sick sinus syndrome: Secondary | ICD-10-CM | POA: Diagnosis not present

## 2018-12-24 LAB — CUP PACEART REMOTE DEVICE CHECK
Battery Remaining Longevity: 58 mo
Battery Voltage: 2.99 V
Brady Statistic AP VP Percent: 1.82 %
Brady Statistic AP VS Percent: 95.85 %
Brady Statistic AS VP Percent: 0.01 %
Brady Statistic AS VS Percent: 2.31 %
Brady Statistic RA Percent Paced: 95.63 %
Brady Statistic RV Percent Paced: 2.64 %
Date Time Interrogation Session: 20200528104059
Implantable Lead Implant Date: 20151014
Implantable Lead Implant Date: 20151014
Implantable Lead Location: 753859
Implantable Lead Location: 753860
Implantable Lead Model: 5076
Implantable Lead Model: 5076
Implantable Pulse Generator Implant Date: 20151014
Lead Channel Impedance Value: 361 Ohm
Lead Channel Impedance Value: 380 Ohm
Lead Channel Impedance Value: 437 Ohm
Lead Channel Impedance Value: 475 Ohm
Lead Channel Pacing Threshold Amplitude: 0.375 V
Lead Channel Pacing Threshold Amplitude: 0.625 V
Lead Channel Pacing Threshold Pulse Width: 0.4 ms
Lead Channel Pacing Threshold Pulse Width: 0.4 ms
Lead Channel Sensing Intrinsic Amplitude: 1.625 mV
Lead Channel Sensing Intrinsic Amplitude: 19.75 mV
Lead Channel Setting Pacing Amplitude: 2 V
Lead Channel Setting Pacing Amplitude: 2.5 V
Lead Channel Setting Pacing Pulse Width: 0.4 ms
Lead Channel Setting Sensing Sensitivity: 2 mV

## 2018-12-24 NOTE — Telephone Encounter (Signed)
Called to discuss remote transmission with 3 episodes NSVT. Hx of same. Asymptomatic. No further. To be scheduled to see Dr. Caryl Comes soon/virtually.    Legrand Como [address]" [place], PA-C 12/24/2018 10:45 AM

## 2018-12-31 ENCOUNTER — Encounter: Payer: Self-pay | Admitting: Cardiology

## 2018-12-31 NOTE — Progress Notes (Signed)
Remote pacemaker transmission.   

## 2019-01-19 ENCOUNTER — Encounter: Payer: Self-pay | Admitting: Internal Medicine

## 2019-01-19 ENCOUNTER — Other Ambulatory Visit: Payer: Self-pay

## 2019-01-19 ENCOUNTER — Telehealth: Payer: Self-pay

## 2019-01-19 ENCOUNTER — Telehealth (INDEPENDENT_AMBULATORY_CARE_PROVIDER_SITE_OTHER): Payer: Medicare Other | Admitting: Internal Medicine

## 2019-01-19 VITALS — BP 131/82 | HR 77 | Temp 97.2°F | Ht 64.0 in | Wt 129.0 lb

## 2019-01-19 DIAGNOSIS — Z950 Presence of cardiac pacemaker: Secondary | ICD-10-CM

## 2019-01-19 DIAGNOSIS — I495 Sick sinus syndrome: Secondary | ICD-10-CM

## 2019-01-19 DIAGNOSIS — R55 Syncope and collapse: Secondary | ICD-10-CM

## 2019-01-19 DIAGNOSIS — R001 Bradycardia, unspecified: Secondary | ICD-10-CM

## 2019-01-19 NOTE — Telephone Encounter (Signed)

## 2019-01-19 NOTE — Progress Notes (Signed)
Electrophysiology TeleHealth Note   Due to national recommendations of social distancing due to COVID 19, an audio/video telehealth visit is felt to be most appropriate for this patient at this time.  See MyChart message from today for the patient's consent to telehealth for [facility].   Date:  01/19/2019   ID:  Michelle Winters, DOB [birth year], MRN [id number]  Location: patient's home  Provider location: [address], [place]  Evaluation Performed: Follow-up visit  PCP:  Lajean Manes, MD  Cardiologist:     Electrophysiologist:  SK   Chief Complaint:  Symptomatic bradycardia  History of Present Illness:    Michelle Winters is a 85 y.o. female who presents via audio/video conferencing for a telehealth visit today. The patient did not have access to video technology/had technical difficulties with video requiring transitioning to audio format only (telephone).  All issues noted in this document were discussed and addressed.  No physical exam could be performed with this format.      Since last being seen in our clinic, the patient reports doing quite well  No sob chest pain or edema    Echo 05-13-14 demonstrated EF 40% with hypokinesis of base-anterolateral segment. EF previously 50-55%.      Date Cr K Hgb  8/18 0.97 4.5   5/19  0.95 4.3 12.5     The patient denies symptoms of fevers, chills, cough, or new SOB worrisome for COVID 19.    Past Medical History:  Diagnosis Date  . Arthritis    "hands, fingers, toes" (05/11/2014)  . Bradycardia   . CAD (coronary artery disease)    a. cath 07/01/13: LM no obs dz, LAD widely patent no obs dz, LCx normal in appearance, RCA  small, nondominant vessel. Catheter-induced spasm at the ostium, EF 50-55%  . Cardiomyopathy ([place])    a. Echo 10/15: Inferior and inferolateral AK,  Anterolateral HK, EF 40%;  b.  Echo (1/16):  EF 55-60%, Gr 1 DD, PASP 27, mild to mod TR, trivial AI, mild MR  . Chronic kidney  disease (CKD), stage III (moderate) (HCC)   . Cystitis 1950"s   "tube to bladder was in a kink & I had polyps; surgically corrected"  . Heart murmur    "slight"  . Heart palpitations    a. paroxysmal SVT, PVCs, NSVT. b. echo 03/27/14: EF 97-02%, nl systolic function, AK of basalinferior myocardium, possible HK of the apicallateral and inferolateral myocardium, Ao trivial regurg, mild MR, mild LAE, trivial pulmonic regurg  . Hypertension   . Hypothyroidism   . Osteopenia 09/2015   T score -2.4 FRAX 17%/6.5%  . Pacemaker    a.  Medtronic MRI compatible ADVISA pulse generator serial number [id number].  . Thyroid nodule   . Wide-complex tachycardia ([place]) 03/25/2014    Past Surgical History:  Procedure Laterality Date  . CARDIAC CATHETERIZATION  06/2013   normal coronary arteries  . CATARACT EXTRACTION W/ INTRAOCULAR LENS  IMPLANT, BILATERAL Bilateral   . CHOLECYSTECTOMY    . DILATION AND CURETTAGE OF UTERUS    . HYSTEROSCOPY  1950's   "tube to bladder was in a kink & I had polyps; surgically corrected"  . LEFT HEART CATHETERIZATION WITH CORONARY ANGIOGRAM [name] 07/01/2013   Procedure: LEFT HEART CATHETERIZATION WITH CORONARY ANGIOGRAM;  Surgeon: Blane Ohara, MD;  Location: [facility] CATH LAB;  Service: Cardiovascular;  Laterality: [name];  . PACEMAKER INSERTION  05/11/2014   MDT dual chamber Advisa pacemaker implanted by  Dr Caryl Comes -- MRI compatible system  . PERMANENT PACEMAKER INSERTION [name] 05/11/2014   Procedure: PERMANENT PACEMAKER INSERTION;  Surgeon: Deboraha Sprang, MD;  Location: [facility] CATH LAB;  Service: Cardiovascular;  Laterality: [name];  . Thyroid nodule biopsy    . THYROIDECTOMY [name] 01/26/2016   Procedure: TOTAL THYROIDECTOMY;  Surgeon: Armandina Gemma, MD;  Location: [place];  Service: General;  Laterality: [name];  . TONSILLECTOMY    . URETER SURGERY Left many years ago? 1960    Current Outpatient Medications  Medication Sig Dispense Refill  . fluticasone (FLONASE) 50 MCG/ACT nasal spray Place  2 sprays into both nostrils daily as needed for allergies.     . hydrochlorothiazide (HYDRODIURIL) 25 MG tablet Take 12.5 mg by mouth daily.     [name] levothyroxine (SYNTHROID, LEVOTHROID) 125 MCG tablet Take 125 mcg by mouth daily.  3  . loratadine (CLARITIN) 10 MG tablet Take 10 mg by mouth daily as needed for allergies.     [name] losartan (COZAAR) 50 MG tablet Take 1 tablet by mouth daily.    . metoprolol succinate (TOPROL-XL) 25 MG 24 hr tablet Take 1 tablet by mouth daily.    . sodium chloride (OCEAN) 0.65 % SOLN nasal spray Place 1 spray into both nostrils as needed for congestion.    . vitamin B-12 (CYANOCOBALAMIN) 1000 MCG tablet Take 1,000 mcg by mouth daily.     No current facility-administered medications for this visit.     Allergies:   Lisinopril, Simvastatin, Welchol [colesevelam hcl], Amiodarone, Ciprofloxacin, and Zetia [ezetimibe]   Social History:  The patient  reports that she has never smoked. She has never used smokeless tobacco. She reports that she does not drink alcohol or use drugs.   Family History:  The patient's   family history includes Cancer in her brother and sister; Diabetes in her sister; Heart attack in her father; Heart disease in her brother; Leukemia in her maternal grandmother; Multiple sclerosis in her mother; Nephritis in her paternal grandmother.   ROS:  Please see the history of present illness.   All other systems are personally reviewed and negative.    Exam:    Vital Signs:  BP 131/82   Pulse 77   Temp (!) 97.2 F (36.2 C)   Ht 5\' 4"  (1.626 m)   Wt 129 lb (58.5 kg)   BMI 22.14 kg/m     Labs/Other Tests and Data Reviewed:    Recent Labs: No results found for requested labs within last 8760 hours.   Wt Readings from Last 3 Encounters:  01/19/19 129 lb (58.5 kg)  09/08/17 134 lb (60.8 kg)  07/04/16 135 lb (61.2 kg)     Other studies personally reviewed: Additional studies/ records that were reviewed today include: As above      Last  device remote is reviewed from [facility] PDF dated 5/20 which reveals normal device function,   arrhythmias   VT nonsustained   ASSESSMENT & PLAN:    Symptomatic bradycardia  Pacemaker-Medtronic    Ventricular tachycardia-nonsustained  Hypertension  Grief discussion    BP well controlled   Discussed the loss of her husband     COVID 19 screen The patient denies symptoms of COVID 19 at this time.  The importance of social distancing was discussed today.  Follow-up:  47m Next remote: *As Scheduled  Current medicines are reviewed at length with the patient today.   The patient does not have concerns regarding her medicines.  The following changes were  made today:  none  Labs/ tests ordered today include:   No orders of the defined types were placed in this encounter.   Future tests ( post COVID )     Patient Risk:  after full review of this patients clinical status, I feel that they are at moderate risk at this time.  Today, I have spent  27 minutes with the patient with telehealth technology discussing the above.  Signed, Virl Axe, MD  01/19/2019 5:48 PM     [place] [address] [place] [address] [phone] (office) [phone] (fax)

## 2019-03-11 DIAGNOSIS — N183 Chronic kidney disease, stage 3 (moderate): Secondary | ICD-10-CM | POA: Diagnosis not present

## 2019-03-11 DIAGNOSIS — Z79899 Other long term (current) drug therapy: Secondary | ICD-10-CM | POA: Diagnosis not present

## 2019-03-11 DIAGNOSIS — I129 Hypertensive chronic kidney disease with stage 1 through stage 4 chronic kidney disease, or unspecified chronic kidney disease: Secondary | ICD-10-CM | POA: Diagnosis not present

## 2019-03-16 DIAGNOSIS — E538 Deficiency of other specified B group vitamins: Secondary | ICD-10-CM | POA: Diagnosis not present

## 2019-03-25 ENCOUNTER — Telehealth: Payer: Self-pay

## 2019-03-25 ENCOUNTER — Ambulatory Visit (INDEPENDENT_AMBULATORY_CARE_PROVIDER_SITE_OTHER): Payer: Medicare Other | Admitting: *Deleted

## 2019-03-25 DIAGNOSIS — I429 Cardiomyopathy, unspecified: Secondary | ICD-10-CM

## 2019-03-25 DIAGNOSIS — I4729 Other ventricular tachycardia: Secondary | ICD-10-CM

## 2019-03-25 DIAGNOSIS — I472 Ventricular tachycardia: Secondary | ICD-10-CM

## 2019-03-25 DIAGNOSIS — I495 Sick sinus syndrome: Secondary | ICD-10-CM | POA: Diagnosis not present

## 2019-03-25 LAB — CUP PACEART REMOTE DEVICE CHECK
Battery Remaining Longevity: 50 mo
Battery Voltage: 2.99 V
Brady Statistic AP VP Percent: 2.79 %
Brady Statistic AP VS Percent: 95.86 %
Brady Statistic AS VP Percent: 0.02 %
Brady Statistic AS VS Percent: 1.33 %
Brady Statistic RA Percent Paced: 97.05 %
Brady Statistic RV Percent Paced: 4.43 %
Date Time Interrogation Session: 20200827104625
Implantable Lead Implant Date: 20151014
Implantable Lead Implant Date: 20151014
Implantable Lead Location: 753859
Implantable Lead Location: 753860
Implantable Lead Model: 5076
Implantable Lead Model: 5076
Implantable Pulse Generator Implant Date: 20151014
Lead Channel Impedance Value: 342 Ohm
Lead Channel Impedance Value: 380 Ohm
Lead Channel Impedance Value: 456 Ohm
Lead Channel Impedance Value: 513 Ohm
Lead Channel Pacing Threshold Amplitude: 0.375 V
Lead Channel Pacing Threshold Amplitude: 0.625 V
Lead Channel Pacing Threshold Pulse Width: 0.4 ms
Lead Channel Pacing Threshold Pulse Width: 0.4 ms
Lead Channel Sensing Intrinsic Amplitude: 17.875 mV
Lead Channel Sensing Intrinsic Amplitude: 2.5 mV
Lead Channel Setting Pacing Amplitude: 2 V
Lead Channel Setting Pacing Amplitude: 2.5 V
Lead Channel Setting Pacing Pulse Width: 0.4 ms
Lead Channel Setting Sensing Sensitivity: 2 mV

## 2019-03-25 NOTE — Telephone Encounter (Signed)
Spoke to pt regarding alerts for VT. 3 episodes all greater than 10 beats. Pt does not recall having any symptoms with these episodes but states that she has been under a lot of stress this year. Will route to Dr. Caryl Comes for review.

## 2019-03-26 NOTE — Telephone Encounter (Signed)
[place]  We should just make bmet and mag are ok  Thanks SK

## 2019-04-01 ENCOUNTER — Other Ambulatory Visit: Payer: Medicare Other

## 2019-04-01 ENCOUNTER — Other Ambulatory Visit: Payer: Medicare Other | Admitting: *Deleted

## 2019-04-01 ENCOUNTER — Other Ambulatory Visit: Payer: Self-pay

## 2019-04-01 DIAGNOSIS — I472 Ventricular tachycardia: Secondary | ICD-10-CM | POA: Diagnosis not present

## 2019-04-01 LAB — BASIC METABOLIC PANEL
BUN/Creatinine Ratio: 15 (ref 12–28)
BUN: 17 mg/dL (ref 8–27)
CO2: 23 mmol/L (ref 20–29)
Calcium: 8.5 mg/dL — ABNORMAL LOW (ref 8.7–10.3)
Chloride: 101 mmol/L (ref 96–106)
Creatinine, Ser: 1.11 mg/dL — ABNORMAL HIGH (ref 0.57–1.00)
GFR calc Af Amer: 52 mL/min/{1.73_m2} — ABNORMAL LOW (ref >59)
GFR calc non Af Amer: 45 mL/min/{1.73_m2} — ABNORMAL LOW (ref >59)
Glucose: 96 mg/dL (ref 65–99)
Potassium: 4 mmol/L (ref 3.5–5.2)
Sodium: 139 mmol/L (ref 134–144)

## 2019-04-01 LAB — MAGNESIUM: Magnesium: 1.9 mg/dL (ref 1.6–2.3)

## 2019-04-01 NOTE — Progress Notes (Signed)
Remote pacemaker transmission.   

## 2019-04-09 ENCOUNTER — Telehealth: Payer: Self-pay | Admitting: Internal Medicine

## 2019-04-09 NOTE — Telephone Encounter (Signed)
Call to patient, left message. Final lab report has not been released by Dr. Caryl Comes. We will call when the results are finalized.   Encouraged the pt to call back with any further questions or concerns.

## 2019-04-09 NOTE — Telephone Encounter (Signed)
New message     Patient is calling for lab results from 04-01-19.

## 2019-04-13 NOTE — Telephone Encounter (Signed)
  Patient calling back stating she still has not heard anything about her test results

## 2019-04-13 NOTE — Telephone Encounter (Signed)
Spoke with pt and reviewed her BMP and Mg with her. I advised her that Dr. Caryl Comes had no yet reviewed her labs since he has been out of the office. If he makes any changes, we will contact her back.   She verbalized understanding and had no additional questions.

## 2019-04-19 DIAGNOSIS — E538 Deficiency of other specified B group vitamins: Secondary | ICD-10-CM | POA: Diagnosis not present

## 2019-05-06 ENCOUNTER — Encounter: Payer: Self-pay | Admitting: Gynecology

## 2019-05-20 DIAGNOSIS — E538 Deficiency of other specified B group vitamins: Secondary | ICD-10-CM | POA: Diagnosis not present

## 2019-06-23 DIAGNOSIS — R202 Paresthesia of skin: Secondary | ICD-10-CM | POA: Diagnosis not present

## 2019-06-23 DIAGNOSIS — I129 Hypertensive chronic kidney disease with stage 1 through stage 4 chronic kidney disease, or unspecified chronic kidney disease: Secondary | ICD-10-CM | POA: Diagnosis not present

## 2019-06-23 DIAGNOSIS — E538 Deficiency of other specified B group vitamins: Secondary | ICD-10-CM | POA: Diagnosis not present

## 2019-06-23 DIAGNOSIS — L989 Disorder of the skin and subcutaneous tissue, unspecified: Secondary | ICD-10-CM | POA: Diagnosis not present

## 2019-06-23 DIAGNOSIS — N1831 Chronic kidney disease, stage 3a: Secondary | ICD-10-CM | POA: Diagnosis not present

## 2019-06-28 ENCOUNTER — Ambulatory Visit (INDEPENDENT_AMBULATORY_CARE_PROVIDER_SITE_OTHER): Payer: Medicare Other | Admitting: *Deleted

## 2019-06-28 DIAGNOSIS — I495 Sick sinus syndrome: Secondary | ICD-10-CM | POA: Diagnosis not present

## 2019-07-01 LAB — CUP PACEART REMOTE DEVICE CHECK
Battery Remaining Longevity: 52 mo
Battery Voltage: 2.99 V
Brady Statistic AP VP Percent: 3.73 %
Brady Statistic AP VS Percent: 94.54 %
Brady Statistic AS VP Percent: 0.02 %
Brady Statistic AS VS Percent: 1.7 %
Brady Statistic RA Percent Paced: 96.04 %
Brady Statistic RV Percent Paced: 6 %
Date Time Interrogation Session: 20201202222609
Implantable Lead Implant Date: 20151014
Implantable Lead Implant Date: 20151014
Implantable Lead Location: 753859
Implantable Lead Location: 753860
Implantable Lead Model: 5076
Implantable Lead Model: 5076
Implantable Pulse Generator Implant Date: 20151014
Lead Channel Impedance Value: 342 Ohm
Lead Channel Impedance Value: 380 Ohm
Lead Channel Impedance Value: 418 Ohm
Lead Channel Impedance Value: 475 Ohm
Lead Channel Pacing Threshold Amplitude: 0.375 V
Lead Channel Pacing Threshold Amplitude: 0.625 V
Lead Channel Pacing Threshold Pulse Width: 0.4 ms
Lead Channel Pacing Threshold Pulse Width: 0.4 ms
Lead Channel Sensing Intrinsic Amplitude: 17.125 mV
Lead Channel Sensing Intrinsic Amplitude: 17.125 mV
Lead Channel Sensing Intrinsic Amplitude: 2.125 mV
Lead Channel Sensing Intrinsic Amplitude: 2.125 mV
Lead Channel Setting Pacing Amplitude: 2 V
Lead Channel Setting Pacing Amplitude: 2.5 V
Lead Channel Setting Pacing Pulse Width: 0.4 ms
Lead Channel Setting Sensing Sensitivity: 2 mV

## 2019-07-26 DIAGNOSIS — E538 Deficiency of other specified B group vitamins: Secondary | ICD-10-CM | POA: Diagnosis not present

## 2019-08-02 DIAGNOSIS — L218 Other seborrheic dermatitis: Secondary | ICD-10-CM | POA: Diagnosis not present

## 2019-08-02 DIAGNOSIS — S40211A Abrasion of right shoulder, initial encounter: Secondary | ICD-10-CM | POA: Diagnosis not present

## 2019-08-02 DIAGNOSIS — L820 Inflamed seborrheic keratosis: Secondary | ICD-10-CM | POA: Diagnosis not present

## 2019-08-02 DIAGNOSIS — D225 Melanocytic nevi of trunk: Secondary | ICD-10-CM | POA: Diagnosis not present

## 2019-08-02 DIAGNOSIS — D1801 Hemangioma of skin and subcutaneous tissue: Secondary | ICD-10-CM | POA: Diagnosis not present

## 2019-08-02 DIAGNOSIS — L821 Other seborrheic keratosis: Secondary | ICD-10-CM | POA: Diagnosis not present

## 2019-08-02 DIAGNOSIS — D485 Neoplasm of uncertain behavior of skin: Secondary | ICD-10-CM | POA: Diagnosis not present

## 2019-08-12 DIAGNOSIS — C44519 Basal cell carcinoma of skin of other part of trunk: Secondary | ICD-10-CM | POA: Diagnosis not present

## 2019-08-26 DIAGNOSIS — D485 Neoplasm of uncertain behavior of skin: Secondary | ICD-10-CM | POA: Diagnosis not present

## 2019-08-26 DIAGNOSIS — Z85828 Personal history of other malignant neoplasm of skin: Secondary | ICD-10-CM | POA: Diagnosis not present

## 2019-08-26 DIAGNOSIS — L905 Scar conditions and fibrosis of skin: Secondary | ICD-10-CM | POA: Diagnosis not present

## 2019-08-26 DIAGNOSIS — C44612 Basal cell carcinoma of skin of right upper limb, including shoulder: Secondary | ICD-10-CM | POA: Diagnosis not present

## 2019-09-02 DIAGNOSIS — E538 Deficiency of other specified B group vitamins: Secondary | ICD-10-CM | POA: Diagnosis not present

## 2019-09-07 DIAGNOSIS — C44612 Basal cell carcinoma of skin of right upper limb, including shoulder: Secondary | ICD-10-CM | POA: Diagnosis not present

## 2019-09-24 DIAGNOSIS — K1120 Sialoadenitis, unspecified: Secondary | ICD-10-CM | POA: Diagnosis not present

## 2019-09-27 ENCOUNTER — Ambulatory Visit (INDEPENDENT_AMBULATORY_CARE_PROVIDER_SITE_OTHER): Payer: Medicare Other | Admitting: *Deleted

## 2019-09-27 DIAGNOSIS — I495 Sick sinus syndrome: Secondary | ICD-10-CM

## 2019-09-27 DIAGNOSIS — K1120 Sialoadenitis, unspecified: Secondary | ICD-10-CM | POA: Diagnosis not present

## 2019-09-27 LAB — CUP PACEART REMOTE DEVICE CHECK
Battery Remaining Longevity: 45 mo
Battery Voltage: 2.98 V
Brady Statistic AP VP Percent: 3.93 %
Brady Statistic AP VS Percent: 94.2 %
Brady Statistic AS VP Percent: 0.02 %
Brady Statistic AS VS Percent: 1.85 %
Brady Statistic RA Percent Paced: 95.91 %
Brady Statistic RV Percent Paced: 6.36 %
Date Time Interrogation Session: 20210301135440
Implantable Lead Implant Date: 20151014
Implantable Lead Implant Date: 20151014
Implantable Lead Location: 753859
Implantable Lead Location: 753860
Implantable Lead Model: 5076
Implantable Lead Model: 5076
Implantable Pulse Generator Implant Date: 20151014
Lead Channel Impedance Value: 361 Ohm
Lead Channel Impedance Value: 380 Ohm
Lead Channel Impedance Value: 456 Ohm
Lead Channel Impedance Value: 513 Ohm
Lead Channel Pacing Threshold Amplitude: 0.375 V
Lead Channel Pacing Threshold Amplitude: 0.625 V
Lead Channel Pacing Threshold Pulse Width: 0.4 ms
Lead Channel Pacing Threshold Pulse Width: 0.4 ms
Lead Channel Sensing Intrinsic Amplitude: 1.875 mV
Lead Channel Sensing Intrinsic Amplitude: 1.875 mV
Lead Channel Sensing Intrinsic Amplitude: 17.875 mV
Lead Channel Sensing Intrinsic Amplitude: 17.875 mV
Lead Channel Setting Pacing Amplitude: 2 V
Lead Channel Setting Pacing Amplitude: 2.5 V
Lead Channel Setting Pacing Pulse Width: 0.4 ms
Lead Channel Setting Sensing Sensitivity: 2 mV

## 2019-09-28 NOTE — Progress Notes (Signed)
PPM Remote  

## 2019-10-01 DIAGNOSIS — E538 Deficiency of other specified B group vitamins: Secondary | ICD-10-CM | POA: Diagnosis not present

## 2019-10-01 DIAGNOSIS — H6123 Impacted cerumen, bilateral: Secondary | ICD-10-CM | POA: Diagnosis not present

## 2019-10-01 DIAGNOSIS — K1120 Sialoadenitis, unspecified: Secondary | ICD-10-CM | POA: Diagnosis not present

## 2019-10-01 DIAGNOSIS — M542 Cervicalgia: Secondary | ICD-10-CM | POA: Diagnosis not present

## 2019-11-05 DIAGNOSIS — E538 Deficiency of other specified B group vitamins: Secondary | ICD-10-CM | POA: Diagnosis not present

## 2019-12-07 DIAGNOSIS — E538 Deficiency of other specified B group vitamins: Secondary | ICD-10-CM | POA: Diagnosis not present

## 2019-12-21 DIAGNOSIS — Z79899 Other long term (current) drug therapy: Secondary | ICD-10-CM | POA: Diagnosis not present

## 2019-12-21 DIAGNOSIS — Z0000 Encounter for general adult medical examination without abnormal findings: Secondary | ICD-10-CM | POA: Diagnosis not present

## 2019-12-21 DIAGNOSIS — L821 Other seborrheic keratosis: Secondary | ICD-10-CM | POA: Diagnosis not present

## 2019-12-21 DIAGNOSIS — Z1389 Encounter for screening for other disorder: Secondary | ICD-10-CM | POA: Diagnosis not present

## 2019-12-21 DIAGNOSIS — N183 Chronic kidney disease, stage 3 unspecified: Secondary | ICD-10-CM | POA: Diagnosis not present

## 2019-12-21 DIAGNOSIS — Z23 Encounter for immunization: Secondary | ICD-10-CM | POA: Diagnosis not present

## 2019-12-21 DIAGNOSIS — N1831 Chronic kidney disease, stage 3a: Secondary | ICD-10-CM | POA: Diagnosis not present

## 2019-12-21 DIAGNOSIS — L299 Pruritus, unspecified: Secondary | ICD-10-CM | POA: Diagnosis not present

## 2019-12-21 DIAGNOSIS — I129 Hypertensive chronic kidney disease with stage 1 through stage 4 chronic kidney disease, or unspecified chronic kidney disease: Secondary | ICD-10-CM | POA: Diagnosis not present

## 2019-12-29 ENCOUNTER — Ambulatory Visit (INDEPENDENT_AMBULATORY_CARE_PROVIDER_SITE_OTHER): Payer: Medicare Other | Admitting: *Deleted

## 2019-12-29 DIAGNOSIS — I495 Sick sinus syndrome: Secondary | ICD-10-CM | POA: Diagnosis not present

## 2019-12-29 LAB — CUP PACEART REMOTE DEVICE CHECK
Battery Remaining Longevity: 41 mo
Battery Voltage: 2.97 V
Brady Statistic AP VP Percent: 3.18 %
Brady Statistic AP VS Percent: 95.37 %
Brady Statistic AS VP Percent: 0.01 %
Brady Statistic AS VS Percent: 1.44 %
Brady Statistic RA Percent Paced: 96.76 %
Brady Statistic RV Percent Paced: 5.17 %
Date Time Interrogation Session: 20210601194031
Implantable Lead Implant Date: 20151014
Implantable Lead Implant Date: 20151014
Implantable Lead Location: 753859
Implantable Lead Location: 753860
Implantable Lead Model: 5076
Implantable Lead Model: 5076
Implantable Pulse Generator Implant Date: 20151014
Lead Channel Impedance Value: 361 Ohm
Lead Channel Impedance Value: 399 Ohm
Lead Channel Impedance Value: 456 Ohm
Lead Channel Impedance Value: 513 Ohm
Lead Channel Pacing Threshold Amplitude: 0.375 V
Lead Channel Pacing Threshold Amplitude: 0.625 V
Lead Channel Pacing Threshold Pulse Width: 0.4 ms
Lead Channel Pacing Threshold Pulse Width: 0.4 ms
Lead Channel Sensing Intrinsic Amplitude: 1.875 mV
Lead Channel Sensing Intrinsic Amplitude: 1.875 mV
Lead Channel Sensing Intrinsic Amplitude: 18.875 mV
Lead Channel Sensing Intrinsic Amplitude: 18.875 mV
Lead Channel Setting Pacing Amplitude: 2 V
Lead Channel Setting Pacing Amplitude: 2.5 V
Lead Channel Setting Pacing Pulse Width: 0.4 ms
Lead Channel Setting Sensing Sensitivity: 2 mV

## 2020-01-04 NOTE — Progress Notes (Signed)
Remote pacemaker transmission.   

## 2020-01-13 DIAGNOSIS — E538 Deficiency of other specified B group vitamins: Secondary | ICD-10-CM | POA: Diagnosis not present

## 2020-02-17 DIAGNOSIS — E538 Deficiency of other specified B group vitamins: Secondary | ICD-10-CM | POA: Diagnosis not present

## 2020-03-21 DIAGNOSIS — E538 Deficiency of other specified B group vitamins: Secondary | ICD-10-CM | POA: Diagnosis not present

## 2020-03-29 ENCOUNTER — Ambulatory Visit (INDEPENDENT_AMBULATORY_CARE_PROVIDER_SITE_OTHER): Payer: Medicare Other | Admitting: *Deleted

## 2020-03-29 DIAGNOSIS — I495 Sick sinus syndrome: Secondary | ICD-10-CM

## 2020-03-29 LAB — CUP PACEART REMOTE DEVICE CHECK
Battery Remaining Longevity: 40 mo
Battery Voltage: 2.97 V
Brady Statistic AP VP Percent: 2.41 %
Brady Statistic AP VS Percent: 96.32 %
Brady Statistic AS VP Percent: 0.01 %
Brady Statistic AS VS Percent: 1.26 %
Brady Statistic RA Percent Paced: 96.69 %
Brady Statistic RV Percent Paced: 3.87 %
Date Time Interrogation Session: 20210901192740
Implantable Lead Implant Date: 20151014
Implantable Lead Implant Date: 20151014
Implantable Lead Location: 753859
Implantable Lead Location: 753860
Implantable Lead Model: 5076
Implantable Lead Model: 5076
Implantable Pulse Generator Implant Date: 20151014
Lead Channel Impedance Value: 361 Ohm
Lead Channel Impedance Value: 380 Ohm
Lead Channel Impedance Value: 475 Ohm
Lead Channel Impedance Value: 532 Ohm
Lead Channel Pacing Threshold Amplitude: 0.25 V
Lead Channel Pacing Threshold Amplitude: 0.625 V
Lead Channel Pacing Threshold Pulse Width: 0.4 ms
Lead Channel Pacing Threshold Pulse Width: 0.4 ms
Lead Channel Sensing Intrinsic Amplitude: 0.875 mV
Lead Channel Sensing Intrinsic Amplitude: 0.875 mV
Lead Channel Sensing Intrinsic Amplitude: 18.625 mV
Lead Channel Sensing Intrinsic Amplitude: 18.625 mV
Lead Channel Setting Pacing Amplitude: 2 V
Lead Channel Setting Pacing Amplitude: 2.5 V
Lead Channel Setting Pacing Pulse Width: 0.4 ms
Lead Channel Setting Sensing Sensitivity: 2 mV

## 2020-03-31 NOTE — Progress Notes (Signed)
Remote pacemaker transmission.   

## 2020-04-25 ENCOUNTER — Telehealth: Payer: Self-pay | Admitting: Internal Medicine

## 2020-04-25 NOTE — Telephone Encounter (Signed)
New Message:    Pt wants to know if her appointment on Friday be a Virtual Visa please?

## 2020-04-25 NOTE — Telephone Encounter (Signed)
Spoke with pt and confirmed she is aware of virtual appointment with Dr Caryl Comes at 415pm on Friday 04/28/2020.

## 2020-04-27 DIAGNOSIS — E538 Deficiency of other specified B group vitamins: Secondary | ICD-10-CM | POA: Diagnosis not present

## 2020-04-28 ENCOUNTER — Encounter: Payer: Self-pay | Admitting: Internal Medicine

## 2020-04-28 ENCOUNTER — Telehealth (INDEPENDENT_AMBULATORY_CARE_PROVIDER_SITE_OTHER): Payer: Medicare Other | Admitting: Internal Medicine

## 2020-04-28 VITALS — Ht 64.0 in | Wt 131.0 lb

## 2020-04-28 DIAGNOSIS — I4729 Other ventricular tachycardia: Secondary | ICD-10-CM

## 2020-04-28 DIAGNOSIS — Z950 Presence of cardiac pacemaker: Secondary | ICD-10-CM | POA: Diagnosis not present

## 2020-04-28 DIAGNOSIS — I472 Ventricular tachycardia: Secondary | ICD-10-CM | POA: Diagnosis not present

## 2020-04-28 NOTE — Progress Notes (Signed)
Electrophysiology TeleHealth Note   Due to national recommendations of social distancing due to COVID 19, an audio/video telehealth visit is felt to be most appropriate for this patient at this time.  See MyChart message from today for the patient's consent to telehealth for [facility].   Date:  04/28/2020   ID:  Michelle Winters, DOB [birth year], MRN [id number]  Location: patient's home  Provider location: [address] [place]  Evaluation Performed: Follow-up visit  PCP:  Lajean Manes, MD  Cardiologist:     Electrophysiologist:  SK   Chief Complaint:  Symptomatic bradycardia  History of Present Illness:    Michelle Winters is a 86 y.o. female who presents via audio/video conferencing for a telehealth visit today. The patient did not have access to video technology/had technical difficulties with video requiring transitioning to audio format only (telephone).  All issues noted in this document were discussed and addressed.  No physical exam could be performed with this format.     Jan, feb June 2020  Loss of husband, brother and sister.    Sons live near by, and grandsons also.( reminds me that she and her husband grew up on the same street--and at the age of 89 decided she was going to marry him and did so 9 years later and they were married for 53 years   The patient denies SOB, chest pain edema or palpitations.  There has been no syncope or presyncope.   Still working in the yard    Echo 05-13-14 demonstrated EF 40% with hypokinesis of base-anterolateral segment. EF previously 50-55%.      Date Cr K Hgb  8/18 0.97 4.5   5/19  0.95 4.3 12.5  9/20 1.11 4.0      The patient denies symptoms of fevers, chills, cough, or new SOB worrisome for COVID 19.    Past Medical History:  Diagnosis Date  . Arthritis    "hands, fingers, toes" (05/11/2014)  . Bradycardia   . CAD (coronary artery disease)    a. cath 07/01/13: LM no obs dz, LAD widely patent  no obs dz, LCx normal in appearance, RCA  small, nondominant vessel. Catheter-induced spasm at the ostium, EF 50-55%  . Cardiomyopathy ([place])    a. Echo 10/15: Inferior and inferolateral AK,  Anterolateral HK, EF 40%;  b.  Echo (1/16):  EF 55-60%, Gr 1 DD, PASP 27, mild to mod TR, trivial AI, mild MR  . Chronic kidney disease (CKD), stage III (moderate) (HCC)   . Cystitis 1950"s   "tube to bladder was in a kink & I had polyps; surgically corrected"  . Heart murmur    "slight"  . Heart palpitations    a. paroxysmal SVT, PVCs, NSVT. b. echo 03/27/14: EF 69-48%, nl systolic function, AK of basalinferior myocardium, possible HK of the apicallateral and inferolateral myocardium, Ao trivial regurg, mild MR, mild LAE, trivial pulmonic regurg  . Hypertension   . Hypothyroidism   . Osteopenia 09/2015   T score -2.4 FRAX 17%/6.5%  . Pacemaker    a.  Medtronic MRI compatible ADVISA pulse generator serial number [id number].  . Thyroid nodule   . Wide-complex tachycardia ([place]) 03/25/2014    Past Surgical History:  Procedure Laterality Date  . CARDIAC CATHETERIZATION  06/2013   normal coronary arteries  . CATARACT EXTRACTION W/ INTRAOCULAR LENS  IMPLANT, BILATERAL Bilateral   . CHOLECYSTECTOMY    . DILATION AND CURETTAGE OF UTERUS    .  HYSTEROSCOPY  1950's   "tube to bladder was in a kink & I had polyps; surgically corrected"  . LEFT HEART CATHETERIZATION WITH CORONARY ANGIOGRAM [name] 07/01/2013   Procedure: LEFT HEART CATHETERIZATION WITH CORONARY ANGIOGRAM;  Surgeon: Blane Ohara, MD;  Location: [facility] CATH LAB;  Service: Cardiovascular;  Laterality: [name];  . PACEMAKER INSERTION  05/11/2014   MDT dual chamber Advisa pacemaker implanted by Dr Caryl Comes -- MRI compatible system  . PERMANENT PACEMAKER INSERTION [name] 05/11/2014   Procedure: PERMANENT PACEMAKER INSERTION;  Surgeon: Deboraha Sprang, MD;  Location: [facility] CATH LAB;  Service: Cardiovascular;  Laterality: [name];  . Thyroid nodule biopsy    .  THYROIDECTOMY [name] 01/26/2016   Procedure: TOTAL THYROIDECTOMY;  Surgeon: Armandina Gemma, MD;  Location: [place];  Service: General;  Laterality: [name];  . TONSILLECTOMY    . URETER SURGERY Left many years ago? 1960    Current Outpatient Medications  Medication Sig Dispense Refill  . Cyanocobalamin (VITAMIN B-12 IJ) Inject 1 Dose as directed every 30 (thirty) days.    . hydrochlorothiazide (HYDRODIURIL) 25 MG tablet Take 12.5 mg by mouth daily.     [name] levothyroxine (SYNTHROID, LEVOTHROID) 125 MCG tablet Take 125 mcg by mouth daily.  3  . loratadine (CLARITIN) 10 MG tablet Take 10 mg by mouth daily as needed for allergies.     [name] losartan (COZAAR) 50 MG tablet Take 1 tablet by mouth daily.    . metoprolol succinate (TOPROL-XL) 25 MG 24 hr tablet Take 1 tablet by mouth daily.    . sodium chloride (OCEAN) 0.65 % SOLN nasal spray Place 1 spray into both nostrils as needed for congestion.     No current facility-administered medications for this visit.    Allergies:   Lisinopril, Simvastatin, Welchol [colesevelam hcl], Amiodarone, Ciprofloxacin, and Zetia [ezetimibe]   Social History:  The patient  reports that she has never smoked. She has never used smokeless tobacco. She reports that she does not drink alcohol and does not use drugs.   Family History:  The patient's   family history includes Cancer in her brother and sister; Diabetes in her sister; Heart attack in her father; Heart disease in her brother; Leukemia in her maternal grandmother; Multiple sclerosis in her mother; Nephritis in her paternal grandmother.   ROS:  Please see the history of present illness.   All other systems are personally reviewed and negative.    Exam:    Vital Signs:  Ht 5\' 4"  (1.626 m)   Wt 131 lb (59.4 kg)   BMI 22.49 kg/m     Labs/Other Tests and Data Reviewed:    Recent Labs: No results found for requested labs within last 8760 hours.   Wt Readings from Last 3 Encounters:  04/28/20 131 lb (59.4 kg)    01/19/19 129 lb (58.5 kg)  09/08/17 134 lb (60.8 kg)     Other studies personally reviewed: Additional studies/ records that were reviewed today include: As above      Last device remote is reviewed from [facility] PDF dated 9/21 which reveals normal device function,   arrhythmias VT nonsustained   ASSESSMENT & PLAN:    Symptomatic bradycardia  Pacemaker-Medtronic    Ventricular tachycardia-nonsustained  Hypertension  Grief discussion    Device function normal  VTNS  She is not vaccinated nor are her sons or grandchildren     Follow-up:  38m    Current medicines are reviewed at length with the patient today.   The patient  does not have concerns regarding her medicines.  The following changes were made today:  none  Labs/ tests ordered today include:   No orders of the defined types were placed in this encounter.    Patient Risk:  after full review of this patients clinical status, I feel that they are at moderate  risk at this time.  Today, I have spent 10  minutes with the patient with telehealth technology discussing the above.  Signed, Virl Axe, MD  04/28/2020 4:59 PM     [place] [address] [phone] (office) [phone] (fax)

## 2020-05-30 DIAGNOSIS — E538 Deficiency of other specified B group vitamins: Secondary | ICD-10-CM | POA: Diagnosis not present

## 2020-06-28 ENCOUNTER — Ambulatory Visit (INDEPENDENT_AMBULATORY_CARE_PROVIDER_SITE_OTHER): Payer: Medicare Other

## 2020-06-28 DIAGNOSIS — I495 Sick sinus syndrome: Secondary | ICD-10-CM | POA: Diagnosis not present

## 2020-06-29 DIAGNOSIS — I129 Hypertensive chronic kidney disease with stage 1 through stage 4 chronic kidney disease, or unspecified chronic kidney disease: Secondary | ICD-10-CM | POA: Diagnosis not present

## 2020-06-29 DIAGNOSIS — Z23 Encounter for immunization: Secondary | ICD-10-CM | POA: Diagnosis not present

## 2020-06-29 DIAGNOSIS — N183 Chronic kidney disease, stage 3 unspecified: Secondary | ICD-10-CM | POA: Diagnosis not present

## 2020-06-30 LAB — CUP PACEART REMOTE DEVICE CHECK
Battery Remaining Longevity: 38 mo
Battery Voltage: 2.97 V
Brady Statistic AP VP Percent: 2.63 %
Brady Statistic AP VS Percent: 96.76 %
Brady Statistic AS VP Percent: 0 %
Brady Statistic AS VS Percent: 0.61 %
Brady Statistic RA Percent Paced: 98.45 %
Brady Statistic RV Percent Paced: 4.13 %
Date Time Interrogation Session: 20211202215508
Implantable Lead Implant Date: 20151014
Implantable Lead Implant Date: 20151014
Implantable Lead Location: 753859
Implantable Lead Location: 753860
Implantable Lead Model: 5076
Implantable Lead Model: 5076
Implantable Pulse Generator Implant Date: 20151014
Lead Channel Impedance Value: 361 Ohm
Lead Channel Impedance Value: 380 Ohm
Lead Channel Impedance Value: 456 Ohm
Lead Channel Impedance Value: 513 Ohm
Lead Channel Pacing Threshold Amplitude: 0.375 V
Lead Channel Pacing Threshold Amplitude: 0.625 V
Lead Channel Pacing Threshold Pulse Width: 0.4 ms
Lead Channel Pacing Threshold Pulse Width: 0.4 ms
Lead Channel Sensing Intrinsic Amplitude: 1.5 mV
Lead Channel Sensing Intrinsic Amplitude: 1.5 mV
Lead Channel Sensing Intrinsic Amplitude: 17.25 mV
Lead Channel Sensing Intrinsic Amplitude: 17.25 mV
Lead Channel Setting Pacing Amplitude: 2 V
Lead Channel Setting Pacing Amplitude: 2.5 V
Lead Channel Setting Pacing Pulse Width: 0.4 ms
Lead Channel Setting Sensing Sensitivity: 2 mV

## 2020-07-06 NOTE — Progress Notes (Signed)
Remote pacemaker transmission.   

## 2020-09-05 DIAGNOSIS — I129 Hypertensive chronic kidney disease with stage 1 through stage 4 chronic kidney disease, or unspecified chronic kidney disease: Secondary | ICD-10-CM | POA: Diagnosis not present

## 2020-09-05 DIAGNOSIS — R32 Unspecified urinary incontinence: Secondary | ICD-10-CM | POA: Diagnosis not present

## 2020-09-05 DIAGNOSIS — E538 Deficiency of other specified B group vitamins: Secondary | ICD-10-CM | POA: Diagnosis not present

## 2020-09-05 DIAGNOSIS — N183 Chronic kidney disease, stage 3 unspecified: Secondary | ICD-10-CM | POA: Diagnosis not present

## 2020-09-05 DIAGNOSIS — R269 Unspecified abnormalities of gait and mobility: Secondary | ICD-10-CM | POA: Diagnosis not present

## 2020-09-05 DIAGNOSIS — Z0000 Encounter for general adult medical examination without abnormal findings: Secondary | ICD-10-CM | POA: Diagnosis not present

## 2020-09-05 DIAGNOSIS — L821 Other seborrheic keratosis: Secondary | ICD-10-CM | POA: Diagnosis not present

## 2020-09-27 ENCOUNTER — Ambulatory Visit (INDEPENDENT_AMBULATORY_CARE_PROVIDER_SITE_OTHER): Payer: Medicare Other

## 2020-09-27 DIAGNOSIS — I429 Cardiomyopathy, unspecified: Secondary | ICD-10-CM | POA: Diagnosis not present

## 2020-09-28 ENCOUNTER — Telehealth: Payer: Self-pay

## 2020-09-28 NOTE — Telephone Encounter (Signed)
Spoke with patient to remind of missed remote transmission 

## 2020-09-29 LAB — CUP PACEART REMOTE DEVICE CHECK
Battery Remaining Longevity: 34 mo
Battery Voltage: 2.96 V
Brady Statistic AP VP Percent: 2.94 %
Brady Statistic AP VS Percent: 96.51 %
Brady Statistic AS VP Percent: 0.01 %
Brady Statistic AS VS Percent: 0.54 %
Brady Statistic RA Percent Paced: 98.14 %
Brady Statistic RV Percent Paced: 4.42 %
Date Time Interrogation Session: 20220303145958
Implantable Lead Implant Date: 20151014
Implantable Lead Implant Date: 20151014
Implantable Lead Location: 753859
Implantable Lead Location: 753860
Implantable Lead Model: 5076
Implantable Lead Model: 5076
Implantable Pulse Generator Implant Date: 20151014
Lead Channel Impedance Value: 342 Ohm
Lead Channel Impedance Value: 380 Ohm
Lead Channel Impedance Value: 456 Ohm
Lead Channel Impedance Value: 513 Ohm
Lead Channel Pacing Threshold Amplitude: 0.375 V
Lead Channel Pacing Threshold Amplitude: 0.5 V
Lead Channel Pacing Threshold Pulse Width: 0.4 ms
Lead Channel Pacing Threshold Pulse Width: 0.4 ms
Lead Channel Sensing Intrinsic Amplitude: 0.5 mV
Lead Channel Sensing Intrinsic Amplitude: 0.5 mV
Lead Channel Sensing Intrinsic Amplitude: 21 mV
Lead Channel Sensing Intrinsic Amplitude: 21 mV
Lead Channel Setting Pacing Amplitude: 2 V
Lead Channel Setting Pacing Amplitude: 2.5 V
Lead Channel Setting Pacing Pulse Width: 0.4 ms
Lead Channel Setting Sensing Sensitivity: 2 mV

## 2020-10-05 NOTE — Progress Notes (Signed)
Remote pacemaker transmission.   

## 2020-12-27 ENCOUNTER — Ambulatory Visit (INDEPENDENT_AMBULATORY_CARE_PROVIDER_SITE_OTHER): Payer: Medicare Other

## 2020-12-27 DIAGNOSIS — I495 Sick sinus syndrome: Secondary | ICD-10-CM

## 2020-12-28 LAB — CUP PACEART REMOTE DEVICE CHECK
Battery Remaining Longevity: 32 mo
Battery Voltage: 2.95 V
Brady Statistic AP VP Percent: 2.39 %
Brady Statistic AP VS Percent: 97.34 %
Brady Statistic AS VP Percent: 0 %
Brady Statistic AS VS Percent: 0.27 %
Brady Statistic RA Percent Paced: 99.16 %
Brady Statistic RV Percent Paced: 3.65 %
Date Time Interrogation Session: 20220602151814
Implantable Lead Implant Date: 20151014
Implantable Lead Implant Date: 20151014
Implantable Lead Location: 753859
Implantable Lead Location: 753860
Implantable Lead Model: 5076
Implantable Lead Model: 5076
Implantable Pulse Generator Implant Date: 20151014
Lead Channel Impedance Value: 342 Ohm
Lead Channel Impedance Value: 361 Ohm
Lead Channel Impedance Value: 437 Ohm
Lead Channel Impedance Value: 494 Ohm
Lead Channel Pacing Threshold Amplitude: 0.375 V
Lead Channel Pacing Threshold Amplitude: 0.625 V
Lead Channel Pacing Threshold Pulse Width: 0.4 ms
Lead Channel Pacing Threshold Pulse Width: 0.4 ms
Lead Channel Sensing Intrinsic Amplitude: 1.25 mV
Lead Channel Sensing Intrinsic Amplitude: 1.25 mV
Lead Channel Sensing Intrinsic Amplitude: 18 mV
Lead Channel Sensing Intrinsic Amplitude: 18 mV
Lead Channel Setting Pacing Amplitude: 2 V
Lead Channel Setting Pacing Amplitude: 2.5 V
Lead Channel Setting Pacing Pulse Width: 0.4 ms
Lead Channel Setting Sensing Sensitivity: 2 mV

## 2021-01-09 DIAGNOSIS — I129 Hypertensive chronic kidney disease with stage 1 through stage 4 chronic kidney disease, or unspecified chronic kidney disease: Secondary | ICD-10-CM | POA: Diagnosis not present

## 2021-01-09 DIAGNOSIS — Z0000 Encounter for general adult medical examination without abnormal findings: Secondary | ICD-10-CM | POA: Diagnosis not present

## 2021-01-09 DIAGNOSIS — E7800 Pure hypercholesterolemia, unspecified: Secondary | ICD-10-CM | POA: Diagnosis not present

## 2021-01-09 DIAGNOSIS — Z1389 Encounter for screening for other disorder: Secondary | ICD-10-CM | POA: Diagnosis not present

## 2021-01-09 DIAGNOSIS — Z79899 Other long term (current) drug therapy: Secondary | ICD-10-CM | POA: Diagnosis not present

## 2021-01-09 DIAGNOSIS — E039 Hypothyroidism, unspecified: Secondary | ICD-10-CM | POA: Diagnosis not present

## 2021-01-09 DIAGNOSIS — I340 Nonrheumatic mitral (valve) insufficiency: Secondary | ICD-10-CM | POA: Diagnosis not present

## 2021-01-09 DIAGNOSIS — R300 Dysuria: Secondary | ICD-10-CM | POA: Diagnosis not present

## 2021-01-09 DIAGNOSIS — N183 Chronic kidney disease, stage 3 unspecified: Secondary | ICD-10-CM | POA: Diagnosis not present

## 2021-01-09 DIAGNOSIS — E538 Deficiency of other specified B group vitamins: Secondary | ICD-10-CM | POA: Diagnosis not present

## 2021-01-19 NOTE — Progress Notes (Signed)
Remote pacemaker transmission.   

## 2021-02-13 DIAGNOSIS — I129 Hypertensive chronic kidney disease with stage 1 through stage 4 chronic kidney disease, or unspecified chronic kidney disease: Secondary | ICD-10-CM | POA: Diagnosis not present

## 2021-02-13 DIAGNOSIS — N183 Chronic kidney disease, stage 3 unspecified: Secondary | ICD-10-CM | POA: Diagnosis not present

## 2021-02-13 DIAGNOSIS — E539 Vitamin B deficiency, unspecified: Secondary | ICD-10-CM | POA: Diagnosis not present

## 2021-02-13 DIAGNOSIS — R600 Localized edema: Secondary | ICD-10-CM | POA: Diagnosis not present

## 2021-03-22 DIAGNOSIS — E539 Vitamin B deficiency, unspecified: Secondary | ICD-10-CM | POA: Diagnosis not present

## 2021-03-28 ENCOUNTER — Ambulatory Visit (INDEPENDENT_AMBULATORY_CARE_PROVIDER_SITE_OTHER): Payer: Medicare Other

## 2021-03-28 DIAGNOSIS — I495 Sick sinus syndrome: Secondary | ICD-10-CM | POA: Diagnosis not present

## 2021-04-03 LAB — CUP PACEART REMOTE DEVICE CHECK
Battery Remaining Longevity: 29 mo
Battery Voltage: 2.95 V
Brady Statistic AP VP Percent: 2.13 %
Brady Statistic AP VS Percent: 97.53 %
Brady Statistic AS VP Percent: 0 %
Brady Statistic AS VS Percent: 0.34 %
Brady Statistic RA Percent Paced: 98.71 %
Brady Statistic RV Percent Paced: 3.21 %
Date Time Interrogation Session: 20220901205101
Implantable Lead Implant Date: 20151014
Implantable Lead Implant Date: 20151014
Implantable Lead Location: 753859
Implantable Lead Location: 753860
Implantable Lead Model: 5076
Implantable Lead Model: 5076
Implantable Pulse Generator Implant Date: 20151014
Lead Channel Impedance Value: 380 Ohm
Lead Channel Impedance Value: 399 Ohm
Lead Channel Impedance Value: 456 Ohm
Lead Channel Impedance Value: 513 Ohm
Lead Channel Pacing Threshold Amplitude: 0.375 V
Lead Channel Pacing Threshold Amplitude: 0.625 V
Lead Channel Pacing Threshold Pulse Width: 0.4 ms
Lead Channel Pacing Threshold Pulse Width: 0.4 ms
Lead Channel Sensing Intrinsic Amplitude: 1.75 mV
Lead Channel Sensing Intrinsic Amplitude: 1.75 mV
Lead Channel Sensing Intrinsic Amplitude: 24.875 mV
Lead Channel Sensing Intrinsic Amplitude: 24.875 mV
Lead Channel Setting Pacing Amplitude: 2 V
Lead Channel Setting Pacing Amplitude: 2.5 V
Lead Channel Setting Pacing Pulse Width: 0.4 ms
Lead Channel Setting Sensing Sensitivity: 2 mV

## 2021-04-10 NOTE — Progress Notes (Signed)
Remote pacemaker transmission.   

## 2021-05-04 DIAGNOSIS — R42 Dizziness and giddiness: Secondary | ICD-10-CM | POA: Diagnosis not present

## 2021-05-04 DIAGNOSIS — E539 Vitamin B deficiency, unspecified: Secondary | ICD-10-CM | POA: Diagnosis not present

## 2021-05-04 DIAGNOSIS — R634 Abnormal weight loss: Secondary | ICD-10-CM | POA: Diagnosis not present

## 2021-05-04 DIAGNOSIS — Z950 Presence of cardiac pacemaker: Secondary | ICD-10-CM | POA: Diagnosis not present

## 2021-06-05 DIAGNOSIS — E539 Vitamin B deficiency, unspecified: Secondary | ICD-10-CM | POA: Diagnosis not present

## 2021-06-27 ENCOUNTER — Ambulatory Visit (INDEPENDENT_AMBULATORY_CARE_PROVIDER_SITE_OTHER): Payer: Medicare Other

## 2021-06-27 DIAGNOSIS — I495 Sick sinus syndrome: Secondary | ICD-10-CM

## 2021-06-28 LAB — CUP PACEART REMOTE DEVICE CHECK
Battery Remaining Longevity: 26 mo
Battery Voltage: 2.94 V
Brady Statistic AP VP Percent: 2.55 %
Brady Statistic AP VS Percent: 97.28 %
Brady Statistic AS VP Percent: 0 %
Brady Statistic AS VS Percent: 0.17 %
Brady Statistic RA Percent Paced: 99.46 %
Brady Statistic RV Percent Paced: 3.11 %
Date Time Interrogation Session: 20221201124545
Implantable Lead Implant Date: 20151014
Implantable Lead Implant Date: 20151014
Implantable Lead Location: 753859
Implantable Lead Location: 753860
Implantable Lead Model: 5076
Implantable Lead Model: 5076
Implantable Pulse Generator Implant Date: 20151014
Lead Channel Impedance Value: 342 Ohm
Lead Channel Impedance Value: 361 Ohm
Lead Channel Impedance Value: 456 Ohm
Lead Channel Impedance Value: 513 Ohm
Lead Channel Pacing Threshold Amplitude: 0.375 V
Lead Channel Pacing Threshold Amplitude: 0.625 V
Lead Channel Pacing Threshold Pulse Width: 0.4 ms
Lead Channel Pacing Threshold Pulse Width: 0.4 ms
Lead Channel Sensing Intrinsic Amplitude: 1.125 mV
Lead Channel Sensing Intrinsic Amplitude: 1.125 mV
Lead Channel Sensing Intrinsic Amplitude: 19.625 mV
Lead Channel Sensing Intrinsic Amplitude: 19.625 mV
Lead Channel Setting Pacing Amplitude: 2 V
Lead Channel Setting Pacing Amplitude: 2.5 V
Lead Channel Setting Pacing Pulse Width: 0.4 ms
Lead Channel Setting Sensing Sensitivity: 2 mV

## 2021-07-06 DIAGNOSIS — E538 Deficiency of other specified B group vitamins: Secondary | ICD-10-CM | POA: Diagnosis not present

## 2021-07-06 NOTE — Progress Notes (Signed)
Remote pacemaker transmission.   

## 2021-07-29 HISTORY — PX: OTHER SURGICAL HISTORY: SHX169

## 2021-08-03 DIAGNOSIS — N183 Chronic kidney disease, stage 3 unspecified: Secondary | ICD-10-CM | POA: Diagnosis not present

## 2021-08-03 DIAGNOSIS — I129 Hypertensive chronic kidney disease with stage 1 through stage 4 chronic kidney disease, or unspecified chronic kidney disease: Secondary | ICD-10-CM | POA: Diagnosis not present

## 2021-08-24 DIAGNOSIS — E538 Deficiency of other specified B group vitamins: Secondary | ICD-10-CM | POA: Diagnosis not present

## 2021-08-24 DIAGNOSIS — J3489 Other specified disorders of nose and nasal sinuses: Secondary | ICD-10-CM | POA: Diagnosis not present

## 2021-08-24 DIAGNOSIS — M7989 Other specified soft tissue disorders: Secondary | ICD-10-CM | POA: Diagnosis not present

## 2021-09-26 ENCOUNTER — Ambulatory Visit (INDEPENDENT_AMBULATORY_CARE_PROVIDER_SITE_OTHER): Payer: Medicare Other

## 2021-09-26 DIAGNOSIS — E538 Deficiency of other specified B group vitamins: Secondary | ICD-10-CM | POA: Diagnosis not present

## 2021-09-26 DIAGNOSIS — I82C12 Acute embolism and thrombosis of left internal jugular vein: Secondary | ICD-10-CM | POA: Diagnosis not present

## 2021-09-26 DIAGNOSIS — M7989 Other specified soft tissue disorders: Secondary | ICD-10-CM | POA: Diagnosis not present

## 2021-09-26 DIAGNOSIS — I495 Sick sinus syndrome: Secondary | ICD-10-CM | POA: Diagnosis not present

## 2021-09-27 LAB — CUP PACEART REMOTE DEVICE CHECK
Battery Remaining Longevity: 24 mo
Battery Voltage: 2.93 V
Brady Statistic AP VP Percent: 1.64 %
Brady Statistic AP VS Percent: 98.2 %
Brady Statistic AS VP Percent: 0 %
Brady Statistic AS VS Percent: 0.16 %
Brady Statistic RA Percent Paced: 99.52 %
Brady Statistic RV Percent Paced: 2.03 %
Date Time Interrogation Session: 20230302093432
Implantable Lead Implant Date: 20151014
Implantable Lead Implant Date: 20151014
Implantable Lead Location: 753859
Implantable Lead Location: 753860
Implantable Lead Model: 5076
Implantable Lead Model: 5076
Implantable Pulse Generator Implant Date: 20151014
Lead Channel Impedance Value: 342 Ohm
Lead Channel Impedance Value: 380 Ohm
Lead Channel Impedance Value: 437 Ohm
Lead Channel Impedance Value: 494 Ohm
Lead Channel Pacing Threshold Amplitude: 0.375 V
Lead Channel Pacing Threshold Amplitude: 0.75 V
Lead Channel Pacing Threshold Pulse Width: 0.4 ms
Lead Channel Pacing Threshold Pulse Width: 0.4 ms
Lead Channel Sensing Intrinsic Amplitude: 0.25 mV
Lead Channel Sensing Intrinsic Amplitude: 0.25 mV
Lead Channel Sensing Intrinsic Amplitude: 18.375 mV
Lead Channel Sensing Intrinsic Amplitude: 18.375 mV
Lead Channel Setting Pacing Amplitude: 2 V
Lead Channel Setting Pacing Amplitude: 2.5 V
Lead Channel Setting Pacing Pulse Width: 0.4 ms
Lead Channel Setting Sensing Sensitivity: 2 mV

## 2021-10-01 DIAGNOSIS — Z85828 Personal history of other malignant neoplasm of skin: Secondary | ICD-10-CM | POA: Diagnosis not present

## 2021-10-01 DIAGNOSIS — L570 Actinic keratosis: Secondary | ICD-10-CM | POA: Diagnosis not present

## 2021-10-01 DIAGNOSIS — D492 Neoplasm of unspecified behavior of bone, soft tissue, and skin: Secondary | ICD-10-CM | POA: Diagnosis not present

## 2021-10-01 DIAGNOSIS — Z08 Encounter for follow-up examination after completed treatment for malignant neoplasm: Secondary | ICD-10-CM | POA: Diagnosis not present

## 2021-10-01 DIAGNOSIS — I788 Other diseases of capillaries: Secondary | ICD-10-CM | POA: Diagnosis not present

## 2021-10-01 DIAGNOSIS — C44311 Basal cell carcinoma of skin of nose: Secondary | ICD-10-CM | POA: Diagnosis not present

## 2021-10-01 DIAGNOSIS — L821 Other seborrheic keratosis: Secondary | ICD-10-CM | POA: Diagnosis not present

## 2021-10-03 NOTE — Progress Notes (Signed)
Remote pacemaker transmission.   

## 2021-10-31 DIAGNOSIS — I129 Hypertensive chronic kidney disease with stage 1 through stage 4 chronic kidney disease, or unspecified chronic kidney disease: Secondary | ICD-10-CM | POA: Diagnosis not present

## 2021-10-31 DIAGNOSIS — E538 Deficiency of other specified B group vitamins: Secondary | ICD-10-CM | POA: Diagnosis not present

## 2021-10-31 DIAGNOSIS — N183 Chronic kidney disease, stage 3 unspecified: Secondary | ICD-10-CM | POA: Diagnosis not present

## 2021-10-31 DIAGNOSIS — I82A12 Acute embolism and thrombosis of left axillary vein: Secondary | ICD-10-CM | POA: Diagnosis not present

## 2021-11-06 DIAGNOSIS — C44311 Basal cell carcinoma of skin of nose: Secondary | ICD-10-CM | POA: Diagnosis not present

## 2021-11-06 DIAGNOSIS — C4491 Basal cell carcinoma of skin, unspecified: Secondary | ICD-10-CM | POA: Diagnosis not present

## 2021-11-06 DIAGNOSIS — Z481 Encounter for planned postprocedural wound closure: Secondary | ICD-10-CM | POA: Diagnosis not present

## 2021-11-06 DIAGNOSIS — L570 Actinic keratosis: Secondary | ICD-10-CM | POA: Diagnosis not present

## 2021-11-06 DIAGNOSIS — L578 Other skin changes due to chronic exposure to nonionizing radiation: Secondary | ICD-10-CM | POA: Diagnosis not present

## 2021-11-29 ENCOUNTER — Encounter: Payer: Self-pay | Admitting: Vascular Surgery

## 2021-11-29 ENCOUNTER — Ambulatory Visit (INDEPENDENT_AMBULATORY_CARE_PROVIDER_SITE_OTHER): Payer: Medicare Other | Admitting: Vascular Surgery

## 2021-11-29 VITALS — BP 133/78 | HR 80 | Temp 98.4°F | Resp 20 | Ht 64.0 in | Wt 128.0 lb

## 2021-11-29 DIAGNOSIS — I82C12 Acute embolism and thrombosis of left internal jugular vein: Secondary | ICD-10-CM | POA: Diagnosis not present

## 2021-11-29 NOTE — Progress Notes (Signed)
? ?ASSESSMENT & PLAN  ? ?DVT LEFT INTERNAL JUGULAR VEIN: This patient developed a left IJ DVT that was diagnosed on 09/26/2021 by duplex.  She then on Eliquis now for 2 months.  I would recommend 1 more month of Eliquis to complete a 65-monthcourse.  I do not think she needs to be on lifelong anticoagulation given her history.  There would be some risk with this.  She will be at increased risk for DVTs in the future so if she develops significant increasing swelling in her arm she would have to have a follow-up duplex.  She does have a pacemaker on the left and certainly this could narrow the subclavian vein and brachiocephalic vein which could put her at higher risk for left IJ DVT.  I have discussed the importance of arm elevation.  She could also get fitted for a upper extremity compression sleeve if the swelling became more problematic.  I will see her back as needed ? ?REASON FOR CONSULT:   ? ?Left IJ DVT.  The consult is requested by Dr. HLajean Manes  ? ?HPI:  ? ?Michelle LEPAGEis a 88y.o. female who is referred with a left IJ DVT.  I did review the records from the referring office.  The patient had been sent for a venous duplex scan of the left upper extremity.  She had reportedly had chronic left arm swelling for almost a year.  This showed a left internal jugular vein thrombus.  The age was indeterminate.  There was no evidence of thrombus in the subclavian vein or axillary vein.  There was no evidence of superficial venous thrombus ? ?The patient did have a pacemaker placed in 2015 on the left side.  She did not have any significant problems with swelling after that.  About 3 months ago she began having increasing swelling in the left arm.  Of note she did get bitten by her cat which may have contributed some and she was actually on amoxicillin for that.  She was having persistent swelling which prompted a venous duplex scan which was done on 09/26/2021 which showed evidence of a DVT in the left IJ.   For this reason she was sent for vascular consultation. ? ?She has no previous history of DVT.  She denies any chest pain or shortness of breath. ? ?Past Medical History:  ?Diagnosis Date  ? Arthritis   ? "hands, fingers, toes" (05/11/2014)  ? Bradycardia   ? CAD (coronary artery disease)   ? a. cath 07/01/13: LM no obs dz, LAD widely patent no obs dz, LCx normal in appearance, RCA  small, nondominant vessel. Catheter-induced spasm at the ostium, EF 50-55%  ? Cancer ([facility]   ? Cardiomyopathy ([facility]   ? a. Echo 10/15: Inferior and inferolateral AK,  Anterolateral HK, EF 40%;  b.  Echo (1/16):  EF 55-60%, Gr 1 DD, PASP 27, mild to mod TR, trivial AI, mild MR  ? Chronic kidney disease (CKD), stage III (moderate) (HCC)   ? Cystitis 03/29/1949  ? "tube to bladder was in a kink & I had polyps; surgically corrected"  ? [place]   ? Heart murmur   ? "slight"  ? Heart palpitations   ? a. paroxysmal SVT, PVCs, NSVT. b. echo 03/27/14: EF 586-76% nl systolic function, AK of basalinferior myocardium, possible HK of the apicallateral and inferolateral myocardium, Ao trivial regurg, mild MR, mild LAE, trivial pulmonic regurg  ? Hypertension   ?  Hypothyroidism   ? Osteopenia 09/27/2015  ? T score -2.4 FRAX 17%/6.5%  ? Pacemaker   ? a.  Medtronic MRI compatible ADVISA pulse generator serial number [id number].  ? Thyroid nodule   ? Wide-complex tachycardia 03/25/2014  ? ? ?Family History  ?Problem Relation Age of Onset  ? Multiple sclerosis Mother   ? Heart attack Father   ? Cancer Sister   ?     Skin cancer  ? Diabetes Sister   ? Heart disease Brother   ? Cancer Brother   ?     Prostate  ? Leukemia Maternal Grandmother   ? Nephritis Paternal Grandmother   ? ? ?SOCIAL HISTORY: ?Social History  ? ?Tobacco Use  ? Smoking status: Never  ? Smokeless tobacco: Never  ?Substance Use Topics  ? Alcohol use: No  ?  Alcohol/week: 0.0 standard drinks  ? ? ?Allergies  ?Allergen Reactions  ? Lisinopril Other (See Comments)  ?   Chest tightness  ? Simvastatin Other (See Comments)  ?  Muscle pain  ? Welchol [Colesevelam Hcl] Other (See Comments)  ?  Muscle pain ?  ? Amiodarone Other (See Comments)  ?  Lowered heart rate ?  ? Ciprofloxacin Nausea Only  ? Zetia [Ezetimibe] Nausea Only  ? ? ?Current Outpatient Medications  ?Medication Sig Dispense Refill  ? chlorthalidone (HYGROTON) 25 MG tablet Take 12.5 mg by mouth every morning.    ? Cyanocobalamin (VITAMIN B-12 IJ) Inject 1 Dose as directed every 30 (thirty) days.    ? ELIQUIS 5 MG TABS tablet Take 5 mg by mouth 2 (two) times daily.    ? hydrochlorothiazide (HYDRODIURIL) 25 MG tablet Take 12.5 mg by mouth daily.     ? levothyroxine (SYNTHROID, LEVOTHROID) 125 MCG tablet Take 125 mcg by mouth daily.  3  ? loratadine (CLARITIN) 10 MG tablet Take 10 mg by mouth daily as needed for allergies.     ? losartan (COZAAR) 50 MG tablet Take 1 tablet by mouth daily.    ? metoprolol succinate (TOPROL-XL) 50 MG 24 hr tablet Take 50 mg by mouth daily.    ? sodium chloride (OCEAN) 0.65 % SOLN nasal spray Place 1 spray into both nostrils as needed for congestion.    ? telmisartan (MICARDIS) 40 MG tablet Take 40 mg by mouth daily.    ? ?No current facility-administered medications for this visit.  ? ? ?REVIEW OF SYSTEMS:  ?'[X]'$  denotes positive finding, '[ ]'$  denotes negative finding ?Cardiac  Comments:  ?Chest pain or chest pressure:    ?Shortness of breath upon exertion:    ?Short of breath when lying flat:    ?Irregular heart rhythm:    ?    ?Vascular    ?Pain in calf, thigh, or hip brought on by ambulation:    ?Pain in feet at night that wakes you up from your sleep:     ?Blood clot in your veins:    ?Leg swelling:     ?Arm swelling x left  ?Pulmonary    ?Oxygen at home:    ?Productive cough:     ?Wheezing:     ?    ?Neurologic    ?Sudden weakness in arms or legs:     ?Sudden numbness in arms or legs:     ?Sudden onset of difficulty speaking or slurred speech:    ?Temporary loss of vision in one eye:      ?Problems with dizziness:     ?    ?Gastrointestinal    ?  Blood in stool:     ?Vomited blood:     ?    ?Genitourinary    ?Burning when urinating:     ?Blood in urine:    ?    ?Psychiatric    ?Major depression:     ?    ?Hematologic    ?Bleeding problems:    ?Problems with blood clotting too easily:    ?    ?Skin    ?Rashes or ulcers:    ?    ?Constitutional    ?Fever or chills:    ?- ? ?PHYSICAL EXAM:  ? ?Vitals:  ? 11/29/21 1238  ?BP: 133/78  ?Pulse: 80  ?Resp: 20  ?Temp: 98.4 ?F (36.9 ?C)  ?SpO2: 98%  ?Weight: 128 lb (58.1 kg)  ?Height: '5\' 4"'$  (1.626 m)  ? ?Body mass index is 21.97 kg/m?. ?GENERAL: The patient is a well-nourished female, in no acute distress. The vital signs are documented above. ?CARDIAC: There is a regular rate and rhythm.  ?VASCULAR: I do not detect carotid bruits. ?She has palpable femoral pulses and warm well-perfused feet. ?She has palpable radial pulses. ?He has moderate swelling of her left upper extremity. ?PULMONARY: There is good air exchange bilaterally without wheezing or rales. ?ABDOMEN: Soft and non-tender with normal pitched bowel sounds.  ?MUSCULOSKELETAL: There are no major deformities. ?NEUROLOGIC: No focal weakness or paresthesias are detected. ?SKIN: There are no ulcers or rashes noted. ?PSYCHIATRIC: The patient has a normal affect. ? ?DATA:   ? ?VENOUS DUPLEX: I reviewed the venous duplex scan that was done on 09/26/2021.  This showed a left IJ DVT.  There was no clot in the axillary or subclavian veins.  There is no clot in the brachial veins.  There was no superficial thrombophlebitis. ? ?Deitra Mayo ?[facility] ?

## 2021-12-06 DIAGNOSIS — E538 Deficiency of other specified B group vitamins: Secondary | ICD-10-CM | POA: Diagnosis not present

## 2021-12-11 DIAGNOSIS — L905 Scar conditions and fibrosis of skin: Secondary | ICD-10-CM | POA: Diagnosis not present

## 2021-12-11 DIAGNOSIS — L570 Actinic keratosis: Secondary | ICD-10-CM | POA: Diagnosis not present

## 2021-12-11 DIAGNOSIS — L578 Other skin changes due to chronic exposure to nonionizing radiation: Secondary | ICD-10-CM | POA: Diagnosis not present

## 2021-12-26 ENCOUNTER — Ambulatory Visit (INDEPENDENT_AMBULATORY_CARE_PROVIDER_SITE_OTHER): Payer: Medicare Other

## 2021-12-26 DIAGNOSIS — I495 Sick sinus syndrome: Secondary | ICD-10-CM

## 2021-12-27 LAB — CUP PACEART REMOTE DEVICE CHECK
Battery Remaining Longevity: 23 mo
Battery Voltage: 2.92 V
Brady Statistic AP VP Percent: 0.75 %
Brady Statistic AP VS Percent: 99.02 %
Brady Statistic AS VP Percent: 0 %
Brady Statistic AS VS Percent: 0.23 %
Brady Statistic RA Percent Paced: 99.54 %
Brady Statistic RV Percent Paced: 1 %
Date Time Interrogation Session: 20230601004400
Implantable Lead Implant Date: 20151014
Implantable Lead Implant Date: 20151014
Implantable Lead Location: 753859
Implantable Lead Location: 753860
Implantable Lead Model: 5076
Implantable Lead Model: 5076
Implantable Pulse Generator Implant Date: 20151014
Lead Channel Impedance Value: 361 Ohm
Lead Channel Impedance Value: 399 Ohm
Lead Channel Impedance Value: 494 Ohm
Lead Channel Impedance Value: 551 Ohm
Lead Channel Pacing Threshold Amplitude: 0.375 V
Lead Channel Pacing Threshold Amplitude: 0.625 V
Lead Channel Pacing Threshold Pulse Width: 0.4 ms
Lead Channel Pacing Threshold Pulse Width: 0.4 ms
Lead Channel Sensing Intrinsic Amplitude: 0.75 mV
Lead Channel Sensing Intrinsic Amplitude: 0.75 mV
Lead Channel Sensing Intrinsic Amplitude: 22.5 mV
Lead Channel Sensing Intrinsic Amplitude: 22.5 mV
Lead Channel Setting Pacing Amplitude: 2 V
Lead Channel Setting Pacing Amplitude: 2.5 V
Lead Channel Setting Pacing Pulse Width: 0.4 ms
Lead Channel Setting Sensing Sensitivity: 2 mV

## 2022-01-09 NOTE — Progress Notes (Signed)
Remote pacemaker transmission.   

## 2022-01-30 DIAGNOSIS — E7800 Pure hypercholesterolemia, unspecified: Secondary | ICD-10-CM | POA: Diagnosis not present

## 2022-01-30 DIAGNOSIS — I340 Nonrheumatic mitral (valve) insufficiency: Secondary | ICD-10-CM | POA: Diagnosis not present

## 2022-01-30 DIAGNOSIS — I129 Hypertensive chronic kidney disease with stage 1 through stage 4 chronic kidney disease, or unspecified chronic kidney disease: Secondary | ICD-10-CM | POA: Diagnosis not present

## 2022-01-30 DIAGNOSIS — H6120 Impacted cerumen, unspecified ear: Secondary | ICD-10-CM | POA: Diagnosis not present

## 2022-01-30 DIAGNOSIS — E039 Hypothyroidism, unspecified: Secondary | ICD-10-CM | POA: Diagnosis not present

## 2022-01-30 DIAGNOSIS — E538 Deficiency of other specified B group vitamins: Secondary | ICD-10-CM | POA: Diagnosis not present

## 2022-01-30 DIAGNOSIS — Z950 Presence of cardiac pacemaker: Secondary | ICD-10-CM | POA: Diagnosis not present

## 2022-01-30 DIAGNOSIS — N183 Chronic kidney disease, stage 3 unspecified: Secondary | ICD-10-CM | POA: Diagnosis not present

## 2022-01-30 DIAGNOSIS — Z79899 Other long term (current) drug therapy: Secondary | ICD-10-CM | POA: Diagnosis not present

## 2022-01-30 DIAGNOSIS — Z0000 Encounter for general adult medical examination without abnormal findings: Secondary | ICD-10-CM | POA: Diagnosis not present

## 2022-02-14 DIAGNOSIS — Z85828 Personal history of other malignant neoplasm of skin: Secondary | ICD-10-CM | POA: Diagnosis not present

## 2022-02-14 DIAGNOSIS — L570 Actinic keratosis: Secondary | ICD-10-CM | POA: Diagnosis not present

## 2022-02-14 DIAGNOSIS — T07XXXA Unspecified multiple injuries, initial encounter: Secondary | ICD-10-CM | POA: Diagnosis not present

## 2022-02-14 DIAGNOSIS — D492 Neoplasm of unspecified behavior of bone, soft tissue, and skin: Secondary | ICD-10-CM | POA: Diagnosis not present

## 2022-02-14 DIAGNOSIS — L814 Other melanin hyperpigmentation: Secondary | ICD-10-CM | POA: Diagnosis not present

## 2022-02-14 DIAGNOSIS — L821 Other seborrheic keratosis: Secondary | ICD-10-CM | POA: Diagnosis not present

## 2022-02-14 DIAGNOSIS — D225 Melanocytic nevi of trunk: Secondary | ICD-10-CM | POA: Diagnosis not present

## 2022-02-14 DIAGNOSIS — Z08 Encounter for follow-up examination after completed treatment for malignant neoplasm: Secondary | ICD-10-CM | POA: Diagnosis not present

## 2022-03-04 DIAGNOSIS — R42 Dizziness and giddiness: Secondary | ICD-10-CM | POA: Diagnosis not present

## 2022-03-04 DIAGNOSIS — R531 Weakness: Secondary | ICD-10-CM | POA: Diagnosis not present

## 2022-03-04 DIAGNOSIS — L603 Nail dystrophy: Secondary | ICD-10-CM | POA: Diagnosis not present

## 2022-03-04 DIAGNOSIS — H6123 Impacted cerumen, bilateral: Secondary | ICD-10-CM | POA: Diagnosis not present

## 2022-03-07 ENCOUNTER — Telehealth: Payer: Self-pay | Admitting: Internal Medicine

## 2022-03-07 NOTE — Telephone Encounter (Signed)
Pt would like a callback regarding transmissions. Please advise

## 2022-03-07 NOTE — Telephone Encounter (Signed)
Patient did not receive letter on when to send her august date when to send transmission. Letter was sent per Epic.   Patient provided dates below when to send transmission.  -03/27/22 -06/26/22 -09/25/21

## 2022-03-20 DIAGNOSIS — E559 Vitamin D deficiency, unspecified: Secondary | ICD-10-CM | POA: Diagnosis not present

## 2022-03-27 ENCOUNTER — Ambulatory Visit (INDEPENDENT_AMBULATORY_CARE_PROVIDER_SITE_OTHER): Payer: Medicare Other

## 2022-03-27 DIAGNOSIS — I495 Sick sinus syndrome: Secondary | ICD-10-CM | POA: Diagnosis not present

## 2022-04-02 ENCOUNTER — Other Ambulatory Visit (HOSPITAL_COMMUNITY): Payer: Self-pay | Admitting: Internal Medicine

## 2022-04-02 ENCOUNTER — Ambulatory Visit (HOSPITAL_COMMUNITY)
Admission: RE | Admit: 2022-04-02 | Discharge: 2022-04-02 | Disposition: A | Discharge status: Home / Self Care | Payer: Medicare Other | Source: Ambulatory Visit | Attending: Internal Medicine | Admitting: Internal Medicine

## 2022-04-02 DIAGNOSIS — R609 Edema, unspecified: Secondary | ICD-10-CM | POA: Diagnosis not present

## 2022-04-02 DIAGNOSIS — M7989 Other specified soft tissue disorders: Secondary | ICD-10-CM | POA: Diagnosis not present

## 2022-04-02 LAB — CUP PACEART REMOTE DEVICE CHECK
Battery Remaining Longevity: 18 mo
Battery Voltage: 2.91 V
Brady Statistic AP VP Percent: 0.52 %
Brady Statistic AP VS Percent: 99.35 %
Brady Statistic AS VP Percent: 0 %
Brady Statistic AS VS Percent: 0.13 %
Brady Statistic RA Percent Paced: 99.68 %
Brady Statistic RV Percent Paced: 0.7 %
Date Time Interrogation Session: 20230831101231
Implantable Lead Implant Date: 20151014
Implantable Lead Implant Date: 20151014
Implantable Lead Location: 753859
Implantable Lead Location: 753860
Implantable Lead Model: 5076
Implantable Lead Model: 5076
Implantable Pulse Generator Implant Date: 20151014
Lead Channel Impedance Value: 342 Ohm
Lead Channel Impedance Value: 380 Ohm
Lead Channel Impedance Value: 456 Ohm
Lead Channel Impedance Value: 513 Ohm
Lead Channel Pacing Threshold Amplitude: 0.375 V
Lead Channel Pacing Threshold Amplitude: 0.75 V
Lead Channel Pacing Threshold Pulse Width: 0.4 ms
Lead Channel Pacing Threshold Pulse Width: 0.4 ms
Lead Channel Sensing Intrinsic Amplitude: 1.625 mV
Lead Channel Sensing Intrinsic Amplitude: 1.625 mV
Lead Channel Sensing Intrinsic Amplitude: 18.25 mV
Lead Channel Sensing Intrinsic Amplitude: 18.25 mV
Lead Channel Setting Pacing Amplitude: 2 V
Lead Channel Setting Pacing Amplitude: 2.5 V
Lead Channel Setting Pacing Pulse Width: 0.4 ms
Lead Channel Setting Sensing Sensitivity: 2 mV

## 2022-04-02 NOTE — Progress Notes (Signed)
Left upper extremity venous duplex completed. Refer to "CV Proc" under chart review to view preliminary results.  04/02/2022 2:49 PM Kelby Aline., MHA, RVT, RDCS, RDMS

## 2022-04-04 DIAGNOSIS — L03114 Cellulitis of left upper limb: Secondary | ICD-10-CM | POA: Diagnosis not present

## 2022-04-04 DIAGNOSIS — E538 Deficiency of other specified B group vitamins: Secondary | ICD-10-CM | POA: Diagnosis not present

## 2022-04-04 DIAGNOSIS — I951 Orthostatic hypotension: Secondary | ICD-10-CM | POA: Diagnosis not present

## 2022-04-19 NOTE — Progress Notes (Signed)
Remote pacemaker transmission.   

## 2022-04-22 ENCOUNTER — Encounter (HOSPITAL_BASED_OUTPATIENT_CLINIC_OR_DEPARTMENT_OTHER): Payer: Self-pay | Admitting: Emergency Medicine

## 2022-04-22 ENCOUNTER — Other Ambulatory Visit: Payer: Self-pay

## 2022-04-22 ENCOUNTER — Emergency Department (HOSPITAL_BASED_OUTPATIENT_CLINIC_OR_DEPARTMENT_OTHER)
Admission: EM | Admit: 2022-04-22 | Discharge: 2022-04-22 | Disposition: A | Discharge status: Home / Self Care | Payer: Medicare Other | Attending: Emergency Medicine | Admitting: Emergency Medicine

## 2022-04-22 ENCOUNTER — Emergency Department (HOSPITAL_BASED_OUTPATIENT_CLINIC_OR_DEPARTMENT_OTHER): Payer: Medicare Other

## 2022-04-22 DIAGNOSIS — R209 Unspecified disturbances of skin sensation: Secondary | ICD-10-CM | POA: Diagnosis not present

## 2022-04-22 DIAGNOSIS — Z79899 Other long term (current) drug therapy: Secondary | ICD-10-CM | POA: Insufficient documentation

## 2022-04-22 DIAGNOSIS — R202 Paresthesia of skin: Secondary | ICD-10-CM | POA: Diagnosis not present

## 2022-04-22 DIAGNOSIS — R7309 Other abnormal glucose: Secondary | ICD-10-CM | POA: Diagnosis not present

## 2022-04-22 DIAGNOSIS — R200 Anesthesia of skin: Secondary | ICD-10-CM | POA: Diagnosis not present

## 2022-04-22 DIAGNOSIS — Z7901 Long term (current) use of anticoagulants: Secondary | ICD-10-CM | POA: Diagnosis not present

## 2022-04-22 LAB — COMPREHENSIVE METABOLIC PANEL
ALT: 9 U/L (ref 0–44)
AST: 22 U/L (ref 15–41)
Albumin: 4.1 g/dL (ref 3.5–5.0)
Alkaline Phosphatase: 68 U/L (ref 38–126)
Anion gap: 11 (ref 5–15)
BUN: 17 mg/dL (ref 8–23)
CO2: 25 mmol/L (ref 22–32)
Calcium: 9.2 mg/dL (ref 8.9–10.3)
Chloride: 103 mmol/L (ref 98–111)
Creatinine, Ser: 1.04 mg/dL — ABNORMAL HIGH (ref 0.44–1.00)
GFR, Estimated: 52 mL/min — ABNORMAL LOW (ref >60)
Glucose, Bld: 107 mg/dL — ABNORMAL HIGH (ref 70–99)
Potassium: 4.3 mmol/L (ref 3.5–5.1)
Sodium: 139 mmol/L (ref 135–145)
Total Bilirubin: 0.5 mg/dL (ref 0.3–1.2)
Total Protein: 6.8 g/dL (ref 6.5–8.1)

## 2022-04-22 LAB — DIFFERENTIAL
Abs Immature Granulocytes: 0.03 10*3/uL (ref 0.00–0.07)
Basophils Absolute: 0.1 10*3/uL (ref 0.0–0.1)
Basophils Relative: 1 %
Eosinophils Absolute: 0.2 10*3/uL (ref 0.0–0.5)
Eosinophils Relative: 3 %
Immature Granulocytes: 1 %
Lymphocytes Relative: 21 %
Lymphs Abs: 1.1 10*3/uL (ref 0.7–4.0)
Monocytes Absolute: 0.4 10*3/uL (ref 0.1–1.0)
Monocytes Relative: 8 %
Neutro Abs: 3.5 10*3/uL (ref 1.7–7.7)
Neutrophils Relative %: 66 %

## 2022-04-22 LAB — CBC
HCT: 36.8 % (ref 36.0–46.0)
Hemoglobin: 12.6 g/dL (ref 12.0–15.0)
MCH: 31.3 pg (ref 26.0–34.0)
MCHC: 34.2 g/dL (ref 30.0–36.0)
MCV: 91.5 fL (ref 80.0–100.0)
Platelets: 154 10*3/uL (ref 150–400)
RBC: 4.02 MIL/uL (ref 3.87–5.11)
RDW: 12.8 % (ref 11.5–15.5)
WBC: 5.3 10*3/uL (ref 4.0–10.5)
nRBC: 0 % (ref 0.0–0.2)

## 2022-04-22 LAB — CBG MONITORING, ED: Glucose-Capillary: 100 mg/dL — ABNORMAL HIGH (ref 70–99)

## 2022-04-22 LAB — APTT: aPTT: 24 seconds (ref 24–36)

## 2022-04-22 LAB — ETHANOL: Alcohol, Ethyl (B): <10 mg/dL (ref <10)

## 2022-04-22 LAB — PROTIME-INR
INR: 1 (ref 0.8–1.2)
Prothrombin Time: 13.1 seconds (ref 11.4–15.2)

## 2022-04-22 NOTE — ED Provider Notes (Signed)
[facility]ERG[facility] DEPT Provider Note   CSN: [id number] Arrival date & time: 04/22/22  1724     History  No chief complaint on file.   Michelle Winters is a 88 y.o. female.  88 yo F with a cc left arm numbness.  This went from the elbow to the hand.  She has difficulty describing it.  It was something that she felt.  She had a headache yesterday that resolved.  A couple days ago she had right arm numbness.  Numbness lasted for couple minutes.  She denied any weakness.  Denies any neck pain denies trauma.  She also had some numbness that occurred around her mouth.  This is also resolved.  She denies difficulty with speech or swallowing.  Denies history of stroke.  Was recently taken off her diuretic.        Home Medications Prior to Admission medications   Medication Sig Start Date End Date Taking? Authorizing Provider  chlorthalidone (HYGROTON) 25 MG tablet Take 12.5 mg by mouth every morning. 11/25/21   [provider]  Cyanocobalamin (VITAMIN B-12 IJ) Inject 1 Dose as directed every 30 (thirty) days.    [provider]  ELIQUIS 5 MG TABS tablet Take 5 mg by mouth 2 (two) times daily. 11/04/21   [provider]  hydrochlorothiazide (HYDRODIURIL) 25 MG tablet Take 12.5 mg by mouth daily.     [provider]  levothyroxine (SYNTHROID, LEVOTHROID) 125 MCG tablet Take 125 mcg by mouth daily. 09/04/17   [provider]  loratadine (CLARITIN) 10 MG tablet Take 10 mg by mouth daily as needed for allergies.     [provider]  losartan (COZAAR) 50 MG tablet Take 1 tablet by mouth daily.    [provider]  metoprolol succinate (TOPROL-XL) 50 MG 24 hr tablet Take 50 mg by mouth daily. 11/24/21   [provider]  sodium chloride (OCEAN) 0.65 % SOLN nasal spray Place 1 spray into both nostrils as needed for congestion.    [provider]  telmisartan (MICARDIS) 40 MG tablet Take 40 mg by mouth  daily. 09/22/21   [provider]      Allergies    Lisinopril, Simvastatin, Welchol [colesevelam hcl], Amiodarone, Ciprofloxacin, and Zetia [ezetimibe]    Review of Systems   Review of Systems  Physical Exam Updated Vital Signs BP (!) 170/84   Pulse 75   Temp 98.3 F (36.8 C)   Resp 16   Ht '5\' 4"'$  (1.626 m)   Wt 57.6 kg   SpO2 99%   BMI 21.80 kg/m  Physical Exam Vitals and nursing note reviewed.  Constitutional:      General: She is not in acute distress.    Appearance: She is well-developed. She is not diaphoretic.  HENT:     Head: Normocephalic and atraumatic.  Eyes:     Pupils: Pupils are equal, round, and reactive to light.  Cardiovascular:     Rate and Rhythm: Normal rate and regular rhythm.     Heart sounds: No murmur heard.    No friction rub. No gallop.  Pulmonary:     Effort: Pulmonary effort is normal.     Breath sounds: No wheezing or rales.  Abdominal:     General: There is no distension.     Palpations: Abdomen is soft.     Tenderness: There is no abdominal tenderness.  Musculoskeletal:        General: No tenderness.  Cervical back: Normal range of motion and neck supple.  Skin:    General: Skin is warm and dry.  Neurological:     Mental Status: She is alert and oriented to person, place, and time.     Cranial Nerves: Cranial nerves 2-12 are intact.     Sensory: Sensation is intact.     Motor: Motor function is intact.     Coordination: Coordination is intact.     Comments: Pulse motor and sensation intact bilateral upper extremities.  Benign neurologic exam.  Psychiatric:        Behavior: Behavior normal.     ED Results / Procedures / Treatments   Labs (all labs ordered are listed, but only abnormal results are displayed) Labs Reviewed  COMPREHENSIVE METABOLIC PANEL - Abnormal; Notable for the following components:      Result Value   Glucose, Bld 107 (*)    Creatinine, Ser 1.04 (*)    GFR, Estimated 52 (*)    All other  components within normal limits  CBG MONITORING, ED - Abnormal; Notable for the following components:   Glucose-Capillary 100 (*)    All other components within normal limits  PROTIME-INR  APTT  CBC  DIFFERENTIAL  ETHANOL    EKG EKG Interpretation  Date/Time:  Monday April 22 2022 17:39:37 EDT Ventricular Rate:  73 PR Interval:  262 QRS Duration: 102 QT Interval:  394 QTC Calculation: 434 R Axis:   26 Text Interpretation: Atrial-paced rhythm with prolonged AV conduction with occasional AV dual-paced complexes Nonspecific T wave abnormality Abnormal ECG Premature ventricular complexes Otherwise no significant change Confirmed by Deno Etienne [phone]) on 04/22/2022 6:07:57 PM  Radiology CT HEAD WO CONTRAST  Result Date: 04/22/2022 CLINICAL DATA:  Numbness in the left arm and hand resolved EXAM: CT HEAD WITHOUT CONTRAST TECHNIQUE: Contiguous axial images were obtained from the base of the skull through the vertex without intravenous contrast. RADIATION DOSE REDUCTION: This exam was performed according to the departmental dose-optimization program which includes automated exposure control, adjustment of the mA and/or kV according to patient size and/or use of iterative reconstruction technique. COMPARISON:  None Available. FINDINGS: Brain: No acute territorial infarction, hemorrhage, or intracranial mass. Mild atrophy. Mild chronic small vessel ischemic changes of the white matter. Nonenlarged ventricles Vascular: No hyperdense vessels.  Carotid vascular calcification Skull: Normal. Negative for fracture or focal lesion. Sinuses/Orbits: No acute finding. Other: None IMPRESSION: 1. No CT evidence for acute intracranial abnormality. 2. Mild atrophy and chronic small vessel ischemic changes of the white matter Electronically Signed   By: Donavan Foil M.D.   On: 04/22/2022 19:12    Procedures Procedures    Medications Ordered in ED Medications - No data to display  ED Course/ Medical  Decision Making/ A&P                           Medical Decision Making Amount and/or Complexity of Data Reviewed Labs: ordered. Radiology: ordered.   88 yo F with a chief complaints of left arm numbness.  She has difficulty describing what this meant.  Was numb from the elbow down to the hand.  sensation was diffusely around the extremity.  Lasted for couple minutes and resolved.  She also had tingling around the mouth.  Has a benign exam now.  We will obtain a CT of the head.  Blood work.  CT of the head without obvious intracranial pathology.  No anemia no significant electrolyte  abnormalities.  I discussed results with patient.  Again discussed the rationale for obtaining an MRI to evaluate for an acute stroke.  Patient declining at this time.  Is scheduled follow-up with her PCP this week.  Plans on discussing the symptoms with them.  I offered for her to return anytime she changed her mind and certainly encouraged her to return for recurrent or persistent symptoms.  8:11 PM:  I have discussed the diagnosis/risks/treatment options with the patient and family.  Evaluation and diagnostic testing in the emergency department does not suggest an emergent condition requiring admission or immediate intervention beyond what has been performed at this time.  They will follow up with PCP. We also discussed returning to the ED immediately if new or worsening sx occur. We discussed the sx which are most concerning (e.g., sudden worsening pain, fever, inability to tolerate by mouth, stroke s/sx) that necessitate immediate return. Medications administered to the patient during their visit and any new prescriptions provided to the patient are listed below.  Medications given during this visit Medications - No data to display   The patient appears reasonably screen and/or stabilized for discharge and I doubt any other medical condition or other [facility] requiring further screening, evaluation, or treatment in  the ED at this time prior to discharge.          Final Clinical Impression(s) / ED Diagnoses Final diagnoses:  Left arm numbness    Rx / DC Orders ED Discharge Orders     None         Deno Etienne, DO 04/22/22 2011

## 2022-04-22 NOTE — ED Notes (Signed)
Patient transported to CT 

## 2022-04-22 NOTE — ED Triage Notes (Signed)
Numbness in left hand and left arm around 4:30 pm. Lasted a couple of minutes Now resolved. NIH 0 in triage

## 2022-04-22 NOTE — Discharge Instructions (Signed)
Please return at any point you would like to be reevaluated.  Follow up with your doctor in the office.  Return for recurrent symptoms.

## 2022-04-25 ENCOUNTER — Ambulatory Visit
Admission: RE | Admit: 2022-04-25 | Discharge: 2022-04-25 | Disposition: A | Discharge status: Home / Self Care | Payer: Medicare Other | Source: Ambulatory Visit | Attending: Physician Assistant | Admitting: Physician Assistant

## 2022-04-25 ENCOUNTER — Other Ambulatory Visit: Payer: Self-pay | Admitting: Physician Assistant

## 2022-04-25 DIAGNOSIS — M542 Cervicalgia: Secondary | ICD-10-CM

## 2022-04-25 DIAGNOSIS — E039 Hypothyroidism, unspecified: Secondary | ICD-10-CM | POA: Diagnosis not present

## 2022-04-25 DIAGNOSIS — R200 Anesthesia of skin: Secondary | ICD-10-CM

## 2022-04-25 DIAGNOSIS — G44209 Tension-type headache, unspecified, not intractable: Secondary | ICD-10-CM

## 2022-04-25 DIAGNOSIS — R519 Headache, unspecified: Secondary | ICD-10-CM | POA: Diagnosis not present

## 2022-04-30 DIAGNOSIS — H43813 Vitreous degeneration, bilateral: Secondary | ICD-10-CM | POA: Diagnosis not present

## 2022-04-30 DIAGNOSIS — Z961 Presence of intraocular lens: Secondary | ICD-10-CM | POA: Diagnosis not present

## 2022-05-08 DIAGNOSIS — M5412 Radiculopathy, cervical region: Secondary | ICD-10-CM | POA: Diagnosis not present

## 2022-05-08 DIAGNOSIS — I129 Hypertensive chronic kidney disease with stage 1 through stage 4 chronic kidney disease, or unspecified chronic kidney disease: Secondary | ICD-10-CM | POA: Diagnosis not present

## 2022-05-09 DIAGNOSIS — L570 Actinic keratosis: Secondary | ICD-10-CM | POA: Diagnosis not present

## 2022-05-09 DIAGNOSIS — Z85828 Personal history of other malignant neoplasm of skin: Secondary | ICD-10-CM | POA: Diagnosis not present

## 2022-05-09 DIAGNOSIS — M71341 Other bursal cyst, right hand: Secondary | ICD-10-CM | POA: Diagnosis not present

## 2022-05-09 DIAGNOSIS — D044 Carcinoma in situ of skin of scalp and neck: Secondary | ICD-10-CM | POA: Diagnosis not present

## 2022-05-09 DIAGNOSIS — Z08 Encounter for follow-up examination after completed treatment for malignant neoplasm: Secondary | ICD-10-CM | POA: Diagnosis not present

## 2022-05-09 DIAGNOSIS — C4442 Squamous cell carcinoma of skin of scalp and neck: Secondary | ICD-10-CM | POA: Diagnosis not present

## 2022-05-24 DIAGNOSIS — E538 Deficiency of other specified B group vitamins: Secondary | ICD-10-CM | POA: Diagnosis not present

## 2022-06-26 ENCOUNTER — Ambulatory Visit: Payer: Medicare Other

## 2022-06-26 DIAGNOSIS — I495 Sick sinus syndrome: Secondary | ICD-10-CM

## 2022-06-26 DIAGNOSIS — M6281 Muscle weakness (generalized): Secondary | ICD-10-CM | POA: Diagnosis not present

## 2022-06-26 DIAGNOSIS — R52 Pain, unspecified: Secondary | ICD-10-CM | POA: Diagnosis not present

## 2022-06-26 DIAGNOSIS — M546 Pain in thoracic spine: Secondary | ICD-10-CM | POA: Diagnosis not present

## 2022-06-26 LAB — CUP PACEART REMOTE DEVICE CHECK
Battery Remaining Longevity: 16 mo
Battery Voltage: 2.9 V
Brady Statistic AP VP Percent: 0.72 %
Brady Statistic AP VS Percent: 98.92 %
Brady Statistic AS VP Percent: 0 %
Brady Statistic AS VS Percent: 0.36 %
Brady Statistic RA Percent Paced: 99.38 %
Brady Statistic RV Percent Paced: 0.89 %
Date Time Interrogation Session: 20231129110933
Implantable Lead Connection Status: 753985
Implantable Lead Connection Status: 753985
Implantable Lead Implant Date: 20151014
Implantable Lead Implant Date: 20151014
Implantable Lead Location: 753859
Implantable Lead Location: 753860
Implantable Lead Model: 5076
Implantable Lead Model: 5076
Implantable Pulse Generator Implant Date: 20151014
Lead Channel Impedance Value: 361 Ohm
Lead Channel Impedance Value: 380 Ohm
Lead Channel Impedance Value: 437 Ohm
Lead Channel Impedance Value: 494 Ohm
Lead Channel Pacing Threshold Amplitude: 0.375 V
Lead Channel Pacing Threshold Amplitude: 0.75 V
Lead Channel Pacing Threshold Pulse Width: 0.4 ms
Lead Channel Pacing Threshold Pulse Width: 0.4 ms
Lead Channel Sensing Intrinsic Amplitude: 1 mV
Lead Channel Sensing Intrinsic Amplitude: 1 mV
Lead Channel Sensing Intrinsic Amplitude: 21 mV
Lead Channel Sensing Intrinsic Amplitude: 21 mV
Lead Channel Setting Pacing Amplitude: 2 V
Lead Channel Setting Pacing Amplitude: 2.5 V
Lead Channel Setting Pacing Pulse Width: 0.4 ms
Lead Channel Setting Sensing Sensitivity: 2 mV
Zone Setting Status: 755011
Zone Setting Status: 755011

## 2022-06-27 DIAGNOSIS — E538 Deficiency of other specified B group vitamins: Secondary | ICD-10-CM | POA: Diagnosis not present

## 2022-07-12 DIAGNOSIS — M25522 Pain in left elbow: Secondary | ICD-10-CM | POA: Diagnosis not present

## 2022-07-12 DIAGNOSIS — E538 Deficiency of other specified B group vitamins: Secondary | ICD-10-CM | POA: Diagnosis not present

## 2022-07-12 DIAGNOSIS — R0789 Other chest pain: Secondary | ICD-10-CM | POA: Diagnosis not present

## 2022-07-12 DIAGNOSIS — L0390 Cellulitis, unspecified: Secondary | ICD-10-CM | POA: Diagnosis not present

## 2022-07-12 DIAGNOSIS — R600 Localized edema: Secondary | ICD-10-CM | POA: Diagnosis not present

## 2022-07-12 DIAGNOSIS — M7989 Other specified soft tissue disorders: Secondary | ICD-10-CM | POA: Diagnosis not present

## 2022-07-12 DIAGNOSIS — M79606 Pain in leg, unspecified: Secondary | ICD-10-CM | POA: Diagnosis not present

## 2022-07-15 ENCOUNTER — Ambulatory Visit
Admission: RE | Admit: 2022-07-15 | Discharge: 2022-07-15 | Disposition: A | Discharge status: Home / Self Care | Payer: Medicare Other | Source: Ambulatory Visit | Attending: Internal Medicine | Admitting: Internal Medicine

## 2022-07-15 ENCOUNTER — Other Ambulatory Visit: Payer: Self-pay | Admitting: Internal Medicine

## 2022-07-15 DIAGNOSIS — R0789 Other chest pain: Secondary | ICD-10-CM

## 2022-07-15 DIAGNOSIS — M25522 Pain in left elbow: Secondary | ICD-10-CM

## 2022-07-15 DIAGNOSIS — R079 Chest pain, unspecified: Secondary | ICD-10-CM | POA: Diagnosis not present

## 2022-07-15 DIAGNOSIS — M7989 Other specified soft tissue disorders: Secondary | ICD-10-CM | POA: Diagnosis not present

## 2022-07-15 DIAGNOSIS — R600 Localized edema: Secondary | ICD-10-CM | POA: Diagnosis not present

## 2022-07-16 DIAGNOSIS — R399 Unspecified symptoms and signs involving the genitourinary system: Secondary | ICD-10-CM | POA: Diagnosis not present

## 2022-07-16 DIAGNOSIS — R600 Localized edema: Secondary | ICD-10-CM | POA: Diagnosis not present

## 2022-07-16 DIAGNOSIS — L0390 Cellulitis, unspecified: Secondary | ICD-10-CM | POA: Diagnosis not present

## 2022-07-16 DIAGNOSIS — M79606 Pain in leg, unspecified: Secondary | ICD-10-CM | POA: Diagnosis not present

## 2022-07-16 DIAGNOSIS — M7989 Other specified soft tissue disorders: Secondary | ICD-10-CM | POA: Diagnosis not present

## 2022-07-16 DIAGNOSIS — M25522 Pain in left elbow: Secondary | ICD-10-CM | POA: Diagnosis not present

## 2022-07-19 DIAGNOSIS — M25522 Pain in left elbow: Secondary | ICD-10-CM | POA: Diagnosis not present

## 2022-07-24 DIAGNOSIS — I10 Essential (primary) hypertension: Secondary | ICD-10-CM | POA: Diagnosis not present

## 2022-07-24 DIAGNOSIS — Z9181 History of falling: Secondary | ICD-10-CM | POA: Diagnosis not present

## 2022-07-24 NOTE — Progress Notes (Signed)
Remote pacemaker transmission.   

## 2022-09-25 ENCOUNTER — Ambulatory Visit (INDEPENDENT_AMBULATORY_CARE_PROVIDER_SITE_OTHER): Payer: Medicare Other

## 2022-09-25 DIAGNOSIS — I495 Sick sinus syndrome: Secondary | ICD-10-CM

## 2022-09-30 LAB — CUP PACEART REMOTE DEVICE CHECK
Battery Remaining Longevity: 15 mo
Battery Voltage: 2.89 V
Brady Statistic AP VP Percent: 1.49 %
Brady Statistic AP VS Percent: 97.97 %
Brady Statistic AS VP Percent: 0 %
Brady Statistic AS VS Percent: 0.54 %
Brady Statistic RA Percent Paced: 98.7 %
Brady Statistic RV Percent Paced: 1.79 %
Date Time Interrogation Session: 20240304094801
Implantable Lead Connection Status: 753985
Implantable Lead Connection Status: 753985
Implantable Lead Implant Date: 20151014
Implantable Lead Implant Date: 20151014
Implantable Lead Location: 753859
Implantable Lead Location: 753860
Implantable Lead Model: 5076
Implantable Lead Model: 5076
Implantable Pulse Generator Implant Date: 20151014
Lead Channel Impedance Value: 361 Ohm
Lead Channel Impedance Value: 399 Ohm
Lead Channel Impedance Value: 418 Ohm
Lead Channel Impedance Value: 494 Ohm
Lead Channel Pacing Threshold Amplitude: 0.375 V
Lead Channel Pacing Threshold Amplitude: 0.75 V
Lead Channel Pacing Threshold Pulse Width: 0.4 ms
Lead Channel Pacing Threshold Pulse Width: 0.4 ms
Lead Channel Sensing Intrinsic Amplitude: 1 mV
Lead Channel Sensing Intrinsic Amplitude: 1 mV
Lead Channel Sensing Intrinsic Amplitude: 18.5 mV
Lead Channel Sensing Intrinsic Amplitude: 18.5 mV
Lead Channel Setting Pacing Amplitude: 2 V
Lead Channel Setting Pacing Amplitude: 2.5 V
Lead Channel Setting Pacing Pulse Width: 0.4 ms
Lead Channel Setting Sensing Sensitivity: 2 mV
Zone Setting Status: 755011
Zone Setting Status: 755011

## 2022-10-30 NOTE — Progress Notes (Signed)
Remote pacemaker transmission.   

## 2022-12-16 ENCOUNTER — Emergency Department (HOSPITAL_COMMUNITY): Payer: Medicare Other

## 2022-12-16 ENCOUNTER — Encounter (HOSPITAL_COMMUNITY): Payer: Self-pay | Admitting: Internal Medicine

## 2022-12-16 ENCOUNTER — Observation Stay (HOSPITAL_COMMUNITY): Payer: Medicare Other

## 2022-12-16 ENCOUNTER — Other Ambulatory Visit: Payer: Self-pay

## 2022-12-16 ENCOUNTER — Observation Stay (HOSPITAL_COMMUNITY)
Admission: EM | Admit: 2022-12-16 | Discharge: 2022-12-18 | Disposition: A | Discharge status: Home Health | Payer: Medicare Other | Attending: Student | Admitting: Student

## 2022-12-16 DIAGNOSIS — I639 Cerebral infarction, unspecified: Principal | ICD-10-CM | POA: Insufficient documentation

## 2022-12-16 DIAGNOSIS — I5022 Chronic systolic (congestive) heart failure: Secondary | ICD-10-CM | POA: Insufficient documentation

## 2022-12-16 DIAGNOSIS — R479 Unspecified speech disturbances: Secondary | ICD-10-CM | POA: Diagnosis present

## 2022-12-16 DIAGNOSIS — R41841 Cognitive communication deficit: Secondary | ICD-10-CM | POA: Insufficient documentation

## 2022-12-16 DIAGNOSIS — Z7901 Long term (current) use of anticoagulants: Secondary | ICD-10-CM | POA: Insufficient documentation

## 2022-12-16 DIAGNOSIS — N1831 Chronic kidney disease, stage 3a: Secondary | ICD-10-CM | POA: Insufficient documentation

## 2022-12-16 DIAGNOSIS — I130 Hypertensive heart and chronic kidney disease with heart failure and stage 1 through stage 4 chronic kidney disease, or unspecified chronic kidney disease: Secondary | ICD-10-CM | POA: Insufficient documentation

## 2022-12-16 DIAGNOSIS — Z950 Presence of cardiac pacemaker: Secondary | ICD-10-CM | POA: Diagnosis present

## 2022-12-16 DIAGNOSIS — I2510 Atherosclerotic heart disease of native coronary artery without angina pectoris: Secondary | ICD-10-CM | POA: Diagnosis not present

## 2022-12-16 DIAGNOSIS — Z86718 Personal history of other venous thrombosis and embolism: Secondary | ICD-10-CM | POA: Insufficient documentation

## 2022-12-16 DIAGNOSIS — Z79899 Other long term (current) drug therapy: Secondary | ICD-10-CM | POA: Diagnosis not present

## 2022-12-16 DIAGNOSIS — E039 Hypothyroidism, unspecified: Secondary | ICD-10-CM | POA: Diagnosis not present

## 2022-12-16 DIAGNOSIS — R4701 Aphasia: Secondary | ICD-10-CM | POA: Insufficient documentation

## 2022-12-16 DIAGNOSIS — I495 Sick sinus syndrome: Secondary | ICD-10-CM | POA: Diagnosis present

## 2022-12-16 DIAGNOSIS — R569 Unspecified convulsions: Secondary | ICD-10-CM | POA: Diagnosis not present

## 2022-12-16 DIAGNOSIS — I4729 Other ventricular tachycardia: Secondary | ICD-10-CM | POA: Diagnosis present

## 2022-12-16 DIAGNOSIS — I5020 Unspecified systolic (congestive) heart failure: Secondary | ICD-10-CM | POA: Diagnosis not present

## 2022-12-16 DIAGNOSIS — R29898 Other symptoms and signs involving the musculoskeletal system: Secondary | ICD-10-CM | POA: Diagnosis not present

## 2022-12-16 LAB — URINALYSIS, ROUTINE W REFLEX MICROSCOPIC
Bacteria, UA: NONE SEEN
Bilirubin Urine: NEGATIVE
Glucose, UA: NEGATIVE mg/dL
Ketones, ur: NEGATIVE mg/dL
Leukocytes,Ua: NEGATIVE
Nitrite: NEGATIVE
Protein, ur: NEGATIVE mg/dL
Specific Gravity, Urine: 1.014 (ref 1.005–1.030)
pH: 7 (ref 5.0–8.0)

## 2022-12-16 LAB — TSH: TSH: 1.635 u[IU]/mL (ref 0.350–4.500)

## 2022-12-16 LAB — CBC
HCT: 38.1 % (ref 36.0–46.0)
Hemoglobin: 12.3 g/dL (ref 12.0–15.0)
MCH: 30.1 pg (ref 26.0–34.0)
MCHC: 32.3 g/dL (ref 30.0–36.0)
MCV: 93.4 fL (ref 80.0–100.0)
Platelets: 132 10*3/uL — ABNORMAL LOW (ref 150–400)
RBC: 4.08 MIL/uL (ref 3.87–5.11)
RDW: 12.8 % (ref 11.5–15.5)
WBC: 5.2 10*3/uL (ref 4.0–10.5)
nRBC: 0 % (ref 0.0–0.2)

## 2022-12-16 LAB — DIFFERENTIAL
Abs Immature Granulocytes: 0.02 10*3/uL (ref 0.00–0.07)
Basophils Absolute: 0 10*3/uL (ref 0.0–0.1)
Basophils Relative: 1 %
Eosinophils Absolute: 0.2 10*3/uL (ref 0.0–0.5)
Eosinophils Relative: 4 %
Immature Granulocytes: 0 %
Lymphocytes Relative: 26 %
Lymphs Abs: 1.4 10*3/uL (ref 0.7–4.0)
Monocytes Absolute: 0.5 10*3/uL (ref 0.1–1.0)
Monocytes Relative: 10 %
Neutro Abs: 3.1 10*3/uL (ref 1.7–7.7)
Neutrophils Relative %: 59 %

## 2022-12-16 LAB — PROTIME-INR
INR: 1 (ref 0.8–1.2)
Prothrombin Time: 13.7 seconds (ref 11.4–15.2)

## 2022-12-16 LAB — I-STAT CHEM 8, ED
BUN: 18 mg/dL (ref 8–23)
Calcium, Ion: 1.1 mmol/L — ABNORMAL LOW (ref 1.15–1.40)
Chloride: 103 mmol/L (ref 98–111)
Creatinine, Ser: 1.1 mg/dL — ABNORMAL HIGH (ref 0.44–1.00)
Glucose, Bld: 110 mg/dL — ABNORMAL HIGH (ref 70–99)
HCT: 35 % — ABNORMAL LOW (ref 36.0–46.0)
Hemoglobin: 11.9 g/dL — ABNORMAL LOW (ref 12.0–15.0)
Potassium: 3.8 mmol/L (ref 3.5–5.1)
Sodium: 138 mmol/L (ref 135–145)
TCO2: 24 mmol/L (ref 22–32)

## 2022-12-16 LAB — COMPREHENSIVE METABOLIC PANEL
ALT: 12 U/L (ref 0–44)
AST: 27 U/L (ref 15–41)
Albumin: 3.2 g/dL — ABNORMAL LOW (ref 3.5–5.0)
Alkaline Phosphatase: 74 U/L (ref 38–126)
Anion gap: 11 (ref 5–15)
BUN: 16 mg/dL (ref 8–23)
CO2: 21 mmol/L — ABNORMAL LOW (ref 22–32)
Calcium: 8.5 mg/dL — ABNORMAL LOW (ref 8.9–10.3)
Chloride: 106 mmol/L (ref 98–111)
Creatinine, Ser: 1.15 mg/dL — ABNORMAL HIGH (ref 0.44–1.00)
GFR, Estimated: 46 mL/min — ABNORMAL LOW (ref >60)
Glucose, Bld: 112 mg/dL — ABNORMAL HIGH (ref 70–99)
Potassium: 4 mmol/L (ref 3.5–5.1)
Sodium: 138 mmol/L (ref 135–145)
Total Bilirubin: 0.5 mg/dL (ref 0.3–1.2)
Total Protein: 6.1 g/dL — ABNORMAL LOW (ref 6.5–8.1)

## 2022-12-16 LAB — ETHANOL: Alcohol, Ethyl (B): <10 mg/dL (ref <10)

## 2022-12-16 LAB — CBG MONITORING, ED: Glucose-Capillary: 117 mg/dL — ABNORMAL HIGH (ref 70–99)

## 2022-12-16 LAB — APTT: aPTT: 24 seconds (ref 24–36)

## 2022-12-16 MED ORDER — ACETAMINOPHEN 325 MG PO TABS: 650.0000 mg | ORAL_TABLET | ORAL | Status: DC | PRN

## 2022-12-16 MED ORDER — ASPIRIN 81 MG PO CHEW
81.0000 mg | CHEWABLE_TABLET | Freq: Every day | ORAL | Status: DC
  Administered 2022-12-16 – 2022-12-18 (×3): 81 mg via ORAL
  Filled 2022-12-16 (×3): qty 1

## 2022-12-16 MED ORDER — STROKE: EARLY STAGES OF RECOVERY BOOK: Freq: Once | Status: DC

## 2022-12-16 MED ORDER — STROKE: EARLY STAGES OF RECOVERY BOOK
Freq: Once | Status: AC
  Filled 2022-12-16: qty 1

## 2022-12-16 MED ORDER — SODIUM CHLORIDE 0.9% FLUSH
3.0000 mL | Freq: Once | INTRAVENOUS | Status: AC
  Administered 2022-12-16: 3 mL via INTRAVENOUS

## 2022-12-16 MED ORDER — HYDRALAZINE HCL 20 MG/ML IJ SOLN: 5.0000 mg | Freq: Four times a day (QID) | INTRAMUSCULAR | Status: DC | PRN

## 2022-12-16 MED ORDER — SENNOSIDES-DOCUSATE SODIUM 8.6-50 MG PO TABS: 1.0000 | ORAL_TABLET | Freq: Every evening | ORAL | Status: DC | PRN

## 2022-12-16 MED ORDER — LEVOTHYROXINE SODIUM 25 MCG PO TABS
125.0000 ug | ORAL_TABLET | Freq: Every day | ORAL | Status: DC
  Administered 2022-12-17 – 2022-12-18 (×2): 125 ug via ORAL
  Filled 2022-12-16 (×2): qty 1

## 2022-12-16 MED ORDER — ACETAMINOPHEN 160 MG/5ML PO SOLN: 650.0000 mg | ORAL | Status: DC | PRN

## 2022-12-16 MED ORDER — ENOXAPARIN SODIUM 40 MG/0.4ML IJ SOSY
40.0000 mg | PREFILLED_SYRINGE | INTRAMUSCULAR | Status: DC
  Administered 2022-12-17 – 2022-12-18 (×2): 40 mg via SUBCUTANEOUS
  Filled 2022-12-16 (×2): qty 0.4

## 2022-12-16 MED ORDER — CLOPIDOGREL BISULFATE 300 MG PO TABS
300.0000 mg | ORAL_TABLET | Freq: Once | ORAL | Status: AC
  Administered 2022-12-16: 300 mg via ORAL
  Filled 2022-12-16: qty 1

## 2022-12-16 MED ORDER — ACETAMINOPHEN 650 MG RE SUPP: 650.0000 mg | RECTAL | Status: DC | PRN

## 2022-12-16 MED ORDER — IOHEXOL 350 MG/ML SOLN
100.0000 mL | Freq: Once | INTRAVENOUS | Status: AC | PRN
  Administered 2022-12-16: 100 mL via INTRAVENOUS

## 2022-12-16 MED ORDER — METOPROLOL TARTRATE 25 MG PO TABS
25.0000 mg | ORAL_TABLET | Freq: Two times a day (BID) | ORAL | Status: DC
  Administered 2022-12-17 – 2022-12-18 (×4): 25 mg via ORAL
  Filled 2022-12-16 (×4): qty 1

## 2022-12-16 MED ORDER — CLOPIDOGREL BISULFATE 75 MG PO TABS
75.0000 mg | ORAL_TABLET | Freq: Every day | ORAL | Status: DC
  Administered 2022-12-17 – 2022-12-18 (×2): 75 mg via ORAL
  Filled 2022-12-16 (×2): qty 1

## 2022-12-16 NOTE — Consult Note (Signed)
Neurology Consultation  Reason for Consult: Aphasia Referring Physician: Dr. Jeraldine Loots  CC: None  History is obtained from: EMS, family and chart  HPI: Michelle Winters is a 89 y.o. female with history of DVT, CAD, cardiomyopathy status post pacemaker, CKD stage III, hypertension and hypothyroidism who presents with sudden onset aphasia.  Her sons last spoke to her about 9 or 10 PM last night and at that time, she was speaking normally.  At 10:00 AM today, she called out for one of her sons and was having trouble getting her words out.  She was unable to form sentences and had some difficulty understanding what was being said to her.  She was taking Eliquis for DVT last year, but this was supposed to be a temporary medication, and no Eliquis was in the bag of medications that one of her sons brought to the ED.   LKW: 2100 5/19 TNK given?: no, outside of window IR Thrombectomy? No, no LVO Modified Rankin Scale: 1-No significant post stroke disability and can perform usual duties with stroke symptoms  ROS:  Unable to obtain due to altered mental status.   Past Medical History:  Diagnosis Date   Arthritis    "hands, fingers, toes" (05/11/2014)   Bradycardia    CAD (coronary artery disease)    a. cath 07/01/13: LM no obs dz, LAD widely patent no obs dz, LCx normal in appearance, RCA  small, nondominant vessel. Catheter-induced spasm at the ostium, EF 50-55%   Cancer (HCC)    Cardiomyopathy (HCC)    a. Echo 10/15: Inferior and inferolateral AK,  Anterolateral HK, EF 40%;  b.  Echo (1/16):  EF 55-60%, Gr 1 DD, PASP 27, mild to mod TR, trivial AI, mild MR   Chronic kidney disease (CKD), stage III (moderate) (HCC)    Cystitis 03/29/1949   "tube to bladder was in a kink & I had polyps; surgically corrected"   DVT (deep venous thrombosis) (HCC)    Heart murmur    "slight"   Heart palpitations    a. paroxysmal SVT, PVCs, NSVT. b. echo 03/27/14: EF 50-55%, nl systolic function, AK of  basalinferior myocardium, possible HK of the apicallateral and inferolateral myocardium, Ao trivial regurg, mild MR, mild LAE, trivial pulmonic regurg   Hypertension    Hypothyroidism    Osteopenia 09/27/2015   T score -2.4 FRAX 17%/6.5%   Pacemaker    a.  Medtronic MRI compatible ADVISA pulse generator serial number [id number].   Thyroid nodule    Wide-complex tachycardia 03/25/2014     Family History  Problem Relation Age of Onset   Multiple sclerosis Mother    Heart attack Father    Cancer Sister        Skin cancer   Diabetes Sister    Heart disease Brother    Cancer Brother        Prostate   Leukemia Maternal Grandmother    Nephritis Paternal Grandmother      Social History:   reports that she has never smoked. She has never used smokeless tobacco. She reports that she does not drink alcohol and does not use drugs.  Medications  Current Facility-Administered Medications:    sodium chloride flush (NS) 0.9 % injection 3 mL, 3 mL, Intravenous, Once, Gerhard Munch, MD  Current Outpatient Medications:    chlorthalidone (HYGROTON) 25 MG tablet, Take 12.5 mg by mouth every morning., Disp: , Rfl:    Cyanocobalamin (VITAMIN B-12 IJ), Inject 1 Dose as directed every 30 (  thirty) days., Disp: , Rfl:    ELIQUIS 5 MG TABS tablet, Take 5 mg by mouth 2 (two) times daily., Disp: , Rfl:    hydrochlorothiazide (HYDRODIURIL) 25 MG tablet, Take 12.5 mg by mouth daily. , Disp: , Rfl:    levothyroxine (SYNTHROID, LEVOTHROID) 125 MCG tablet, Take 125 mcg by mouth daily., Disp: , Rfl: 3   loratadine (CLARITIN) 10 MG tablet, Take 10 mg by mouth daily as needed for allergies. , Disp: , Rfl:    losartan (COZAAR) 50 MG tablet, Take 1 tablet by mouth daily., Disp: , Rfl:    metoprolol succinate (TOPROL-XL) 50 MG 24 hr tablet, Take 50 mg by mouth daily., Disp: , Rfl:    sodium chloride (OCEAN) 0.65 % SOLN nasal spray, Place 1 spray into both nostrils as needed for congestion., Disp: , Rfl:     telmisartan (MICARDIS) 40 MG tablet, Take 40 mg by mouth daily., Disp: , Rfl:    Exam: Current vital signs: Wt 57.4 kg   BMI 21.72 kg/m  Vital signs in last 24 hours: Weight:  [57.4 kg] 57.4 kg (05/20 1100)  GENERAL: Awake, alert, in no acute distress Psych: Affect appropriate for situation, patient is calm and cooperative with examination Head: Normocephalic and atraumatic, without obvious abnormality EENT: Normal conjunctivae, moist mucous membranes, no OP obstruction LUNGS: Normal respiratory effort. Non-labored breathing on room air CV: Regular rate and rhythm on telemetry Extremities: warm, well perfused, without obvious deformity  NEURO:  Mental Status: Awake, alert, responds to name and is intermittently able to follow simple commands or mimic She is not able to provide a clear and coherent history of present illness. Speech/Language: speech is aphasic in single words No neglect is noted Cranial Nerves:  II: PERRL blinks to threat bilaterally III, IV, VI: EOMI. Lid elevation symmetric and full.  VII: Face is symmetric resting and smiling.  VIII: Hearing intact to voice IX, X: Phonation normal.  XII: Tongue protrudes midline without fasciculations.   Motor: Good antigravity strength in bilateral upper and lower extremities Tone is normal. Bulk is normal.  Sensation: Intact to light touch bilaterally in all four extremities.  Coordination: FTN intact bilaterally.  No pronator drift.  Gait: Deferred  NIHSS: 1a Level of Conscious.: 0 1b LOC Questions: 2 1c LOC Commands: 2 2 Best Gaze: 0 3 Visual: 0 4 Facial Palsy: 0 5a Motor Arm - left: 0 5b Motor Arm - Right: 0 6a Motor Leg - Left: 0 6b Motor Leg - Right: 0 7 Limb Ataxia: 0 8 Sensory: 0 9 Best Language: 2 10 Dysarthria: 0 11 Extinct. and Inatten.: 0 TOTAL: 6   Labs I have reviewed labs in epic and the results pertinent to this consultation are:   CBC    Component Value Date/Time   WBC 5.2 12/16/2022  1100   RBC 4.08 12/16/2022 1100   HGB 11.9 (L) 12/16/2022 1107   HCT 35.0 (L) 12/16/2022 1107   PLT 132 (L) 12/16/2022 1100   MCV 93.4 12/16/2022 1100   MCH 30.1 12/16/2022 1100   MCHC 32.3 12/16/2022 1100   RDW 12.8 12/16/2022 1100   LYMPHSABS 1.4 12/16/2022 1100   MONOABS 0.5 12/16/2022 1100   EOSABS 0.2 12/16/2022 1100   BASOSABS 0.0 12/16/2022 1100    CMP     Component Value Date/Time   NA 138 12/16/2022 1107   NA 139 04/01/2019 1035   K 3.8 12/16/2022 1107   CL 103 12/16/2022 1107   CO2 25 04/22/2022 1758  GLUCOSE 110 (H) 12/16/2022 1107   BUN 18 12/16/2022 1107   BUN 17 04/01/2019 1035   CREATININE 1.10 (H) 12/16/2022 1107   CALCIUM 9.2 04/22/2022 1758   PROT 6.8 04/22/2022 1758   ALBUMIN 4.1 04/22/2022 1758   AST 22 04/22/2022 1758   ALT 9 04/22/2022 1758   ALKPHOS 68 04/22/2022 1758   BILITOT 0.5 04/22/2022 1758   GFRNONAA 52 (L) 04/22/2022 1758   GFRAA 52 (L) 04/01/2019 1035    Lipid Panel  No results found for: "CHOL", "TRIG", "HDL", "CHOLHDL", "VLDL", "LDLCALC", "LDLDIRECT"   Imaging I have reviewed the images obtained:  CT-scan of the brain: No acute abnormality  CTA head and neck: No LVO  CTP: 8 mm penumbra in left MCA territory  MRI examination of the brain: Pending  Assessment: 89 year old patient with the above past medical history presents with aphasia which started this morning.  Her last known well time when she was speaking normally was 9 or 10 last night when she spoke with her sons, so she is unfortunately out of the TNK window.  CTA shows no LVO, and CTP reveals small penumbra in left MCA territory likely explaining patient's symptoms.  Will admit for full stroke workup and SLP.  Impression: Acute ischemic stroke  Recommendations: Stroke/TIA Workup  - Admit for stroke workup - Permissive HTN x48 hrs from sx onset or until stroke ruled out by MRI goal BP <220/110. PRN labetalol or hydralazine if BP above these parameters. Avoid oral  antihypertensives. - MRI brain wo contrast - TTE w/ bubble - Check A1c and LDL + add statin per guidelines -Aspirin 81 mg daily and Plavix 75 mg daily antiplt/anticoag, load with 300 mg Plavix - q4 hr neuro checks - STAT head CT for any change in neuro exam - Tele - PT/OT/SLP - Stroke education - Amb referral to neurology upon discharge    Pt seen by NP/Neuro and later by MD. Note/plan to be edited by MD as needed.  Cortney E Ernestina Columbia , MSN, AGACNP-BC [facility] See Amion for schedule and pager information 12/16/2022 11:44 AM    Attending Neurohospitalist Addendum Patient seen and examined with APP/Resident. Agree with the history and physical as documented above. Agree with the plan as documented, which I helped formulate. I have edited the note above to reflect my full findings and recommendations. I have independently reviewed the chart, obtained history, review of systems and examined the patient.I have personally reviewed pertinent head/neck/spine imaging (CT/MRI). Please feel free to call with any questions.  -- Bing Neighbors, MD [facility] [phone]  If 7pm- 7am, please page neurology on call as listed in AMION.

## 2022-12-16 NOTE — ED Notes (Signed)
Pt has aphasia but is able to communicate with gestures when she cannot find the correct words. Can answer yes or no questions appropriately. PT/OT to bedside and pt was able to ambulate with her cane and stand-by assist.

## 2022-12-16 NOTE — Evaluation (Signed)
Physical Therapy Evaluation Patient Details Name: Michelle Winters MRN: [id number] DOB: 09-Jun-1932 Today's Date: 12/16/2022  History of Present Illness  89 y.o. female presents to [facility] hospital on 12/16/2022 with sudden onset aphasia. CT perfusion head shows small penumbra in L MCA territory. PMH includes DVT, CAD, PPM, CKD, HTN, hypothyroidism.  Clinical Impression  Pt presents to PT with deficits in communication, no significant deficits in strength, gait, balance noted at this time. Pt is able to transfer and ambulate without physical assistance, carrying quad cane and utilizing PRN. Pt fatigues prior to PT being able to provide dynamic gait challenges during this evaluation. Pt demonstrates difficulty following multi-step commands at times, along with significant expressive communication deficits. PT will keep pt on caseload to follow up for dynamic gait/balance  challenges next session. PT recommends discharge home, no post-acute PT or DME indicated at this time.     Recommendations for follow up therapy are one component of a multi-disciplinary discharge planning process, led by the attending physician.  Recommendations may be updated based on patient status, additional functional criteria and insurance authorization.  Follow Up Recommendations       Assistance Recommended at Discharge PRN  Patient can return home with the following  Direct supervision/assist for medications management;Direct supervision/assist for financial management;Assist for transportation    Equipment Recommendations None recommended by PT  Recommendations for Other Services       Functional Status Assessment Patient has had a recent decline in their functional status and demonstrates the ability to make significant improvements in function in a reasonable and predictable amount of time.     Precautions / Restrictions Precautions Precautions: Fall Precaution Comments: pt reports a fall in november, none since  but she remains cautious Restrictions Weight Bearing Restrictions: No      Mobility  Bed Mobility Overal bed mobility: Modified Independent Bed Mobility: Supine to Sit, Sit to Supine     Supine to sit: Modified independent (Device/Increase time) Sit to supine: Modified independent (Device/Increase time)   General bed mobility comments: increased time    Transfers Overall transfer level: Modified independent Equipment used: Quad cane Transfers: Sit to/from Stand Sit to Stand: Modified independent (Device/Increase time)                Ambulation/Gait Ambulation/Gait assistance: Modified independent (Device/Increase time) Gait Distance (Feet): 300 Feet Assistive device: Quad cane Gait Pattern/deviations: Step-through pattern Gait velocity: functional Gait velocity interpretation: >2.62 ft/sec, indicative of community ambulatory   General Gait Details: steady step-through gait, no significant balance or gait deviations noted. Pt fatigues prior to PT providing dynamic gait challenges  Stairs            Wheelchair Mobility    Modified Rankin (Stroke Patients Only)       Balance Overall balance assessment: Mild deficits observed, not formally tested Sitting-balance support: No upper extremity supported, Feet supported Sitting balance-Leahy Scale: Good     Standing balance support: Single extremity supported, Reliant on assistive device for balance Standing balance-Leahy Scale: Poor                               Pertinent Vitals/Pain Pain Assessment Pain Assessment: No/denies pain    Home Living Family/patient expects to be discharged to:: Private residence Living Arrangements: Children Available Help at Discharge: Family;Available PRN/intermittently Type of Home: House Home Access: Ramped entrance;Stairs to enter Entrance Stairs-Rails: Right Entrance Stairs-Number of Steps: 2   Home Layout:  One level Home Equipment: Research officer, trade union - power Additional Comments: wheelchair is hisbands, who has passed    Prior Function Prior Level of Function : Independent/Modified Independent;Driving             Mobility Comments: enjoys working in her garden       Higher education careers adviser   Dominant Hand: Right    Extremity/Trunk Assessment   Upper Extremity Assessment Upper Extremity Assessment: Defer to OT evaluation    Lower Extremity Assessment Lower Extremity Assessment: Overall WFL for tasks assessed    Cervical / Trunk Assessment Cervical / Trunk Assessment: Normal  Communication   Communication: Expressive difficulties;Receptive difficulties (expressive deficits appear more significant than receptive deficits)  Cognition Arousal/Alertness: Awake/alert Behavior During Therapy: WFL for tasks assessed/performed Overall Cognitive Status: Difficult to assess                                          General Comments General comments (skin integrity, edema, etc.): VSS on RA    Exercises     Assessment/Plan    PT Assessment Patient needs continued PT services  PT Problem List Decreased balance       PT Treatment Interventions DME instruction;Balance training;Neuromuscular re-education;Patient/family education;Cognitive remediation    PT Goals (Current goals can be found in the Care Plan section)  Acute Rehab PT Goals Patient Stated Goal: to return to independence PT Goal Formulation: With patient/family Time For Goal Achievement: 12/30/22 Potential to Achieve Goals: Fair    Frequency Min 1X/week     Co-evaluation               AM-PAC PT "6 Clicks" Mobility  Outcome Measure Help needed turning from your back to your side while in a flat bed without using bedrails?: None Help needed moving from lying on your back to sitting on the side of a flat bed without using bedrails?: None Help needed moving to and from a bed to a chair (including a wheelchair)?: None Help  needed standing up from a chair using your arms (e.g., wheelchair or bedside chair)?: None Help needed to walk in hospital room?: None Help needed climbing 3-5 steps with a railing? : A Little 6 Click Score: 23    End of Session Equipment Utilized During Treatment: Gait belt Activity Tolerance: Patient tolerated treatment well Patient left: in bed;with call bell/phone within reach;with family/visitor present Nurse Communication: Mobility status PT Visit Diagnosis: Other symptoms and signs involving the nervous system (R29.898)    Time: 1610-9604 PT Time Calculation (min) (ACUTE ONLY): 21 min   Charges:   PT Evaluation $PT Eval Low Complexity: 1 Low          Arlyss Gandy, PT, DPT Acute Rehabilitation Office [phone]   Arlyss Gandy 12/16/2022, 5:18 PM

## 2022-12-16 NOTE — Code Documentation (Signed)
Stroke Response Nurse Documentation Code Documentation  OTHELLO DIONICIO is a 89 y.o. female arriving to [facility]  via [facility] on 5/20 with past medical hx of hypertension. On No antithrombotic. Code stroke was activated by EMS.   Patient from home where she was LKW at 2100 on 5/19 and now complaining of aphasia. Patient lives at home with one of her sons who states he last spoke to her in her normal state of health last night at 2100. Patient told EMS that she woke up today at 7 but did not speak to nor was seen by anyone and then called her son at 1000 because something didn't feel right. On this phone call, the son noticed severe aphasia and called 911.    Stroke team at the bedside on patient arrival. Labs drawn and patient cleared for CT by Dr. Jeraldine Loots. Patient to CT with team. NIHSS 6, see documentation for details and code stroke times. Patient with disoriented, not following commands, and Global aphasia  on exam. The following imaging was completed:  CT Head, CTA, and CTP. Patient is not a candidate for IV Thrombolytic due to LKW > 4.5 hours ago. Patient is not not a candidate for IR due to no LVO on imaging per MD.   Care Plan: q2 hour NIHSS, EEG.   Bedside handoff with ED RN Elexes.    Pearlie Oyster  Stroke Response RN

## 2022-12-16 NOTE — H&P (Signed)
History and Physical    Michelle Winters:[id number] DOB: June 12, 1932 DOA: 12/16/2022  PCP: [facility] (Confirm with patient/family/NH records and if not entered, this has to be entered at [facility] point of entry) Patient coming from: Home  I have personally briefly reviewed patient's old medical records in [facility]  Chief Complaint: speech problems  HPI: Michelle Winters is a 89 y.o. female with medical history significant of HTN, sick sinus syndrome status post PPM, NSVT, CAD, cardiomyopathy with recovered LVEF, brought in by family member for acute aphasia.  Symptoms started around 10 AM this morning, patient was found to develop aphasia, appears that patient had no problem understanding family members talking but when she talks, she talked " in gibberish, not full sentence and read the first 2 words of each sentence makes any sense" denies any headache no vision problems denies any numbness weakness in the limbs.  No recent new medications she does not take any narcotics or sedation medications.  She is not taking aspirin.  No history of A-fib.  ED Course: Afebrile, none tachycardia nonhypotensive.  Code stroke was called CT head negative for intracranial bleeding or other acute structural problems, CTA showed hypoperfusion within the left frontal lobe.  EKG showed normal sinus rhythm, no acute ST changes.  No acute PR or QTc interval changes, but frequent PVCs  Patient was started on aspirin and Plavix regimen  Review of Systems: As per HPI otherwise 14 point review of systems negative.    Past Medical History:  Diagnosis Date   Arthritis    "hands, fingers, toes" (05/11/2014)   Bradycardia    CAD (coronary artery disease)    a. cath 07/01/13: LM no obs dz, LAD widely patent no obs dz, LCx normal in appearance, RCA  small, nondominant vessel. Catheter-induced spasm at the ostium, EF 50-55%   Cancer (HCC)    Cardiomyopathy (HCC)    a. Echo 10/15:  Inferior and inferolateral AK,  Anterolateral HK, EF 40%;  b.  Echo (1/16):  EF 55-60%, Gr 1 DD, PASP 27, mild to mod TR, trivial AI, mild MR   Chronic kidney disease (CKD), stage III (moderate) (HCC)    Cystitis 03/29/1949   "tube to bladder was in a kink & I had polyps; surgically corrected"   DVT (deep venous thrombosis) (HCC)    Heart murmur    "slight"   Heart palpitations    a. paroxysmal SVT, PVCs, NSVT. b. echo 03/27/14: EF 50-55%, nl systolic function, AK of basalinferior myocardium, possible HK of the apicallateral and inferolateral myocardium, Ao trivial regurg, mild MR, mild LAE, trivial pulmonic regurg   Hypertension    Hypothyroidism    Osteopenia 09/27/2015   T score -2.4 FRAX 17%/6.5%   Pacemaker    a.  Medtronic MRI compatible ADVISA pulse generator serial number [id number].   Thyroid nodule    Wide-complex tachycardia 03/25/2014    Past Surgical History:  Procedure Laterality Date   CARDIAC CATHETERIZATION  06/2013   normal coronary arteries   CATARACT EXTRACTION W/ INTRAOCULAR LENS  IMPLANT, BILATERAL Bilateral    CHOLECYSTECTOMY     DILATION AND CURETTAGE OF UTERUS     HYSTEROSCOPY  1950's   "tube to bladder was in a kink & I had polyps; surgically corrected"   LEFT HEART CATHETERIZATION WITH CORONARY ANGIOGRAM [name] 07/01/2013   Procedure: LEFT HEART CATHETERIZATION WITH CORONARY ANGIOGRAM;  Surgeon: Micheline Chapman, MD;  Location: [facility] CATH LAB;  Service: Cardiovascular;  Laterality: [name];   PACEMAKER INSERTION  05/11/2014   MDT dual chamber Advisa pacemaker implanted by Dr Graciela Husbands -- MRI compatible system   PERMANENT PACEMAKER INSERTION [name] 05/11/2014   Procedure: PERMANENT PACEMAKER INSERTION;  Surgeon: Duke Salvia, MD;  Location: [facility] CATH LAB;  Service: Cardiovascular;  Laterality: [name];   Thyroid nodule biopsy     THYROIDECTOMY [name] 01/26/2016   Procedure: TOTAL THYROIDECTOMY;  Surgeon: Darnell Level, MD;  Location: [facility] OR;  Service: General;  Laterality: [name];    TONSILLECTOMY     URETER SURGERY Left many years ago? 1960     reports that she has never smoked. She has never used smokeless tobacco. She reports that she does not drink alcohol and does not use drugs.  Allergies  Allergen Reactions   Lisinopril Other (See Comments)    Chest tightness   Simvastatin Other (See Comments)    Muscle pain   Welchol [Colesevelam Hcl] Other (See Comments)    Muscle pain    Amiodarone Other (See Comments)    Lowered heart rate    Ciprofloxacin Nausea Only   Zetia [Ezetimibe] Nausea Only    Family History  Problem Relation Age of Onset   Multiple sclerosis Mother    Heart attack Father    Cancer Sister        Skin cancer   Diabetes Sister    Heart disease Brother    Cancer Brother        Prostate   Leukemia Maternal Grandmother    Nephritis Paternal Grandmother      Prior to Admission medications   Medication Sig Start Date End Date Taking? Authorizing Provider  chlorthalidone (HYGROTON) 25 MG tablet Take 12.5 mg by mouth every morning. 11/25/21   [provider]  Cyanocobalamin (VITAMIN B-12 IJ) Inject 1 Dose as directed every 30 (thirty) days.    [provider]  ELIQUIS 5 MG TABS tablet Take 5 mg by mouth 2 (two) times daily. 11/04/21   [provider]  hydrochlorothiazide (HYDRODIURIL) 25 MG tablet Take 12.5 mg by mouth daily.     [provider]  levothyroxine (SYNTHROID, LEVOTHROID) 125 MCG tablet Take 125 mcg by mouth daily. 09/04/17   [provider]  loratadine (CLARITIN) 10 MG tablet Take 10 mg by mouth daily as needed for allergies.     [provider]  losartan (COZAAR) 50 MG tablet Take 1 tablet by mouth daily.    [provider]  metoprolol succinate (TOPROL-XL) 50 MG 24 hr tablet Take 50 mg by mouth daily. 11/24/21   [provider]  sodium chloride (OCEAN) 0.65 % SOLN nasal spray Place 1 spray into both nostrils as needed for congestion.    [provider]  telmisartan (MICARDIS) 40 MG tablet Take 40 mg by mouth daily. 09/22/21   [provider]    Physical Exam: Vitals:   12/16/22 1100 12/16/22 1145 12/16/22 1200  BP:  (!) 152/58 (!) 163/111  Pulse:  72 87  Resp:  (!) 27 (!) 24  SpO2:  100% 98%  Weight: 57.4 kg      Constitutional: NAD, calm, comfortable Vitals:   12/16/22 1100 12/16/22 1145 12/16/22 1200  BP:  (!) 152/58 (!) 163/111  Pulse:  72 87  Resp:  (!) 27 (!) 24  SpO2:  100% 98%  Weight: 57.4 kg     Eyes: PERRL, lids and conjunctivae normal ENMT: Mucous membranes are moist. Posterior pharynx clear of any exudate or lesions.Normal dentition.  Neck: normal, supple, no masses, no thyromegaly Respiratory: clear to auscultation bilaterally, no wheezing, no crackles. Normal respiratory effort. No accessory muscle use.  Cardiovascular: Regular rate and rhythm, no murmurs / rubs / gallops. No extremity edema. 2+ pedal pulses. No carotid bruits.  Abdomen: no tenderness, no masses palpated. No hepatosplenomegaly. Bowel sounds positive.  Musculoskeletal: no clubbing / cyanosis. No joint deformity upper and lower extremities. Good ROM, no contractures. Normal muscle tone.  Skin: no rashes, lesions, ulcers. No induration Neurologic: CN 2-12 grossly intact. Sensation intact, DTR normal. Strength 5/5 in all 4.  Psychiatric: Normal judgment and insight. Alert and oriented x 3. Normal mood.    Labs on Admission: I have personally reviewed following labs and imaging studies  CBC: Recent Labs  Lab 12/16/22 1100 12/16/22 1107  WBC 5.2  --   NEUTROABS 3.1  --   HGB 12.3 11.9*  HCT 38.1 35.0*  MCV 93.4  --   PLT 132*  --    Basic Metabolic Panel: Recent Labs  Lab 12/16/22 1100 12/16/22 1107  NA 138 138  K 4.0 3.8  CL 106 103  CO2 21*  --   GLUCOSE 112* 110*  BUN 16 18  CREATININE 1.15* 1.10*  CALCIUM 8.5*  --    GFR: CrCl cannot be calculated (Unknown ideal weight.). Liver Function Tests: Recent Labs   Lab 12/16/22 1100  AST 27  ALT 12  ALKPHOS 74  BILITOT 0.5  PROT 6.1*  ALBUMIN 3.2*   No results for input(s): "LIPASE", "AMYLASE" in the last 168 hours. No results for input(s): "AMMONIA" in the last 168 hours. Coagulation Profile: Recent Labs  Lab 12/16/22 1100  INR 1.0   Cardiac Enzymes: No results for input(s): "CKTOTAL", "CKMB", "CKMBINDEX", "TROPONINI" in the last 168 hours. BNP (last 3 results) No results for input(s): "PROBNP" in the last 8760 hours. HbA1C: No results for input(s): "HGBA1C" in the last 72 hours. CBG: Recent Labs  Lab 12/16/22 1103  GLUCAP 117*   Lipid Profile: No results for input(s): "CHOL", "HDL", "LDLCALC", "TRIG", "CHOLHDL", "LDLDIRECT" in the last 72 hours. Thyroid Function Tests: No results for input(s): "TSH", "T4TOTAL", "FREET4", "T3FREE", "THYROIDAB" in the last 72 hours. Anemia Panel: No results for input(s): "VITAMINB12", "FOLATE", "FERRITIN", "TIBC", "IRON", "RETICCTPCT" in the last 72 hours. Urine analysis:    Component Value Date/Time   COLORURINE YELLOW 10/06/2014 1534   APPEARANCEUR CLEAR 10/06/2014 1534   LABSPEC 1.011 10/06/2014 1534   PHURINE 5.5 10/06/2014 1534   GLUCOSEU NEG 10/06/2014 1534   HGBUR SMALL (A) 10/06/2014 1534   BILIRUBINUR NEG 10/06/2014 1534   KETONESUR NEG 10/06/2014 1534   PROTEINUR NEG 10/06/2014 1534   UROBILINOGEN 0.2 10/06/2014 1534   NITRITE NEG 10/06/2014 1534   LEUKOCYTESUR NEG 10/06/2014 1534    Radiological Exams on Admission: CT ANGIO HEAD NECK W WO CM W PERF (CODE STROKE)  Result Date: 12/16/2022 CLINICAL DATA:  Provided history: Neuro deficit, acute, stroke suspected. Difficulty speaking. EXAM: CT ANGIOGRAPHY HEAD AND NECK CT PERFUSION BRAIN TECHNIQUE: Multidetector CT imaging of the head and neck was performed using the standard protocol during bolus administration of intravenous contrast. Multiplanar CT image reconstructions and MIPs were obtained to evaluate the vascular anatomy.  Carotid stenosis measurements (when applicable) are obtained utilizing NASCET criteria, using the distal internal carotid diameter as the denominator. Multiphase CT imaging of the brain was performed following IV bolus contrast injection. Subsequent parametric perfusion maps were calculated using RAPID software. RADIATION DOSE REDUCTION: This exam was  performed according to the departmental dose-optimization program which includes automated exposure control, adjustment of the mA and/or kV according to patient size and/or use of iterative reconstruction technique. CONTRAST:  OMNIPAQUE IOHEXOL 350 MG/ML SOLN COMPARISON:  Noncontrast head CT performed earlier today 12/15/2021. FINDINGS: CTA NECK FINDINGS Aortic arch: Atherosclerotic plaque within the visualized thoracic aorta and proximal major branch vessels of the neck. No hemodynamically significant innominate or proximal subclavian artery stenosis. Right carotid system: CCA and ICA patent within the neck. Atherosclerotic plaque within the CCA and about the carotid bifurcation without measurable stenosis. Left carotid system: CCA and ICA patent within the neck without stenosis. Mild atherosclerotic plaque within the distal CCA and about the carotid bifurcation. Vertebral arteries: Vertebral arteries patent within the neck. The left vertebral artery is strongly dominant. As sclerotic plaque scattered within the cervical left vertebral artery with no more than mild stenosis. Skeleton: Cervical spondylosis.  Poor dentition. Other neck: Prior thyroidectomy. No neck mass or cervical lymphadenopathy. Upper chest: No consolidation within the imaged lung apices. Review of the MIP images confirms the above findings CTA HEAD FINDINGS Anterior circulation: The intracranial internal carotid arteries are patent. Nonstenotic atherosclerotic plaque within both vessels. The M1 middle cerebral arteries are patent. No M2 proximal branch occlusion or high-grade proximal  stenosis. The anterior cerebral arteries are patent. 2 mm medially projecting vascular protrusion arising from the paraclinoid right internal carotid artery compatible with a small aneurysm (series 5, image 103). 2 mm posteroinferiorly projecting vascular protrusion arising from the paraclinoid left internal carotid artery, may reflect an infundibulum or aneurysm. Posterior circulation: The right vertebral artery is developmentally diminutive beyond the PICA origin, but patent. The dominant left vertebral artery is patent intracranially without stenosis. The basilar artery is patent. The posterior cerebral arteries are patent. The right PCA is fetal in origin. The left posterior communicating artery is diminutive or absent. Venous sinuses: Within the limitations of contrast timing, no convincing thrombus. Anatomic variants: As described. Review of the MIP images confirms the above findings CT Brain Perfusion Findings: CBF (<30%) Volume: 0mL Perfusion (Tmax>6.0s) volume: 8mL Mismatch Volume: 8mL Infarction Location:None identified No emergent large vessel occlusion identified. This result, and the CT perfusion head results, were called to Dr. Selina Cooley at 12 p.m. on 12/16/2022. IMPRESSION: CTA neck: 1. The common carotid and internal carotid arteries are patent within the neck without stenosis. Mild atherosclerotic plaque bilaterally, as described. 2. The vertebral arteries are patent within the neck. Atherosclerotic plaque within the dominant left vertebral artery with no more than mild stenosis. CTA head: 1. No intracranial large vessel occlusion or proximal high-grade arterial stenosis identified. 2. Non-stenotic atherosclerotic plaque within the intracranial ICAs bilaterally. 3. 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 4. 2 mm posteroinferiorly projecting vascular protrusion arising from the paraclinoid left ICA, which may reflect an infundibulum or aneurysm. CT perfusion head: The  perfusion software identifies an 8 mL region of hypoperfused parenchyma within the left frontal lobe (MCA vascular territory). The perfusion software identifies no core infarct. Reported mismatch volume: 8 mL. Electronically Signed   By: Jackey Loge D.O.   On: 12/16/2022 12:07   CT HEAD CODE STROKE WO CONTRAST  Result Date: 12/16/2022 CLINICAL DATA:  Code stroke. Neuro deficit, acute, stroke suspected. EXAM: CT HEAD WITHOUT CONTRAST TECHNIQUE: Contiguous axial images were obtained from the base of the skull through the vertex without intravenous contrast. RADIATION DOSE REDUCTION: This exam was performed according to the departmental dose-optimization program which includes  automated exposure control, adjustment of the mA and/or kV according to patient size and/or use of iterative reconstruction technique. COMPARISON:  Head CT 04/22/2022. FINDINGS: Brain: Mild generalized parenchymal atrophy. Patchy and ill-defined hypoattenuation within the cerebral white matter, nonspecific but compatible with mild chronic small vessel ischemic disease. There is no acute intracranial hemorrhage. No demarcated cortical infarct. No extra-axial fluid collection. No evidence of an intracranial mass. No midline shift. Vascular: No hyperdense vessel.  Atherosclerotic calcifications. Skull: No fracture or aggressive osseous lesion. Sinuses/Orbits: No mass or acute finding within the imaged orbits. No significant paranasal sinus disease. ASPECTS ([place] Stroke Program Early CT Score) - Ganglionic level infarction (caudate, lentiform nuclei, internal capsule, insula, M1-M3 cortex): 7 - Supraganglionic infarction (M4-M6 cortex): 3 Total score (0-10 with 10 being normal): 10 These results were communicated to Dr. Selina Cooley at 11:25 amon 5/20/2024by text page via the [facility] messaging system. IMPRESSION: 1. No evidence of an acute intracranial abnormality. 2. Mild chronic small vessel ischemic changes within the cerebral white matter. 3. Mild  generalized parenchymal atrophy. Electronically Signed   By: Jackey Loge D.O.   On: 12/16/2022 11:25    EKG: Independently reviewed.  Sinus rhythm, frequent PVCs  Assessment/Plan Principal Problem:   Stroke (cerebrum) [facility]) Active Problems:   Hypothyroidism   NSVT (nonsustained ventricular tachycardia) (HCC)   S/P cardiac pacemaker procedure 05/11/14 [employer]tr[employer]   Sick sinus syndrome (HCC)  (please populate well all problems here in Problem List. (For example, if patient is on BP meds at home and you resume or decide to hold them, it is a problem that needs to be her. Same for CAD, COPD, HLD and so on)  Acute expressive aphasia -Suspect stroke suspected stroke, not a candidate for TNK as out of window -MRI pending -Telemonitoring for 24 hours to rule out A-fib.  Confirmed manage patient to send that patient has no history of A-fib. -Echocardiogram -Speech evaluation, PT OT evaluation -Agree with aspirin Plavix regimen, lipid panel pending -Allow permissive hypertension for 48 hours -Other DDx, UA negative for UTI.  Neurology consultation appreciated who recommend EEG to rule out seizure  HTN -Hold off ARB, chlorthalidone -As needed hydralazine  Frequent PVCs -Continue metoprolol  PPM -No acute concern  Hypothyroidism -Check TSH, continue Synthroid  DVT prophylaxis: Lovenox Code Status: Full code Family Communication: Son at bedside Disposition Plan: Expect less than 2 midnight hospital stay Consults called: Neurology Admission status: Telemetry observation   Emeline General MD [facility] Pager [phone]  12/16/2022, 1:22 PM

## 2022-12-16 NOTE — ED Notes (Signed)
ED TO INPATIENT HANDOFF REPORT  ED Nurse Name and Phone #: Vernona Rieger [phone]  S Name/Age/Gender Michelle Winters 89 y.o. female Room/Bed: 045C/045C  Code Status   Code Status: Full Code  Home/SNF/Other Home Patient oriented to: self, place, time, and situation Is this baseline? Yes   Triage Complete: Triage complete  Chief Complaint Stroke (cerebrum) [facility]) [I63.9]  Triage Note Pt BIBGEMS for a code stroke. Pt last know well 2100 on 12/15/2022. PT is aphasic with intermittent ability to follow commands. Pt has no complaints otherwise.   Initial BP manual 218/84 to 160/108 Hr 58  Cbg 136   Allergies Allergies  Allergen Reactions   Lisinopril Other (See Comments)    Chest tightness   Simvastatin Other (See Comments)    Muscle pain   Welchol [Colesevelam Hcl] Other (See Comments)    Muscle pain    Amiodarone Other (See Comments)    Lowered heart rate    Ciprofloxacin Nausea Only   Zetia [Ezetimibe] Nausea Only    Level of Care/Admitting Diagnosis ED Disposition     ED Disposition  Admit   Condition  --   Comment  Hospital Area: [facility] [[id number]]  Level of Care: Telemetry Medical [104]  May place patient in observation at [facility] or [facility] if equivalent level of care is available:: No  Covid Evaluation: Asymptomatic - no recent exposure (last 10 days) testing not required  Diagnosis: Stroke (cerebrum) [facility]) [[id number]]  Admitting Physician: Emeline General [[id number]]  Attending Physician: Emeline General [[id number]]          B Medical/Surgery History Past Medical History:  Diagnosis Date   Arthritis    "hands, fingers, toes" (05/11/2014)   Bradycardia    CAD (coronary artery disease)    a. cath 07/01/13: LM no obs dz, LAD widely patent no obs dz, LCx normal in appearance, RCA  small, nondominant vessel. Catheter-induced spasm at the ostium, EF 50-55%   Cancer (HCC)    Cardiomyopathy (HCC)    a. Echo 10/15: Inferior and inferolateral  AK,  Anterolateral HK, EF 40%;  b.  Echo (1/16):  EF 55-60%, Gr 1 DD, PASP 27, mild to mod TR, trivial AI, mild MR   Chronic kidney disease (CKD), stage III (moderate) (HCC)    Cystitis 03/29/1949   "tube to bladder was in a kink & I had polyps; surgically corrected"   DVT (deep venous thrombosis) (HCC)    Heart murmur    "slight"   Heart palpitations    a. paroxysmal SVT, PVCs, NSVT. b. echo 03/27/14: EF 50-55%, nl systolic function, AK of basalinferior myocardium, possible HK of the apicallateral and inferolateral myocardium, Ao trivial regurg, mild MR, mild LAE, trivial pulmonic regurg   Hypertension    Hypothyroidism    Osteopenia 09/27/2015   T score -2.4 FRAX 17%/6.5%   Pacemaker    a.  Medtronic MRI compatible ADVISA pulse generator serial number [id number].   Thyroid nodule    Wide-complex tachycardia 03/25/2014   Past Surgical History:  Procedure Laterality Date   CARDIAC CATHETERIZATION  06/2013   normal coronary arteries   CATARACT EXTRACTION W/ INTRAOCULAR LENS  IMPLANT, BILATERAL Bilateral    CHOLECYSTECTOMY     DILATION AND CURETTAGE OF UTERUS     HYSTEROSCOPY  1950's   "tube to bladder was in a kink & I had polyps; surgically corrected"   LEFT HEART CATHETERIZATION WITH CORONARY ANGIOGRAM [name] 07/01/2013   Procedure: LEFT HEART CATHETERIZATION WITH CORONARY  Rosalin Hawking;  Surgeon: Micheline Chapman, MD;  Location: [facility] CATH LAB;  Service: Cardiovascular;  Laterality: [name];   PACEMAKER INSERTION  05/11/2014   MDT dual chamber Advisa pacemaker implanted by Dr Graciela Husbands -- MRI compatible system   PERMANENT PACEMAKER INSERTION [name] 05/11/2014   Procedure: PERMANENT PACEMAKER INSERTION;  Surgeon: Duke Salvia, MD;  Location: [facility] CATH LAB;  Service: Cardiovascular;  Laterality: [name];   Thyroid nodule biopsy     THYROIDECTOMY [name] 01/26/2016   Procedure: TOTAL THYROIDECTOMY;  Surgeon: Darnell Level, MD;  Location: [facility] OR;  Service: General;  Laterality: [name];   TONSILLECTOMY     URETER  SURGERY Left many years ago? 1960     A IV Location/Drains/Wounds Patient Lines/Drains/Airways Status     Active Line/Drains/Airways     Name Placement date Placement time Site Days   Peripheral IV 12/16/22 20 G Anterior;Proximal;Right Forearm 12/16/22  1105  Forearm  less than 1   Peripheral IV 12/16/22 20 G Other (Comment) Right Antecubital 12/16/22  1110  Antecubital  less than 1   Incision (Closed) 05/11/14 Chest Left;Upper 05/11/14  1515  -- 3141   Incision (Closed) 01/26/16 Neck Other (Comment) 01/26/16  1026  -- 2516            Intake/Output Last 24 hours  Intake/Output Summary (Last 24 hours) at 12/16/2022 1846 Last data filed at 12/16/2022 1441 Gross per 24 hour  Intake 3 ml  Output 300 ml  Net -297 ml    Labs/Imaging Results for orders placed or performed during the hospital encounter of 12/16/22 (from the past 48 hour(s))  Ethanol     Status: None   Collection Time: 12/16/22 10:58 AM  Result Value Ref Range   Alcohol, Ethyl (B) <10 <10 mg/dL    Comment: (NOTE) Lowest detectable limit for serum alcohol is 10 mg/dL.  For medical purposes only. Performed at [facility], [address] [place], [place] [address]   Protime-INR     Status: None   Collection Time: 12/16/22 11:00 AM  Result Value Ref Range   Prothrombin Time 13.7 11.4 - 15.2 seconds   INR 1.0 0.8 - 1.2    Comment: (NOTE) INR goal varies based on device and disease states. Performed at [facility], [address] [place], [place] [address]   APTT     Status: None   Collection Time: 12/16/22 11:00 AM  Result Value Ref Range   aPTT 24 24 - 36 seconds    Comment: Performed at [facility], [address] [place], [place] [address]  CBC     Status: Abnormal   Collection Time: 12/16/22 11:00 AM  Result Value Ref Range   WBC 5.2 4.0 - 10.5 K/uL   RBC 4.08 3.87 - 5.11 MIL/uL   Hemoglobin 12.3 12.0 - 15.0 g/dL   HCT 91.4 78.2 - 95.6 %   MCV 93.4 80.0 - 100.0 fL   MCH  30.1 26.0 - 34.0 pg   MCHC 32.3 30.0 - 36.0 g/dL   RDW 21.3 08.6 - 57.8 %   Platelets 132 (L) 150 - 400 K/uL   nRBC 0.0 0.0 - 0.2 %    Comment: Performed at [facility], [address] [place], [place] [address]  Differential     Status: None   Collection Time: 12/16/22 11:00 AM  Result Value Ref Range   Neutrophils Relative % 59 %   Neutro Abs 3.1 1.7 - 7.7 K/uL  Lymphocytes Relative 26 %   Lymphs Abs 1.4 0.7 - 4.0 K/uL   Monocytes Relative 10 %   Monocytes Absolute 0.5 0.1 - 1.0 K/uL   Eosinophils Relative 4 %   Eosinophils Absolute 0.2 0.0 - 0.5 K/uL   Basophils Relative 1 %   Basophils Absolute 0.0 0.0 - 0.1 K/uL   Immature Granulocytes 0 %   Abs Immature Granulocytes 0.02 0.00 - 0.07 K/uL    Comment: Performed at [facility], [address], [place], [place] [address]  Comprehensive metabolic panel     Status: Abnormal   Collection Time: 12/16/22 11:00 AM  Result Value Ref Range   Sodium 138 135 - 145 mmol/L   Potassium 4.0 3.5 - 5.1 mmol/L   Chloride 106 98 - 111 mmol/L   CO2 21 (L) 22 - 32 mmol/L   Glucose, Bld 112 (H) 70 - 99 mg/dL    Comment: Glucose reference range applies only to samples taken after fasting for at least 8 hours.   BUN 16 8 - 23 mg/dL   Creatinine, Ser 9.52 (H) 0.44 - 1.00 mg/dL   Calcium 8.5 (L) 8.9 - 10.3 mg/dL   Total Protein 6.1 (L) 6.5 - 8.1 g/dL   Albumin 3.2 (L) 3.5 - 5.0 g/dL   AST 27 15 - 41 U/L   ALT 12 0 - 44 U/L   Alkaline Phosphatase 74 38 - 126 U/L   Total Bilirubin 0.5 0.3 - 1.2 mg/dL   GFR, Estimated 46 (L) >60 mL/min    Comment: (NOTE) Calculated using the CKD-EPI Creatinine Equation (2021)    Anion gap 11 5 - 15    Comment: Performed at [facility], [address] [place], [place] [address]  TSH     Status: None   Collection Time: 12/16/22 11:00 AM  Result Value Ref Range   TSH 1.635 0.350 - 4.500 uIU/mL    Comment: Performed by a 3rd Generation assay with a functional sensitivity of <=0.01  uIU/mL. Performed at [facility], [address], [place], [place] [address]   CBG monitoring, ED     Status: Abnormal   Collection Time: 12/16/22 11:03 AM  Result Value Ref Range   Glucose-Capillary 117 (H) 70 - 99 mg/dL    Comment: Glucose reference range applies only to samples taken after fasting for at least 8 hours.  I-stat chem 8, ED     Status: Abnormal   Collection Time: 12/16/22 11:07 AM  Result Value Ref Range   Sodium 138 135 - 145 mmol/L   Potassium 3.8 3.5 - 5.1 mmol/L   Chloride 103 98 - 111 mmol/L   BUN 18 8 - 23 mg/dL   Creatinine, Ser 2.72 (H) 0.44 - 1.00 mg/dL   Glucose, Bld 536 (H) 70 - 99 mg/dL    Comment: Glucose reference range applies only to samples taken after fasting for at least 8 hours.   Calcium, Ion 1.10 (L) 1.15 - 1.40 mmol/L   TCO2 24 22 - 32 mmol/L   Hemoglobin 11.9 (L) 12.0 - 15.0 g/dL   HCT 64.4 (L) 03.4 - 74.2 %  Urinalysis, Routine w reflex microscopic -Urine, Clean Catch     Status: Abnormal   Collection Time: 12/16/22  3:30 PM  Result Value Ref Range   Color, Urine STRAW (A) YELLOW   APPearance CLEAR CLEAR   Specific Gravity, Urine 1.014 1.005 - 1.030   pH 7.0 5.0 - 8.0   Glucose,  UA NEGATIVE NEGATIVE mg/dL   Hgb urine dipstick MODERATE (A) NEGATIVE   Bilirubin Urine NEGATIVE NEGATIVE   Ketones, ur NEGATIVE NEGATIVE mg/dL   Protein, ur NEGATIVE NEGATIVE mg/dL   Nitrite NEGATIVE NEGATIVE   Leukocytes,Ua NEGATIVE NEGATIVE   RBC / HPF 0-5 0 - 5 RBC/hpf   WBC, UA 0-5 0 - 5 WBC/hpf   Bacteria, UA NONE SEEN NONE SEEN   Squamous Epithelial / HPF 0-5 0 - 5 /HPF    Comment: Performed at [facility], [address], [place], [place] [address]   EEG adult  Result Date: 12/16/2022 Charlsie Quest, MD     12/16/2022  5:08 PM Patient Name: Michelle Winters MRN: [id number] Epilepsy Attending: Charlsie Quest Referring Physician/Provider: Marjorie Smolder, NP Date: 12/16/2022 Duration: 22.40 mins Patient history: 89 year old  patient with aphasia which started this morning. EEG to evaluate for seizure. Level of alertness: Awake, drowsy AEDs during EEG study: None Technical aspects: This EEG study was done with scalp electrodes positioned according to the 10-20 International system of electrode placement. Electrical activity was reviewed with band pass filter of 1-70Hz , sensitivity of 7 uV/mm, display speed of 12mm/sec with a 60Hz  notched filter applied as appropriate. EEG data were recorded continuously and digitally stored.  Video monitoring was available and reviewed as appropriate. Description: The posterior dominant rhythm consists of 9 Hz activity of moderate voltage (25-35 uV) seen predominantly in posterior head regions, symmetric and reactive to eye opening and eye closing. Drowsiness was characterized by attenuation of the posterior background rhythm. EEG showed continuous 3 to 5 Hz theta-delta slowing in left frontotemporal region. Hyperventilation and photic stimulation were not performed.   ABNORMALITY - Continuous slow, left frontotemporal region IMPRESSION: This study is suggestive of cortical dysfunction arising from left frontotemporal region, likely secondary to underlying structural abnormality, post-ictal state. No seizures or epileptiform discharges were seen throughout the recording. Priyanka Annabelle Harman   CT ANGIO HEAD NECK W WO CM W PERF (CODE STROKE)  Result Date: 12/16/2022 CLINICAL DATA:  Provided history: Neuro deficit, acute, stroke suspected. Difficulty speaking. EXAM: CT ANGIOGRAPHY HEAD AND NECK CT PERFUSION BRAIN TECHNIQUE: Multidetector CT imaging of the head and neck was performed using the standard protocol during bolus administration of intravenous contrast. Multiplanar CT image reconstructions and MIPs were obtained to evaluate the vascular anatomy. Carotid stenosis measurements (when applicable) are obtained utilizing NASCET criteria, using the distal internal carotid diameter as the denominator.  Multiphase CT imaging of the brain was performed following IV bolus contrast injection. Subsequent parametric perfusion maps were calculated using RAPID software. RADIATION DOSE REDUCTION: This exam was performed according to the departmental dose-optimization program which includes automated exposure control, adjustment of the mA and/or kV according to patient size and/or use of iterative reconstruction technique. CONTRAST:  OMNIPAQUE IOHEXOL 350 MG/ML SOLN COMPARISON:  Noncontrast head CT performed earlier today 12/15/2021. FINDINGS: CTA NECK FINDINGS Aortic arch: Atherosclerotic plaque within the visualized thoracic aorta and proximal major branch vessels of the neck. No hemodynamically significant innominate or proximal subclavian artery stenosis. Right carotid system: CCA and ICA patent within the neck. Atherosclerotic plaque within the CCA and about the carotid bifurcation without measurable stenosis. Left carotid system: CCA and ICA patent within the neck without stenosis. Mild atherosclerotic plaque within the distal CCA and about the carotid bifurcation. Vertebral arteries: Vertebral arteries patent within the neck. The left vertebral artery is strongly dominant. As sclerotic plaque scattered within the cervical left vertebral  artery with no more than mild stenosis. Skeleton: Cervical spondylosis.  Poor dentition. Other neck: Prior thyroidectomy. No neck mass or cervical lymphadenopathy. Upper chest: No consolidation within the imaged lung apices. Review of the MIP images confirms the above findings CTA HEAD FINDINGS Anterior circulation: The intracranial internal carotid arteries are patent. Nonstenotic atherosclerotic plaque within both vessels. The M1 middle cerebral arteries are patent. No M2 proximal branch occlusion or high-grade proximal stenosis. The anterior cerebral arteries are patent. 2 mm medially projecting vascular protrusion arising from the paraclinoid right internal carotid artery  compatible with a small aneurysm (series 5, image 103). 2 mm posteroinferiorly projecting vascular protrusion arising from the paraclinoid left internal carotid artery, may reflect an infundibulum or aneurysm. Posterior circulation: The right vertebral artery is developmentally diminutive beyond the PICA origin, but patent. The dominant left vertebral artery is patent intracranially without stenosis. The basilar artery is patent. The posterior cerebral arteries are patent. The right PCA is fetal in origin. The left posterior communicating artery is diminutive or absent. Venous sinuses: Within the limitations of contrast timing, no convincing thrombus. Anatomic variants: As described. Review of the MIP images confirms the above findings CT Brain Perfusion Findings: CBF (<30%) Volume: 0mL Perfusion (Tmax>6.0s) volume: 8mL Mismatch Volume: 8mL Infarction Location:None identified No emergent large vessel occlusion identified. This result, and the CT perfusion head results, were called to Dr. Selina Cooley at 12 p.m. on 12/16/2022. IMPRESSION: CTA neck: 1. The common carotid and internal carotid arteries are patent within the neck without stenosis. Mild atherosclerotic plaque bilaterally, as described. 2. The vertebral arteries are patent within the neck. Atherosclerotic plaque within the dominant left vertebral artery with no more than mild stenosis. CTA head: 1. No intracranial large vessel occlusion or proximal high-grade arterial stenosis identified. 2. Non-stenotic atherosclerotic plaque within the intracranial ICAs bilaterally. 3. 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 4. 2 mm posteroinferiorly projecting vascular protrusion arising from the paraclinoid left ICA, which may reflect an infundibulum or aneurysm. CT perfusion head: The perfusion software identifies an 8 mL region of hypoperfused parenchyma within the left frontal lobe (MCA vascular territory). The perfusion software identifies  no core infarct. Reported mismatch volume: 8 mL. Electronically Signed   By: Jackey Loge D.O.   On: 12/16/2022 12:07   CT HEAD CODE STROKE WO CONTRAST  Result Date: 12/16/2022 CLINICAL DATA:  Code stroke. Neuro deficit, acute, stroke suspected. EXAM: CT HEAD WITHOUT CONTRAST TECHNIQUE: Contiguous axial images were obtained from the base of the skull through the vertex without intravenous contrast. RADIATION DOSE REDUCTION: This exam was performed according to the departmental dose-optimization program which includes automated exposure control, adjustment of the mA and/or kV according to patient size and/or use of iterative reconstruction technique. COMPARISON:  Head CT 04/22/2022. FINDINGS: Brain: Mild generalized parenchymal atrophy. Patchy and ill-defined hypoattenuation within the cerebral white matter, nonspecific but compatible with mild chronic small vessel ischemic disease. There is no acute intracranial hemorrhage. No demarcated cortical infarct. No extra-axial fluid collection. No evidence of an intracranial mass. No midline shift. Vascular: No hyperdense vessel.  Atherosclerotic calcifications. Skull: No fracture or aggressive osseous lesion. Sinuses/Orbits: No mass or acute finding within the imaged orbits. No significant paranasal sinus disease. ASPECTS [facility] Stroke Program Early CT Score) - Ganglionic level infarction (caudate, lentiform nuclei, internal capsule, insula, M1-M3 cortex): 7 - Supraganglionic infarction (M4-M6 cortex): 3 Total score (0-10 with 10 being normal): 10 These results were communicated to Dr. Selina Cooley at 11:25 amon 5/20/2024by  text page via the [facility] messaging system. IMPRESSION: 1. No evidence of an acute intracranial abnormality. 2. Mild chronic small vessel ischemic changes within the cerebral white matter. 3. Mild generalized parenchymal atrophy. Electronically Signed   By: Jackey Loge D.O.   On: 12/16/2022 11:25    Pending Labs Unresulted Labs (From admission,  onward)     Start     Ordered   12/17/22 0500  Lipid panel  (Labs)  Tomorrow morning,   R       Comments: Fasting    12/16/22 1159   12/17/22 0500  Hemoglobin A1c  (Labs)  Tomorrow morning,   R       Comments: To assess prior glycemic control    12/16/22 1159   12/17/22 0500  Lipid panel  (Labs)  Tomorrow morning,   R       Comments: Fasting    12/16/22 1321            Vitals/Pain Today's Vitals   12/16/22 1600 12/16/22 1637 12/16/22 1749 12/16/22 1815  BP: (!) 145/131  (!) 151/111 (!) 174/82  Pulse: 75  71 (!) 124  Resp: (!) 22  18 (!) 23  Temp:  98 F (36.7 C)    TempSrc:  Oral    SpO2: 100%  98% (!) 88%  Weight:      Height:      PainSc:  0-No pain 0-No pain     Isolation Precautions No active isolations  Medications Medications   stroke: early stages of recovery book (has no administration in time range)  aspirin chewable tablet 81 mg (81 mg Oral Given 12/16/22 1249)  clopidogrel (PLAVIX) tablet 75 mg (has no administration in time range)  metoprolol tartrate (LOPRESSOR) tablet 25 mg (has no administration in time range)  hydrALAZINE (APRESOLINE) injection 5 mg (has no administration in time range)  levothyroxine (SYNTHROID) tablet 125 mcg (has no administration in time range)   stroke: early stages of recovery book (has no administration in time range)  acetaminophen (TYLENOL) tablet 650 mg (has no administration in time range)    Or  acetaminophen (TYLENOL) 160 MG/5ML solution 650 mg (has no administration in time range)    Or  acetaminophen (TYLENOL) suppository 650 mg (has no administration in time range)  senna-docusate (Senokot-S) tablet 1 tablet (has no administration in time range)  enoxaparin (LOVENOX) injection 40 mg (40 mg Subcutaneous Not Given 12/16/22 1637)  sodium chloride flush (NS) 0.9 % injection 3 mL (3 mLs Intravenous Given 12/16/22 1100)  iohexol (OMNIPAQUE) 350 MG/ML injection 100 mL (100 mLs Intravenous Contrast Given 12/16/22 1118)   clopidogrel (PLAVIX) tablet 300 mg (300 mg Oral Given 12/16/22 1249)    Mobility walks with device     Focused Assessments Neuro Assessment Handoff:  Swallow screen pass? Yes  Cardiac Rhythm: Normal sinus rhythm NIH Stroke Scale  Dizziness Present: No Headache Present: No Interval: Shift assessment Level of Consciousness (1a.)   : Alert, keenly responsive LOC Questions (1b. )   : Answers both questions correctly LOC Commands (1c. )   : Performs both tasks correctly Best Gaze (2. )  : Normal Visual (3. )  : No visual loss Facial Palsy (4. )    : Minor paralysis Motor Arm, Left (5a. )   : No drift Motor Arm, Right (5b. ) : No drift Motor Leg, Left (6a. )  : No drift Motor Leg, Right (6b. ) : No drift Limb Ataxia (7. ): Absent Sensory (8. )  :  Normal, no sensory loss Best Language (9. )  : Severe aphasia Dysarthria (10. ): Mild-to-moderate dysarthria, patient slurs at least some words and, at worst, can be understood with some difficulty Extinction/Inattention (11.)   : No Abnormality Complete NIHSS TOTAL: 4 Last date known well: 12/15/22 Last time known well: 2100 Neuro Assessment: Exceptions to WDL Neuro Checks:   Initial (12/16/22 1130)  Has TPA been given? No If patient is a Neuro Trauma and patient is going to OR before floor call report to 4N Charge nurse: [phone] or [phone]   R Recommendations: See Admitting Provider Note  Report given to:   Additional Notes: Ambulates with a cane and stand-by. Still has expressive aphasia

## 2022-12-16 NOTE — Evaluation (Signed)
Occupational Therapy Evaluation Patient Details Name: Michelle Winters MRN: [id number] DOB: 1931-10-24 Today's Date: 12/16/2022   History of Present Illness 89 y.o. female presents to [facility] hospital on 12/16/2022 with sudden onset aphasia. CT perfusion head shows small penumbra in L MCA territory. PMH includes DVT, CAD, PPM, CKD, HTN, hypothyroidism.   Clinical Impression   Patient admitted for the diagnosis above.  PTA she lives at home with her younger son, who does work during the day.  Normally she uses a quad cane for mobility due to a fall, but needs no assist with ADL or iADL.  Patient continues to drive.  Currently some expressive and ? Occasional receptive aphasia appear to be the deficit.  Patient had a difficult time with sensory testing, thinking I was asking her to touch her nose.  OT is indicated in the acute setting to address deficits, and ensure a safe transition home.  No post acute OT is anticipated.  MRI pending as of OT eval.       Recommendations for follow up therapy are one component of a multi-disciplinary discharge planning process, led by the attending physician.  Recommendations may be updated based on patient status, additional functional criteria and insurance authorization.   Assistance Recommended at Discharge PRN  Patient can return home with the following Assist for transportation    Functional Status Assessment  Patient has had a recent decline in their functional status and demonstrates the ability to make significant improvements in function in a reasonable and predictable amount of time.  Equipment Recommendations  None recommended by OT    Recommendations for Other Services       Precautions / Restrictions Precautions Precautions: Fall Restrictions Weight Bearing Restrictions: No      Mobility Bed Mobility Overal bed mobility: Needs Assistance Bed Mobility: Supine to Sit, Sit to Supine     Supine to sit: Supervision Sit to supine:  Supervision        Transfers Overall transfer level: Needs assistance   Transfers: Sit to/from Stand, Bed to chair/wheelchair/BSC Sit to Stand: Supervision     Step pivot transfers: Supervision            Balance Overall balance assessment: Needs assistance Sitting-balance support: Feet unsupported Sitting balance-Leahy Scale: Good     Standing balance support: Single extremity supported Standing balance-Leahy Scale: Fair Standing balance comment: patient doesn't really use the quad cane for balance, no LOB noted.                           ADL either performed or assessed with clinical judgement   ADL       Grooming: Supervision/safety;Standing               Lower Body Dressing: Supervision/safety;Sit to/from stand   Toilet Transfer: Retail banker;Ambulation                   Vision Patient Visual Report: No change from baseline       Perception     Praxis      Pertinent Vitals/Pain Pain Assessment Pain Assessment: No/denies pain     Hand Dominance Right   Extremity/Trunk Assessment Upper Extremity Assessment Upper Extremity Assessment: Overall WFL for tasks assessed   Lower Extremity Assessment Lower Extremity Assessment: Defer to PT evaluation   Cervical / Trunk Assessment Cervical / Trunk Assessment: Normal   Communication Communication Communication: Expressive difficulties   Cognition Arousal/Alertness: Awake/alert Behavior During Therapy: [facility]  for tasks assessed/performed Overall Cognitive Status: Within Functional Limits for tasks assessed                                 General Comments: Occasional expressive difficulties, ? mild receptive at times.     General Comments   Watch BP.    Exercises     Shoulder Instructions      Home Living Family/patient expects to be discharged to:: Private residence Living Arrangements: Children Available Help at Discharge:  Family;Available PRN/intermittently Type of Home: House Home Access: Ramped entrance     Home Layout: One level     Bathroom Shower/Tub: Producer, television/film/video: Standard Bathroom Accessibility: Yes How Accessible: Accessible via walker Home Equipment: Wheelchair - manual;Cane - quad   Additional Comments: wheelchair is hisbands, who has passed      Prior Functioning/Environment Prior Level of Function : Independent/Modified Independent;Driving                        OT Problem List: Increased edema;Decreased safety awareness      OT Treatment/Interventions: Self-care/ADL training;Therapeutic activities;Balance training;Patient/family education;DME and/or AE instruction    OT Goals(Current goals can be found in the care plan section) Acute Rehab OT Goals Patient Stated Goal: Return home OT Goal Formulation: With patient Time For Goal Achievement: 12/30/22 Potential to Achieve Goals: Good ADL Goals Pt Will Perform Grooming: Independently;standing Pt Will Perform Lower Body Dressing: Independently;sit to/from stand Pt Will Transfer to Toilet: Independently;regular height toilet;ambulating  OT Frequency: Min 2X/week    Co-evaluation              AM-PAC OT "6 Clicks" Daily Activity     Outcome Measure Help from another person eating meals?: None Help from another person taking care of personal grooming?: A Little Help from another person toileting, which includes using toliet, bedpan, or urinal?: A Little Help from another person bathing (including washing, rinsing, drying)?: A Little Help from another person to put on and taking off regular upper body clothing?: None Help from another person to put on and taking off regular lower body clothing?: A Little 6 Click Score: 20   End of Session Equipment Utilized During Treatment: Gait belt Nurse Communication: Mobility status  Activity Tolerance: Patient tolerated treatment well Patient left: in  bed;with call bell/phone within reach;with family/visitor present  OT Visit Diagnosis: Cognitive communication deficit (R41.841) Symptoms and signs involving cognitive functions: Cerebral infarction                Time: 6045-4098 OT Time Calculation (min): 21 min Charges:  OT General Charges $OT Visit: 1 Visit OT Evaluation $OT Eval Moderate Complexity: 1 Mod  12/16/2022  RP, OTR/L  Acute Rehabilitation Services  Office:  [phone]   Suzanna Obey 12/16/2022, 4:51 PM

## 2022-12-16 NOTE — Procedures (Signed)
Patient Name: Michelle Winters  MRN: [id number]  Epilepsy Attending: Charlsie Quest  Referring Physician/Provider: Marjorie Smolder, NP  Date: 12/16/2022 Duration: 22.40 mins  Patient history: 89 year old patient with aphasia which started this morning. EEG to evaluate for seizure.   Level of alertness: Awake, drowsy  AEDs during EEG study: None  Technical aspects: This EEG study was done with scalp electrodes positioned according to the 10-20 International system of electrode placement. Electrical activity was reviewed with band pass filter of 1-70Hz , sensitivity of 7 uV/mm, display speed of 42mm/sec with a 60Hz  notched filter applied as appropriate. EEG data were recorded continuously and digitally stored.  Video monitoring was available and reviewed as appropriate.  Description: The posterior dominant rhythm consists of 9 Hz activity of moderate voltage (25-35 uV) seen predominantly in posterior head regions, symmetric and reactive to eye opening and eye closing. Drowsiness was characterized by attenuation of the posterior background rhythm. EEG showed continuous 3 to 5 Hz theta-delta slowing in left frontotemporal region. Hyperventilation and photic stimulation were not performed.     ABNORMALITY - Continuous slow, left frontotemporal region  IMPRESSION: This study is suggestive of cortical dysfunction arising from left frontotemporal region, likely secondary to underlying structural abnormality, post-ictal state. No seizures or epileptiform discharges were seen throughout the recording.  Michelle Winters

## 2022-12-16 NOTE — ED Notes (Signed)
Pt is able to find her words a little easier, but is still using wrong words at times when speaking. Ambulated safely to the bathroom with cane and stand-by assist.

## 2022-12-16 NOTE — ED Notes (Signed)
Pt used bedpan with no difficulties.

## 2022-12-16 NOTE — ED Provider Notes (Signed)
[place] EMERGENCY DEPARTMENT AT [facility] Provider Note   CSN: [id number] Arrival date & time: 12/16/22  1057  An emergency department physician performed an initial assessment on this suspected stroke patient at 1057.  History  Chief Complaint  Patient presents with   Code Stroke    Michelle Winters is a 89 y.o. female.  HPI Presents as a code stroke.  She arrives via EMS.  History is obtained by those individuals, patient has aphasia. Seeming the last seen normal was about 4 hours prior to ED arrival.  Per EMS report the patient has had difficulty with communication since they picked her up.  No reported hemodynamic instability aside from mild hypertension. Level 5 caveat secondary to acuity of condition.    Home Medications Prior to Admission medications   Medication Sig Start Date End Date Taking? Authorizing Provider  chlorthalidone (HYGROTON) 25 MG tablet Take 12.5 mg by mouth every morning. 11/25/21   [provider]  Cyanocobalamin (VITAMIN B-12 IJ) Inject 1 Dose as directed every 30 (thirty) days.    [provider]  ELIQUIS 5 MG TABS tablet Take 5 mg by mouth 2 (two) times daily. 11/04/21   [provider]  hydrochlorothiazide (HYDRODIURIL) 25 MG tablet Take 12.5 mg by mouth daily.     [provider]  levothyroxine (SYNTHROID, LEVOTHROID) 125 MCG tablet Take 125 mcg by mouth daily. 09/04/17   [provider]  loratadine (CLARITIN) 10 MG tablet Take 10 mg by mouth daily as needed for allergies.     [provider]  losartan (COZAAR) 50 MG tablet Take 1 tablet by mouth daily.    [provider]  metoprolol succinate (TOPROL-XL) 50 MG 24 hr tablet Take 50 mg by mouth daily. 11/24/21   [provider]  sodium chloride (OCEAN) 0.65 % SOLN nasal spray Place 1 spray into both nostrils as needed for congestion.    [provider]  telmisartan (MICARDIS) 40 MG tablet Take 40 mg by mouth  daily. 09/22/21   [provider]      Allergies    Lisinopril, Simvastatin, Welchol [colesevelam hcl], Amiodarone, Ciprofloxacin, and Zetia [ezetimibe]    Review of Systems   Review of Systems  Unable to perform ROS: Acuity of condition    Physical Exam Updated Vital Signs BP (!) 163/111   Pulse 87   Resp (!) 24   Wt 57.4 kg   SpO2 98%   BMI 21.72 kg/m  Physical Exam Vitals and nursing note reviewed.  Constitutional:      General: She is not in acute distress.    Appearance: She is well-developed.     Comments: Elderly female awake, alert, sitting upright on hospital gurney  HENT:     Head: Normocephalic and atraumatic.  Eyes:     Conjunctiva/sclera: Conjunctivae normal.  Cardiovascular:     Rate and Rhythm: Normal rate and regular rhythm.  Pulmonary:     Effort: Pulmonary effort is normal. No respiratory distress.     Breath sounds: Normal breath sounds. No stridor.  Abdominal:     General: There is no distension.  Skin:    General: Skin is warm and dry.  Neurological:     Mental Status: She is alert.     Cranial Nerves: No cranial nerve deficit.     Comments: Face is symmetric, speech is brief, inconsistent, with inappropriate responses to questions, generally.  Patient seemingly has difficulty following commands as well, specifically, though she does  grossly.  She does move all extremity spontaneously.   Psychiatric:        Mood and Affect: Mood normal.        Cognition and Memory: Cognition is impaired.     ED Results / Procedures / Treatments   Labs (all labs ordered are listed, but only abnormal results are displayed) Labs Reviewed  CBC - Abnormal; Notable for the following components:      Result Value   Platelets 132 (*)    All other components within normal limits  COMPREHENSIVE METABOLIC PANEL - Abnormal; Notable for the following components:   CO2 21 (*)    Glucose, Bld 112 (*)    Creatinine, Ser 1.15 (*)    Calcium 8.5 (*)    Total  Protein 6.1 (*)    Albumin 3.2 (*)    GFR, Estimated 46 (*)    All other components within normal limits  I-STAT CHEM 8, ED - Abnormal; Notable for the following components:   Creatinine, Ser 1.10 (*)    Glucose, Bld 110 (*)    Calcium, Ion 1.10 (*)    Hemoglobin 11.9 (*)    HCT 35.0 (*)    All other components within normal limits  CBG MONITORING, ED - Abnormal; Notable for the following components:   Glucose-Capillary 117 (*)    All other components within normal limits  PROTIME-INR  APTT  DIFFERENTIAL  ETHANOL    EKG EKG Interpretation  Date/Time:  Monday Dec 16 2022 11:33:30 EDT Ventricular Rate:  89 PR Interval:  249 QRS Duration: 114 QT Interval:  389 QTC Calculation: 474 R Axis:   19 Text Interpretation: ATRIAL PACED RHYTHM Confirmed by Gerhard Munch [id number]) on 12/16/2022 11:37:12 AM  Radiology CT ANGIO HEAD NECK W WO CM W PERF (CODE STROKE)  Result Date: 12/16/2022 CLINICAL DATA:  Provided history: Neuro deficit, acute, stroke suspected. Difficulty speaking. EXAM: CT ANGIOGRAPHY HEAD AND NECK CT PERFUSION BRAIN TECHNIQUE: Multidetector CT imaging of the head and neck was performed using the standard protocol during bolus administration of intravenous contrast. Multiplanar CT image reconstructions and MIPs were obtained to evaluate the vascular anatomy. Carotid stenosis measurements (when applicable) are obtained utilizing NASCET criteria, using the distal internal carotid diameter as the denominator. Multiphase CT imaging of the brain was performed following IV bolus contrast injection. Subsequent parametric perfusion maps were calculated using RAPID software. RADIATION DOSE REDUCTION: This exam was performed according to the departmental dose-optimization program which includes automated exposure control, adjustment of the mA and/or kV according to patient size and/or use of iterative reconstruction technique. CONTRAST:  OMNIPAQUE IOHEXOL 350 MG/ML SOLN COMPARISON:   Noncontrast head CT performed earlier today 12/15/2021. FINDINGS: CTA NECK FINDINGS Aortic arch: Atherosclerotic plaque within the visualized thoracic aorta and proximal major branch vessels of the neck. No hemodynamically significant innominate or proximal subclavian artery stenosis. Right carotid system: CCA and ICA patent within the neck. Atherosclerotic plaque within the CCA and about the carotid bifurcation without measurable stenosis. Left carotid system: CCA and ICA patent within the neck without stenosis. Mild atherosclerotic plaque within the distal CCA and about the carotid bifurcation. Vertebral arteries: Vertebral arteries patent within the neck. The left vertebral artery is strongly dominant. As sclerotic plaque scattered within the cervical left vertebral artery with no more than mild stenosis. Skeleton: Cervical spondylosis.  Poor dentition. Other neck: Prior thyroidectomy. No neck mass or cervical lymphadenopathy. Upper chest: No consolidation within the imaged lung apices. Review of the MIP images confirms  the above findings CTA HEAD FINDINGS Anterior circulation: The intracranial internal carotid arteries are patent. Nonstenotic atherosclerotic plaque within both vessels. The M1 middle cerebral arteries are patent. No M2 proximal branch occlusion or high-grade proximal stenosis. The anterior cerebral arteries are patent. 2 mm medially projecting vascular protrusion arising from the paraclinoid right internal carotid artery compatible with a small aneurysm (series 5, image 103). 2 mm posteroinferiorly projecting vascular protrusion arising from the paraclinoid left internal carotid artery, may reflect an infundibulum or aneurysm. Posterior circulation: The right vertebral artery is developmentally diminutive beyond the PICA origin, but patent. The dominant left vertebral artery is patent intracranially without stenosis. The basilar artery is patent. The posterior cerebral arteries are patent. The  right PCA is fetal in origin. The left posterior communicating artery is diminutive or absent. Venous sinuses: Within the limitations of contrast timing, no convincing thrombus. Anatomic variants: As described. Review of the MIP images confirms the above findings CT Brain Perfusion Findings: CBF (<30%) Volume: 0mL Perfusion (Tmax>6.0s) volume: 8mL Mismatch Volume: 8mL Infarction Location:None identified No emergent large vessel occlusion identified. This result, and the CT perfusion head results, were called to Dr. Selina Cooley at 12 p.m. on 12/16/2022. IMPRESSION: CTA neck: 1. The common carotid and internal carotid arteries are patent within the neck without stenosis. Mild atherosclerotic plaque bilaterally, as described. 2. The vertebral arteries are patent within the neck. Atherosclerotic plaque within the dominant left vertebral artery with no more than mild stenosis. CTA head: 1. No intracranial large vessel occlusion or proximal high-grade arterial stenosis identified. 2. Non-stenotic atherosclerotic plaque within the intracranial ICAs bilaterally. 3. 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 4. 2 mm posteroinferiorly projecting vascular protrusion arising from the paraclinoid left ICA, which may reflect an infundibulum or aneurysm. CT perfusion head: The perfusion software identifies an 8 mL region of hypoperfused parenchyma within the left frontal lobe (MCA vascular territory). The perfusion software identifies no core infarct. Reported mismatch volume: 8 mL. Electronically Signed   By: Jackey Loge D.O.   On: 12/16/2022 12:07   CT HEAD CODE STROKE WO CONTRAST  Result Date: 12/16/2022 CLINICAL DATA:  Code stroke. Neuro deficit, acute, stroke suspected. EXAM: CT HEAD WITHOUT CONTRAST TECHNIQUE: Contiguous axial images were obtained from the base of the skull through the vertex without intravenous contrast. RADIATION DOSE REDUCTION: This exam was performed according to the  departmental dose-optimization program which includes automated exposure control, adjustment of the mA and/or kV according to patient size and/or use of iterative reconstruction technique. COMPARISON:  Head CT 04/22/2022. FINDINGS: Brain: Mild generalized parenchymal atrophy. Patchy and ill-defined hypoattenuation within the cerebral white matter, nonspecific but compatible with mild chronic small vessel ischemic disease. There is no acute intracranial hemorrhage. No demarcated cortical infarct. No extra-axial fluid collection. No evidence of an intracranial mass. No midline shift. Vascular: No hyperdense vessel.  Atherosclerotic calcifications. Skull: No fracture or aggressive osseous lesion. Sinuses/Orbits: No mass or acute finding within the imaged orbits. No significant paranasal sinus disease. ASPECTS (Alberta Stroke Program Early CT Score) - Ganglionic level infarction (caudate, lentiform nuclei, internal capsule, insula, M1-M3 cortex): 7 - Supraganglionic infarction (M4-M6 cortex): 3 Total score (0-10 with 10 being normal): 10 These results were communicated to Dr. Selina Cooley at 11:25 amon 5/20/2024by text page via the [facility] messaging system. IMPRESSION: 1. No evidence of an acute intracranial abnormality. 2. Mild chronic small vessel ischemic changes within the cerebral white matter. 3. Mild generalized parenchymal atrophy. Electronically Signed   By:  Jackey Loge D.O.   On: 12/16/2022 11:25    Procedures Procedures    Medications Ordered in ED Medications  sodium chloride flush (NS) 0.9 % injection 3 mL (has no administration in time range)   stroke: early stages of recovery book (has no administration in time range)  aspirin chewable tablet 81 mg (has no administration in time range)  clopidogrel (PLAVIX) tablet 75 mg (has no administration in time range)  clopidogrel (PLAVIX) tablet 300 mg (has no administration in time range)  iohexol (OMNIPAQUE) 350 MG/ML injection 100 mL (100 mLs Intravenous  Contrast Given 12/16/22 1118)    ED Course/ Medical Decision Making/ A&P                             Medical Decision Making Patient presents with change in neurologic function onset possibly about 4 hours ago. Patient's evaluation conducted as a code stroke with our neurology colleagues. With expeditious evaluation including labs, CT, monitoring. Cardiac 85 atrial paced abnormal Pulse ox 100% room air normal   Amount and/or Complexity of Data Reviewed Independent Historian: EMS External Data Reviewed: notes. Labs: ordered. Decision-making details documented in ED Course. Radiology: ordered and independent interpretation performed. Decision-making details documented in ED Course. ECG/medicine tests: ordered and independent interpretation performed. Decision-making details documented in ED Course.  Risk Prescription drug management. Decision regarding hospitalization.   12:32 PM I discussed patient's case with the neurology team.  Patient is awaiting MRI, with aphasia will be admitted to the internal medicine team for further monitoring, management of stroke. Initial labs noncontributory, physical exam otherwise reassuring.        Final Clinical Impression(s) / ED Diagnoses Final diagnoses:  Aphasia     Gerhard Munch, MD 12/16/22 1233

## 2022-12-16 NOTE — Progress Notes (Signed)
EEG complete - results pending 

## 2022-12-16 NOTE — ED Triage Notes (Signed)
Pt BIBGEMS for a code stroke. Pt last know well 2100 on 12/15/2022. PT is aphasic with intermittent ability to follow commands. Pt has no complaints otherwise.   Initial BP manual 218/84 to 160/108 Hr 58  Cbg 136

## 2022-12-17 ENCOUNTER — Observation Stay (HOSPITAL_COMMUNITY): Payer: Medicare Other

## 2022-12-17 DIAGNOSIS — I63312 Cerebral infarction due to thrombosis of left middle cerebral artery: Secondary | ICD-10-CM

## 2022-12-17 DIAGNOSIS — Z950 Presence of cardiac pacemaker: Secondary | ICD-10-CM | POA: Diagnosis not present

## 2022-12-17 DIAGNOSIS — I495 Sick sinus syndrome: Secondary | ICD-10-CM | POA: Diagnosis not present

## 2022-12-17 DIAGNOSIS — I639 Cerebral infarction, unspecified: Secondary | ICD-10-CM | POA: Diagnosis not present

## 2022-12-17 DIAGNOSIS — E039 Hypothyroidism, unspecified: Secondary | ICD-10-CM | POA: Diagnosis not present

## 2022-12-17 LAB — COMPREHENSIVE METABOLIC PANEL
ALT: 14 U/L (ref 0–44)
AST: 26 U/L (ref 15–41)
Albumin: 3.2 g/dL — ABNORMAL LOW (ref 3.5–5.0)
Alkaline Phosphatase: 76 U/L (ref 38–126)
Anion gap: 8 (ref 5–15)
BUN: 15 mg/dL (ref 8–23)
CO2: 25 mmol/L (ref 22–32)
Calcium: 8.7 mg/dL — ABNORMAL LOW (ref 8.9–10.3)
Chloride: 101 mmol/L (ref 98–111)
Creatinine, Ser: 1.14 mg/dL — ABNORMAL HIGH (ref 0.44–1.00)
GFR, Estimated: 46 mL/min — ABNORMAL LOW (ref >60)
Glucose, Bld: 112 mg/dL — ABNORMAL HIGH (ref 70–99)
Potassium: 4.1 mmol/L (ref 3.5–5.1)
Sodium: 134 mmol/L — ABNORMAL LOW (ref 135–145)
Total Bilirubin: 1.1 mg/dL (ref 0.3–1.2)
Total Protein: 6.2 g/dL — ABNORMAL LOW (ref 6.5–8.1)

## 2022-12-17 LAB — CBC WITH DIFFERENTIAL/PLATELET
Abs Immature Granulocytes: 0.01 10*3/uL (ref 0.00–0.07)
Basophils Absolute: 0.1 10*3/uL (ref 0.0–0.1)
Basophils Relative: 1 %
Eosinophils Absolute: 0.2 10*3/uL (ref 0.0–0.5)
Eosinophils Relative: 3 %
HCT: 34.1 % — ABNORMAL LOW (ref 36.0–46.0)
Hemoglobin: 11.5 g/dL — ABNORMAL LOW (ref 12.0–15.0)
Immature Granulocytes: 0 %
Lymphocytes Relative: 20 %
Lymphs Abs: 0.9 10*3/uL (ref 0.7–4.0)
MCH: 30.9 pg (ref 26.0–34.0)
MCHC: 33.7 g/dL (ref 30.0–36.0)
MCV: 91.7 fL (ref 80.0–100.0)
Monocytes Absolute: 0.4 10*3/uL (ref 0.1–1.0)
Monocytes Relative: 9 %
Neutro Abs: 3.1 10*3/uL (ref 1.7–7.7)
Neutrophils Relative %: 67 %
Platelets: 147 10*3/uL — ABNORMAL LOW (ref 150–400)
RBC: 3.72 MIL/uL — ABNORMAL LOW (ref 3.87–5.11)
RDW: 13 % (ref 11.5–15.5)
WBC: 4.6 10*3/uL (ref 4.0–10.5)
nRBC: 0 % (ref 0.0–0.2)

## 2022-12-17 LAB — HEMOGLOBIN A1C
Hgb A1c MFr Bld: 5.5 % (ref 4.8–5.6)
Mean Plasma Glucose: 111.15 mg/dL

## 2022-12-17 LAB — MAGNESIUM: Magnesium: 1.9 mg/dL (ref 1.7–2.4)

## 2022-12-17 LAB — LIPID PANEL
Cholesterol: 197 mg/dL (ref 0–200)
HDL: 55 mg/dL (ref >40)
LDL Cholesterol: 122 mg/dL — ABNORMAL HIGH (ref 0–99)
Total CHOL/HDL Ratio: 3.6 RATIO
Triglycerides: 102 mg/dL (ref <150)
VLDL: 20 mg/dL (ref 0–40)

## 2022-12-17 LAB — PHOSPHORUS: Phosphorus: 4.1 mg/dL (ref 2.5–4.6)

## 2022-12-17 NOTE — TOC Transition Note (Signed)
Transition of Care [facility]) - CM/SW Discharge Note   Patient Details  Name: Michelle Winters MRN: [id number] Date of Birth: 01-04-1932  Transition of Care [facility]) CM/SW Contact:  Kermit Balo, RN Phone Number: 12/17/2022, 11:09 AM   Clinical Narrative:    Pt is from home with her son.  DME at home: cane Pt has been driving some but has decided to let her son take that over.  She manage her own medications and denies any issues.  Home health ST arranged with [facility]. Information on the AVS.  Pt has transportation home when discharged.    Final next level of care: Home w Home Health Services Barriers to Discharge: No Barriers Identified   Patient Goals and CMS Choice CMS Medicare.gov Compare Post Acute Care list provided to:: Patient Choice offered to / list presented to : Patient  Discharge Placement                         Discharge Plan and Services Additional resources added to the After Visit Summary for                            [facility] Arranged: Speech Therapy HH Agency: [facility] Date [facility] Agency Contacted: 12/17/22   Representative spoke with at [facility] Agency: Kandee Keen  Social Determinants of Health (SDOH) Interventions SDOH Screenings   Food Insecurity: No Food Insecurity (12/16/2022)  Housing: Low Risk  (12/16/2022)  Transportation Needs: No Transportation Needs (12/16/2022)  Utilities: Not At Risk (12/16/2022)  Tobacco Use: Low Risk  (12/16/2022)     Readmission Risk Interventions     No data to display

## 2022-12-17 NOTE — Progress Notes (Addendum)
STROKE TEAM PROGRESS NOTE   INTERVAL HISTORY Her son is at the bedside.   Presented as a code stroke 5/20 due to expressive aphasia/word finding difficulty.  Patient's last known well was 2100 5/19.  TNK was not given due to being outside of the window.  Thrombectomy was not offered due to no LVO.  CTP does reveal small penumbra in the left MCA territory, MRI shows left frontal infarct. Patient was not currently on any anticoagulation at home.  Patient was on Eliquis for 3 months in 2023, due to left IJ DVT.  Vitals:   12/17/22 0020 12/17/22 0340 12/17/22 0832 12/17/22 1135  BP: (!) 143/64 133/67 121/67 132/73  Pulse: 69 71 70 70  Resp: 18 18 18 17   Temp: 98.3 F (36.8 C) 98 F (36.7 C) 98.7 F (37.1 C) 98.4 F (36.9 C)  TempSrc: Oral Oral Oral Oral  SpO2: 99% 98% 97% 98%  Weight:      Height:       CBC:  Recent Labs  Lab 12/16/22 1100 12/16/22 1107 12/17/22 1030  WBC 5.2  --  4.6  NEUTROABS 3.1  --  3.1  HGB 12.3 11.9* 11.5*  HCT 38.1 35.0* 34.1*  MCV 93.4  --  91.7  PLT 132*  --  147*   Basic Metabolic Panel:  Recent Labs  Lab 12/16/22 1100 12/16/22 1107 12/17/22 1158  NA 138 138 134*  K 4.0 3.8 4.1  CL 106 103 101  CO2 21*  --  25  GLUCOSE 112* 110* 112*  BUN 16 18 15   CREATININE 1.15* 1.10* 1.14*  CALCIUM 8.5*  --  8.7*  MG  --   --  1.9  PHOS  --   --  4.1   Lipid Panel:  Recent Labs  Lab 12/17/22 0625  CHOL 197  TRIG 102  HDL 55  CHOLHDL 3.6  VLDL 20  LDLCALC 130*   HgbA1c:  Recent Labs  Lab 12/17/22 0625  HGBA1C 5.5   Urine Drug Screen: No results for input(s): "LABOPIA", "COCAINSCRNUR", "LABBENZ", "AMPHETMU", "THCU", "LABBARB" in the last 168 hours.  Alcohol Level  Recent Labs  Lab 12/16/22 1058  ETH <10    IMAGING past 24 hours MR BRAIN WO CONTRAST  Result Date: 12/17/2022 CLINICAL DATA:  Stroke, follow up EXAM: MRI HEAD WITHOUT CONTRAST TECHNIQUE: Multiplanar, multiecho pulse sequences of the brain and surrounding structures  were obtained without intravenous contrast. COMPARISON:  CT head May 20, 24. FINDINGS: Brain: Acute left frontal infarct. Mild edema without mass effect. No evidence of acute hemorrhage, mass lesion, midline shift or hydrocephalus. Scattered T2/FLAIR hyperintensities in the white matter, nonspecific but compatible with chronic microvascular ischemic disease. Vascular: Major arterial flow voids are maintained skull base. Skull and upper cervical spine: Normal marrow signal. Sinuses/Orbits: Clear sinuses.  No acute orbital findings Other: No mastoid effusions. IMPRESSION: Acute left frontal infarct. Electronically Signed   By: Feliberto Harts M.D.   On: 12/17/2022 13:45   EEG adult  Result Date: 12/16/2022 Charlsie Quest, MD     12/16/2022  5:08 PM Patient Name: Michelle Winters MRN: [id number] Epilepsy Attending: Charlsie Quest Referring Physician/Provider: Marjorie Smolder, NP Date: 12/16/2022 Duration: 22.40 mins Patient history: 89 year old patient with aphasia which started this morning. EEG to evaluate for seizure. Level of alertness: Awake, drowsy AEDs during EEG study: None Technical aspects: This EEG study was done with scalp electrodes positioned according to the 10-20 International system of electrode placement.  Electrical activity was reviewed with band pass filter of 1-70Hz , sensitivity of 7 uV/mm, display speed of 56mm/sec with a 60Hz  notched filter applied as appropriate. EEG data were recorded continuously and digitally stored.  Video monitoring was available and reviewed as appropriate. Description: The posterior dominant rhythm consists of 9 Hz activity of moderate voltage (25-35 uV) seen predominantly in posterior head regions, symmetric and reactive to eye opening and eye closing. Drowsiness was characterized by attenuation of the posterior background rhythm. EEG showed continuous 3 to 5 Hz theta-delta slowing in left frontotemporal region. Hyperventilation and photic stimulation were  not performed.   ABNORMALITY - Continuous slow, left frontotemporal region IMPRESSION: This study is suggestive of cortical dysfunction arising from left frontotemporal region, likely secondary to underlying structural abnormality, post-ictal state. No seizures or epileptiform discharges were seen throughout the recording. Priyanka Annabelle Harman    PHYSICAL EXAM GENERAL: Awake, alert, in no acute distress Psych: Affect appropriate for situation, patient is calm and cooperative with examination Head: Normocephalic and atraumatic, without obvious abnormality EENT: Normal conjunctivae, moist mucous membranes, no OP obstruction LUNGS: Normal respiratory effort. Non-labored breathing on room air CV: Regular rate and rhythm on telemetry Extremities: warm, well perfused, without obvious deformity   NEURO:  Mental Status: Awake, alert, oriented to self, place, time, situation.  Follows commands, able to give recent and remote history, with occasional repetition.  Speech/Language: speech is clear. No aphasia.  No neglect is noted Cranial Nerves:  II: PERRL blinks to threat bilaterally III, IV, VI: EOMI. Lid elevation symmetric and full.  VII: Face is symmetric resting and smiling.  VIII: Hearing intact to voice IX, X: Phonation normal.  XII: Tongue protrudes midline without fasciculations.   Motor: Good antigravity strength in bilateral upper and lower extremities Tone is normal. Bulk is normal.  Sensation: Intact to light touch bilaterally in all four extremities.  Coordination: FTN intact bilaterally.  No pronator drift.  Gait: Deferred   NIHSS: 1a Level of Conscious.: 0 1b LOC Questions: 1 1c LOC Commands: 0 2 Best Gaze: 0 3 Visual: 0 4 Facial Palsy: 0 5a Motor Arm - left: 0 5b Motor Arm - Right: 0 6a Motor Leg - Left: 0 6b Motor Leg - Right: 0 7 Limb Ataxia: 0 8 Sensory: 0 9 Best Language: 0 10 Dysarthria: 0 11 Extinct. and Inatten.: 0 TOTAL: 1  ASSESSMENT/PLAN Ms. Michelle Winters is a 89 y.o. female with history of CAD, cardiomyopathy status post pacemaker, sick sinus syndrome, CKD stage III, hypertension, hypothyroidism, left IJ DVT 2023 presenting with aphasia and word finding difficulties.  Patient was out of the TNK window.  CTA showed no LVO, CTP revealed small penumbra in left MCA territory.  MRI revealed a left frontal infarct.  Acute left frontal infarct Etiology: Embolic from cryptogenic source code Stroke CT head  No acute abnormality.  Mild chronic Small vessel disease.  ASPECTS 10.    CTA head & neck  No intracranial LVO 2 mm aneurysm arising from the cavernous right ICA 2 mm projecting vascular protrusion arising from left ICA, possible aneurysm. CT perfusion  8 mm region of hyperperfused parenchyma within the left frontal lobe (MCA vascular territory)  MRI   Acute left frontal infarct  2D Echo: pending  LDL 122 HgbA1c 5.5 VTE prophylaxis - lovenox    Diet   Diet Heart Room service appropriate? Yes; Fluid consistency: Thin   No antithrombotic prior to admission, now on aspirin 81 mg daily and clopidogrel 75 mg  daily.  Recommend aspirin and Plavix for 3 weeks followed by aspirin for monotherapy. Therapy recommendations: No follow-up needed Disposition: Pending  Hypertension CAD Sick Sinus Syndrome s/p pacemaker Home meds: Losartan 50 mg, metoprolol 50 mg, Hygroton 25mg  Permissive hypertension (OK if < 220/120) but gradually normalize in 2-3 days Long-term BP goal normotensive No AF seen on pacemaker interrogation  Hx of DVT L IJ DVT 2023 Was on Eliquis x 3months Recommend aspirin and Plavix for 3 weeks followed by aspirin for monotherapy/secondary stroke prevention.   Hyperlipidemia Home meds:  none LDL 122, goal < 70 Patient states she has had issues when previously tried to take statins.  We discussed possibility of starting Leqvio. Paperwork will be completed.   Diabetes type II, no history Home meds:  none HgbA1c 5.5,  goal < 7.0 CBGs Recent Labs    12/16/22 1103  GLUCAP 117*    SSI  Other Stroke Risk Factors Advanced Age >/= 86  Hypothyroidism  Other Active Problems 3b CKD Cr 1.14  Hospital day # 0   Pt seen by Neuro NP/APP and later by MD. Note/plan to be edited by MD as needed.    Lynnae January, DNP, AGACNP-BC [facility] Please use AMION for contact information & EPIC for messaging. I have personally obtained history,examined this patient, reviewed notes, independently viewed imaging studies, participated in medical decision making and plan of care.ROS completed by me personally and pertinent positives fully documented  I have made any additions or clarifications directly to the above note. Agree with note above.  Patient presented with sudden onset of aphasia due to embolic left MCA branch infarct of cryptogenic etiology.  She has Medtronic pacemaker which needs to be interrogated to look for any paroxysmal A-fib.  Recommend aspirin and Plavix for 3 weeks followed by aspirin alone and aggressive risk factor modification.  Patient has history of statin intolerance and will need to consider starting Leqvio and will begin paperwork.  Discussion with patient and son and answered questions.  Greater than 50% time during this 50-minute visit was spent on counseling and coordination of care about her cryptogenic stroke and discussion about evaluation and treatment and answering questions.  Discussed with Dr. Chauncy Lean, MD Medical Director [facility] Stroke Center Pager: [phone] 12/17/2022 3:40 PM   To contact Stroke Continuity provider, please refer to [email or web address]. After hours, contact General Neurology

## 2022-12-17 NOTE — Evaluation (Signed)
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   Cardiomyopathy (HCC) 05/27/2014   Wide-complex tachycardia - presumed SVT 05/27/2014   S/P cardiac pacemaker procedure 05/11/14 Medtronic 05/13/2014   Pneumothorax on left 05/13/2014   Symptomatic bradycardia 05/11/2014   NSVT (nonsustained ventricular tachycardia) (HCC) 04/12/2014   SVT (supraventricular tachycardia) 03/25/2014   Near syncope 03/19/2014   Palpitations 03/19/2014   Hypothyroidism 03/19/2014   Syncope 12/16/2013   PVC (premature ventricular contraction) 06/17/2013   Abnormal nuclear cardiac imaging test 06/17/2013   Sinus bradycardia 06/17/2013   Hypertension    Past Medical History:  Past Medical History:  Diagnosis Date   Arthritis    "hands, fingers, toes" (05/11/2014)   Bradycardia    CAD (coronary artery disease)    a. cath 07/01/13: LM no obs dz, LAD widely patent no obs dz, LCx normal in appearance, RCA  small, nondominant vessel. Catheter-induced spasm at the ostium, EF 50-55%   Cancer (HCC)    Cardiomyopathy (HCC)    a. Echo 10/15: Inferior and inferolateral AK,  Anterolateral HK, EF 40%;  b.  Echo (1/16):  EF 55-60%, Gr 1 DD, PASP 27, mild to mod TR, trivial AI, mild MR   Chronic kidney disease (CKD), stage III (moderate) (HCC)    Cystitis 03/29/1949   "tube to bladder was in a kink & I had polyps; surgically corrected"   DVT (deep venous thrombosis) (HCC)    Heart murmur    "slight"   Heart palpitations    a. paroxysmal SVT, PVCs, NSVT. b. echo 03/27/14: EF 50-55%, nl systolic function, AK of  basalinferior myocardium, possible HK of the apicallateral and inferolateral myocardium, Ao trivial regurg, mild MR, mild LAE, trivial pulmonic regurg   Hypertension    Hypothyroidism    Osteopenia 09/27/2015   T score -2.4 FRAX 17%/6.5%   Pacemaker    a.  Medtronic MRI compatible ADVISA pulse generator serial number [id number].   Thyroid nodule    Wide-complex tachycardia 03/25/2014   Past Surgical History:  Past Surgical History:  Procedure Laterality Date   CARDIAC CATHETERIZATION  06/2013   normal coronary arteries   CATARACT EXTRACTION W/ INTRAOCULAR LENS  IMPLANT, BILATERAL Bilateral    CHOLECYSTECTOMY     DILATION AND CURETTAGE OF UTERUS     HYSTEROSCOPY  1950's   "tube to bladder was in a kink & I had polyps; surgically corrected"   LEFT HEART CATHETERIZATION WITH CORONARY ANGIOGRAM [name] 07/01/2013   Procedure: LEFT HEART CATHETERIZATION WITH CORONARY ANGIOGRAM;  Surgeon: Micheline Chapman, MD;  Location: [facility] CATH LAB;  Service: Cardiovascular;  Laterality: [name];   PACEMAKER INSERTION  05/11/2014   MDT dual chamber Advisa pacemaker implanted by Dr Graciela Husbands -- MRI compatible system   PERMANENT PACEMAKER INSERTION [name] 05/11/2014   Procedure: PERMANENT PACEMAKER INSERTION;  Surgeon: Duke Salvia, MD;  Location: [facility] CATH LAB;  Service: Cardiovascular;  Laterality: [name];   Thyroid nodule biopsy     THYROIDECTOMY [name] 01/26/2016   Procedure: TOTAL THYROIDECTOMY;  Surgeon: Darnell Level, MD;  Location: [facility] OR;  Service: General;  Laterality: [name];   TONSILLECTOMY  URETER SURGERY Left many years ago? 1960   HPI:  89 y.o. female presents to [facility] hospital on 12/16/2022 with sudden onset aphasia. CT perfusion head shows small penumbra in L MCA territory. PMH includes DVT, CAD, PPM, CKD, HTN, hypothyroidism.  EEG this study is suggestive of cortical dysfunction arising from left frontotemporal region, likely secondary to underlying structural abnormality, post-ictal state. No seizures or epileptiform  discharges were seen throughout the recording.    Pt has word finding deficits - expressive aphasia as well as high level receptive deficits for multistep directions.  She reports her language has improved.  Pt lives with her son and is active in the yard, manages bills, medications, etc.  She reports she will not give up driving as she does not feel safe driving.  Brain MRI pending   Assessment / Plan / Recommendation Clinical Impression  Patient presents with mild expressive and minimal receptive language deficits falling within category of anomic aphasia.  Western Aphasia Bedside Battery administered. Higher level receptive deficits noted with pt having difficulty following multi-step directions.  Repetition and reading is fully intact.  Writing mimics pt's verbal expression with semantic paraphasias noted. Novel communication/expression is halting a times due to word finding deficits.  Use of automatic speech tasks and verbal choice helpful to elicit proper word - even when pt is not aware.  Scoring of Aphasia Battery includes 7/10 content, fluency, 8/10, sequential commands 6/10, repetition 10/10, oject naming 9/10, reading 10/10, writing 4/10.  Fluecy score of 8, Auditory Verbal Comprehension score of 10 and Repetition score 10/10 result in anomic aphasia.  She will benefit from follow up SLP to address language given she is independent PTA and manages bills, medications, etc.  Using teach back, pt educated to findings.recommendations and SLP provided her with information re: apps for aphasia.    SLP Assessment  SLP Recommendation/Assessment: Patient needs continued Speech Lanaguage Pathology Services SLP Visit Diagnosis: Aphasia (R47.01)    Recommendations for follow up therapy are one component of a multi-disciplinary discharge planning process, led by the attending physician.  Recommendations may be updated based on patient status, additional functional criteria and insurance authorization.     Follow Up Recommendations  Follow physician's recommendations for discharge plan and follow up therapies (follow up at next venue of care)    Assistance Recommended at Discharge   SLP at next venue of care- pt reports she cannot drive herself and states her son works  Functional Status Assessment Patient has had a recent decline in their functional status and demonstrates the ability to make significant improvements in function in a reasonable and predictable amount of time.  Frequency and Duration min 1 x/week  1 week      SLP Evaluation Cognition  Overall Cognitive Status: Difficult to assess Arousal/Alertness: Awake/alert Orientation Level: Oriented X4 Year: 2024 Month: May Day of Week: Correct Memory: Appears intact Awareness: Appears intact Problem Solving: Appears intact Safety/Judgment: Appears intact Comments: pt reports she does not want to drive at this time due to concern for safety demonstrating excellent judgement       Comprehension  Auditory Comprehension Overall Auditory Comprehension: Impaired Yes/No Questions: Within Functional Limits Commands: Impaired Multistep Basic Commands: 50-74% accurate Conversation: Complex EffectiveTechniques: Repetition IT trainer Discrimination: Not tested Reading Comprehension Reading Status: Within funtional limits    Expression Expression Primary Mode of Expression: Verbal Verbal Expression Overall Verbal Expression: Appears within functional limits for tasks assessed Initiation: No impairment Repetition: No impairment Naming: No impairment Pragmatics: No impairment  Non-Verbal Means of Communication: Not applicable Written Expression Dominant Hand: Right Written Expression: Exceptions to [facility] Dictation Ability:  [facility]) Self Formulation Ability: Sentence (deficits with writing information re: picture description)   Oral / Motor  Oral Motor/Sensory Function Overall Oral Motor/Sensory Function:  Within functional limits Motor Speech Overall Motor Speech: Appears within functional limits for tasks assessed Respiration: Within functional limits Resonance: Within functional limits Articulation: Within functional limitis Intelligibility: Intelligible Motor Planning: Witnin functional limits Motor Speech Errors: Not applicable            Chales Abrahams 12/17/2022, 10:22 AM Rolena Infante, MS [facility] SLP Acute Rehab Services Office [phone]

## 2022-12-17 NOTE — Progress Notes (Signed)
PROGRESS NOTE    Michelle Winters  ZOX:[id number] DOB: [birth year] DOA: 12/16/2022 PCP: [facility]   Brief Narrative:  The patient is an 89 year old Caucasian female with a past medical history significant for but #2 hypertension, sick sinus syndrome status post permanent pacemaker, NSVT, CAD, cardiomyopathy with recovered LVEF as well as other comorbidities who was brought in by her family member for acute aphasia.  Symptoms started around 10 AM yesterday morning and patient was found to develop aphasia and the patient appeared to have no function in the event members and talking but when she talked it the family numbers felt it did not make any sense and she was talking "gibberish".  No new medications that she does not take and narcotics or sedative medications not taking aspirin and no history of afibrillation known.  On arrival she is afebrile and a code stroke CT was negative for any intracranial bleeding or other acute structural maladies.  CTA was done and showed hypoperfusion of the left frontal lobe.  She was started on aspirin and Plavix and MRI done and confirmed an acute left frontal infarct.  Neurology has been consulted and stroke workup is undergoing and currently echocardiogram is still pending.  Anticipating discharging home in next 24 to 40 hours after stroke workup is complete   Assessment and Plan: No notes have been filed under this hospital service. 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  Confirmed manage patient to send that patient has no history of A-fib. -Echocardiogram ordered and pending to be done  -Speech evaluation, PT OT evaluation recommending no follow-up -Continue with antiplatelet therapy with aspirin and Plavix for 3 weeks followed by monotherapy with aspirin 81 mg daily -Lipid panel done and showed a total cholesterol/HDL ratio 3.6, cholesterol level 197, HDL of 55, LDL 122, triglycerides of 811, VLDL 20 -Allow permissive hypertension for 48 hours -Other DDx, UA negative for UTI.   -Neurology consultation appreciated who recommend EEG to rule out seizure -EEG done and showed that the study was suggestive of cortical dysfunction arising from the left frontotemporal region likely secondary to underlying structural abnormality and postictal state no seizures or epileptiform discharges were seen throughout the recording -Further care per neurology and appreciate their assistance in management   HTN -Continue to Hold ARB and Chlorthalidone -C/w Metoprolol Tartrate  -C/w Hydralazine 5 mg IV q6hprn for SBP >200 or DBP >110 -Continue to monitor blood pressures per protocol -Last blood pressure reading was 120/91   Frequent PVCs -Continue Metoprolol Tartrate 25 mg po BID -Continue to Monitor on Telemetry   Hyponatremia -Mild. Sodium level trend: Recent Labs  Lab 12/16/22 1100 12/16/22 1107 12/17/22 1158  NA 138 138 134*  -Continue monitor and trend and repeat  CMP in a.m.   PPM -His have Med[employer]onic pacemaker be interrogated to look for any paroxysmal atrial fibrillation   Hypothyroidism -Checked TSH and was 1.635 -Continue Levothyroxine 125 mcg   HLD -Lipid Panel was checked and showed a total cholesterol/HDL  ratio 3.6, cholesterol of 197, HDL 55, LDL 122, triglycerides of 161, VLDL 20 -Unable to start a statin given that she has had significant issues in the past when she started take statins. -Neurology has discussed the possibility of starting Leqvio and paperwork will be completed in outpatient setting  Normocytic Anemia -Hgb/Hct Trend: Recent Labs  Lab 12/16/22 1100 12/16/22 1107 12/17/22 1030  HGB 12.3 11.9* 11.5*  HCT 38.1 35.0* 34.1*  MCV 93.4  --  91.7  -Check Anemia Panel in the a.m. -Continue to monitor for signs and symptoms of bleeding; no overt bleeding noted -Repeat CBC in a.m.  CKD Stage 3a Metabolic Acidosis -BUN/Cr Trend: Recent Labs  Lab 12/16/22 1100 12/16/22 1107 12/17/22 1158  BUN 16 18 15   CREATININE 1.15* 1.10* 1.14*  No results for input(s): "GFR" in the last 720 hours. -Metabolic acidosis has improved and she had now has a CO2 of 25, anion gap of 8, chloride of 101 -Avoid Nephrotoxic Medications, Contrast Dyes, Hypotension and Dehydration to Ensure Adequate Renal Perfusion and will need to Renally Adjust Meds -Continue to Monitor and Trend Renal Function carefully and repeat CMP in the AM   Thrombocytopenia -Patient's Platelet Count Trend: Recent Labs  Lab 12/16/22 1100 12/17/22 1030  PLT 132* 147*  -Continue to Monitor for S/Sx of Bleeding; no overt bleeding noted -Repeat CBC in the AM  Hypoalbuminemia -Patient's Albumin Trend: Recent Labs  Lab 12/16/22 1100 12/17/22 1158  ALBUMIN 3.2* 3.2*  -Continue to Monitor and Trend and repeat CMP in the AM  DVT prophylaxis: enoxaparin (LOVENOX) injection 40 mg Start: 12/16/22 1330    Code Status: Full Code Family Communication: Discussed with the son at bedside  Disposition Plan:  Level of care: Telemetry Medical Status is: Observation The patient will require care spanning > 2 midnights and should be moved to inpatient because: Undergoing stroke workup   Consultants:   Neurology  Procedures:  As delineated as above  Antimicrobials:  Anti-infectives (From admission, onward)    None       Subjective: Seen and examined at bedside and was still having some aphasia.  Denies any nausea or vomiting.  No any complaints at this time.  Feels okay.  No lightheadedness or dizziness.  No other concerns or complaints at this time.  Objective: Vitals:   12/17/22 0340 12/17/22 0832 12/17/22 1135 12/17/22 1537  BP: 133/67 121/67 132/73 (!) 120/91  Pulse: 71 70 70 70  Resp: 18 18 17 16   Temp: 98 F (36.7 C) 98.7 F (37.1 C) 98.4 F (36.9 C) 97.7 F (36.5 C)  TempSrc: Oral Oral Oral Oral  SpO2: 98% 97% 98% 97%  Weight:      Height:        Intake/Output Summary (Last 24 hours) at 12/17/2022 1740 Last data filed at 12/17/2022 1400 Gross per 24 hour  Intake 840 ml  Output --  Net 840 ml   Filed Weights   12/16/22 1100 12/16/22 1438  Weight: 57.4 kg 57.4 kg   Examination: Physical Exam:  Constitutional: Thin elderly Caucasian female currently no acute distress Respiratory: Diminished to auscultation bilaterally, no wheezing, rales, rhonchi or crackles. Normal respiratory effort and patient is not tachypenic. No accessory muscle use.  Unlabored breathing  Cardiovascular: RRR, no murmurs / rubs / gallops. S1 and S2 auscultated. No extremity edema.  Abdomen: Soft, non-tender, non-distended. Bowel sounds positive.  GU: Deferred. Musculoskeletal: No clubbing / cyanosis of digits/nails. No joint deformity upper and lower extremities.  Skin: No rashes, lesions, ulcers on limited skin evaluation. No induration; Warm and dry.  Neurologic: Continues to have some intermittent aphasia Psychiatric: Normal judgment and insight. Alert and oriented x 3.  Pleasant Mood and affect.  Data Reviewed: I have personally reviewed following labs and imaging studies  CBC: Recent Labs  Lab 12/16/22 1100 12/16/22 1107 12/17/22 1030  WBC 5.2  --  4.6  NEUTROABS 3.1  --   3.1  HGB 12.3 11.9* 11.5*  HCT 38.1 35.0* 34.1*  MCV 93.4  --  91.7  PLT 132*  --  147*   Basic Metabolic Panel: Recent Labs  Lab 12/16/22 1100 12/16/22 1107 12/17/22 1158  NA 138 138 134*  K 4.0 3.8 4.1  CL 106 103 101  CO2 21*  --  25  GLUCOSE 112* 110* 112*  BUN 16 18 15   CREATININE 1.15* 1.10* 1.14*  CALCIUM 8.5*  --  8.7*  MG  --   --  1.9  PHOS  --   --  4.1   GFR: Estimated Creatinine Clearance: 28.9 mL/min (A) (by C-G formula based on SCr of 1.14 mg/dL (H)). Liver Function Tests: Recent Labs  Lab 12/16/22 1100 12/17/22 1158  AST 27 26  ALT 12 14  ALKPHOS 74 76  BILITOT 0.5 1.1  PROT 6.1* 6.2*  ALBUMIN 3.2* 3.2*   No results for input(s): "LIPASE", "AMYLASE" in the last 168 hours. No results for input(s): "AMMONIA" in the last 168 hours. Coagulation Profile: Recent Labs  Lab 12/16/22 1100  INR 1.0   Cardiac Enzymes: No results for input(s): "CKTOTAL", "CKMB", "CKMBINDEX", "TROPONINI" in the last 168 hours. BNP (last 3 results) No results for input(s): "PROBNP" in the last 8760 hours. HbA1C: Recent Labs    12/17/22 0625  HGBA1C 5.5   CBG: Recent Labs  Lab 12/16/22 1103  GLUCAP 117*   Lipid Profile: Recent Labs    12/17/22 0625  CHOL 197  HDL 55  LDLCALC 122*  TRIG 102  CHOLHDL 3.6   Thyroid Function Tests: Recent Labs    12/16/22 1100  TSH 1.635   Anemia Panel: No results for input(s): "VITAMINB12", "FOLATE", "FERRITIN", "TIBC", "IRON", "RETICCTPCT" in the last 72 hours. Sepsis Labs: No results for input(s): "PROCALCITON", "LATICACIDVEN" in the last 168 hours.  No results found for this or any previous visit (from the past 240 hour(s)).   Radiology Studies: MR BRAIN WO CONTRAST  Result Date: 12/17/2022 CLINICAL DATA:  Stroke, follow up EXAM: MRI HEAD WITHOUT CONTRAST TECHNIQUE: Multiplanar, multiecho pulse sequences of the brain and surrounding structures were obtained without intravenous contrast. COMPARISON:  CT head May  20, 24. FINDINGS: Brain: Acute left frontal infarct. Mild edema without mass effect. No evidence of acute hemorrhage, mass lesion, midline shift or hydrocephalus. Scattered T2/FLAIR hyperintensities in the white matter, nonspecific but compatible with chronic microvascular ischemic disease. Vascular: Major arterial flow voids are maintained skull base. Skull and upper cervical spine: Normal marrow signal. Sinuses/Orbits: Clear sinuses.  No acute orbital findings Other: No mastoid effusions. IMPRESSION: Acute left frontal infarct. Electronically Signed   By: Feliberto Harts M.D.   On: 12/17/2022 13:45   EEG adult  Result Date: 12/16/2022 Charlsie Quest, MD     12/16/2022  5:08 PM  Patient Name: Michelle Winters MRN: [id number] Epilepsy Attending: Charlsie Quest Referring Physician/Provider: Marjorie Smolder, NP Date: 12/16/2022 Duration: 22.40 mins Patient history: 89 year old patient with aphasia which started this morning. EEG to evaluate for seizure. Level of alertness: Awake, drowsy AEDs during EEG study: None Technical aspects: This EEG study was done with scalp electrodes positioned according to the 10-20 International system of electrode placement. Electrical activity was reviewed with band pass filter of 1-70Hz , sensitivity of 7 uV/mm, display speed of 100mm/sec with a 60Hz  notched filter applied as appropriate. EEG data were recorded continuously and digitally stored.  Video monitoring was available and reviewed as appropriate. Description: The posterior dominant rhythm consists of 9 Hz activity of moderate voltage (25-35 uV) seen predominantly in posterior head regions, symmetric and reactive to eye opening and eye closing. Drowsiness was characterized by attenuation of the posterior background rhythm. EEG showed continuous 3 to 5 Hz theta-delta slowing in left frontotemporal region. Hyperventilation and photic stimulation were not performed.   ABNORMALITY - Continuous slow, left frontotemporal  region IMPRESSION: This study is suggestive of cortical dysfunction arising from left frontotemporal region, likely secondary to underlying structural abnormality, post-ictal state. No seizures or epileptiform discharges were seen throughout the recording. Priyanka Annabelle Harman   CT ANGIO HEAD NECK W WO CM W PERF (CODE STROKE)  Result Date: 12/16/2022 CLINICAL DATA:  Provided history: Neuro deficit, acute, stroke suspected. Difficulty speaking. EXAM: CT ANGIOGRAPHY HEAD AND NECK CT PERFUSION BRAIN TECHNIQUE: Multidetector CT imaging of the head and neck was performed using the standard protocol during bolus administration of intravenous contrast. Multiplanar CT image reconstructions and MIPs were obtained to evaluate the vascular anatomy. Carotid stenosis measurements (when applicable) are obtained utilizing NASCET criteria, using the distal internal carotid diameter as the denominator. Multiphase CT imaging of the brain was performed following IV bolus contrast injection. Subsequent parametric perfusion maps were calculated using RAPID software. RADIATION DOSE REDUCTION: This exam was performed according to the departmental dose-optimization program which includes automated exposure control, adjustment of the mA and/or kV according to patient size and/or use of iterative reconstruction technique. CONTRAST:  OMNIPAQUE IOHEXOL 350 MG/ML SOLN COMPARISON:  Noncontrast head CT performed earlier today 12/15/2021. FINDINGS: CTA NECK FINDINGS Aortic arch: Atherosclerotic plaque within the visualized thoracic aorta and proximal major branch vessels of the neck. No hemodynamically significant innominate or proximal subclavian artery stenosis. Right carotid system: CCA and ICA patent within the neck. Atherosclerotic plaque within the CCA and about the carotid bifurcation without measurable stenosis. Left carotid system: CCA and ICA patent within the neck without stenosis. Mild atherosclerotic plaque within the distal CCA  and about the carotid bifurcation. Vertebral arteries: Vertebral arteries patent within the neck. The left vertebral artery is strongly dominant. As sclerotic plaque scattered within the cervical left vertebral artery with no more than mild stenosis. Skeleton: Cervical spondylosis.  Poor dentition. Other neck: Prior thyroidectomy. No neck mass or cervical lymphadenopathy. Upper chest: No consolidation within the imaged lung apices. Review of the MIP images confirms the above findings CTA HEAD FINDINGS Anterior circulation: The intracranial internal carotid arteries are patent. Nonstenotic atherosclerotic plaque within both vessels. The M1 middle cerebral arteries are patent. No M2 proximal branch occlusion or high-grade proximal stenosis. The anterior cerebral arteries are patent. 2 mm medially projecting vascular protrusion arising from the paraclinoid right internal carotid artery compatible with a small aneurysm (series 5, image 103). 2 mm posteroinferiorly projecting vascular protrusion arising from the paraclinoid left internal carotid  artery, may reflect an infundibulum or aneurysm. Posterior circulation: The right vertebral artery is developmentally diminutive beyond the PICA origin, but patent. The dominant left vertebral artery is patent intracranially without stenosis. The basilar artery is patent. The posterior cerebral arteries are patent. The right PCA is fetal in origin. The left posterior communicating artery is diminutive or absent. Venous sinuses: Within the limitations of contrast timing, no convincing thrombus. Anatomic variants: As described. Review of the MIP images confirms the above findings CT Brain Perfusion Findings: CBF (<30%) Volume: 0mL Perfusion (Tmax>6.0s) volume: 8mL Mismatch Volume: 8mL Infarction Location:None identified No emergent large vessel occlusion identified. This result, and the CT perfusion head results, were called to Dr. Selina Cooley at 12 p.m. on 12/16/2022. IMPRESSION: CTA  neck: 1. The common carotid and internal carotid arteries are patent within the neck without stenosis. Mild atherosclerotic plaque bilaterally, as described. 2. The vertebral arteries are patent within the neck. Atherosclerotic plaque within the dominant left vertebral artery with no more than mild stenosis. CTA head: 1. No intracranial large vessel occlusion or proximal high-grade arterial stenosis identified. 2. Non-stenotic atherosclerotic plaque within the intracranial ICAs bilaterally. 3. 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 4. 2 mm posteroinferiorly projecting vascular protrusion arising from the paraclinoid left ICA, which may reflect an infundibulum or aneurysm. CT perfusion head: The perfusion software identifies an 8 mL region of hypoperfused parenchyma within the left frontal lobe (MCA vascular territory). The perfusion software identifies no core infarct. Reported mismatch volume: 8 mL. Electronically Signed   By: Jackey Loge D.O.   On: 12/16/2022 12:07   CT HEAD CODE STROKE WO CONTRAST  Result Date: 12/16/2022 CLINICAL DATA:  Code stroke. Neuro deficit, acute, stroke suspected. EXAM: CT HEAD WITHOUT CONTRAST TECHNIQUE: Contiguous axial images were obtained from the base of the skull through the vertex without intravenous contrast. RADIATION DOSE REDUCTION: This exam was performed according to the departmental dose-optimization program which includes automated exposure control, adjustment of the mA and/or kV according to patient size and/or use of iterative reconstruction technique. COMPARISON:  Head CT 04/22/2022. FINDINGS: Brain: Mild generalized parenchymal atrophy. Patchy and ill-defined hypoattenuation within the cerebral white matter, nonspecific but compatible with mild chronic small vessel ischemic disease. There is no acute intracranial hemorrhage. No demarcated cortical infarct. No extra-axial fluid collection. No evidence of an intracranial mass. No  midline shift. Vascular: No hyperdense vessel.  Atherosclerotic calcifications. Skull: No fracture or aggressive osseous lesion. Sinuses/Orbits: No mass or acute finding within the imaged orbits. No significant paranasal sinus disease. ASPECTS (Alberta Stroke Program Early CT Score) - Ganglionic level infarction (caudate, lentiform nuclei, internal capsule, insula, M1-M3 cortex): 7 - Supraganglionic infarction (M4-M6 cortex): 3 Total score (0-10 with 10 being normal): 10 These results were communicated to Dr. Selina Cooley at 11:25 amon 5/20/2024by text page via the [facility] messaging system. IMPRESSION: 1. No evidence of an acute intracranial abnormality. 2. Mild chronic small vessel ischemic changes within the cerebral white matter. 3. Mild generalized parenchymal atrophy. Electronically Signed   By: Jackey Loge D.O.   On: 12/16/2022 11:25    Scheduled Meds:  aspirin  81 mg Oral Daily   clopidogrel  75 mg Oral Daily   enoxaparin (LOVENOX) injection  40 mg Subcutaneous Q24H   levothyroxine  125 mcg Oral Daily   metoprolol tartrate  25 mg Oral BID   Continuous Infusions:   LOS: 0 days   Marguerita Merles, DO [facility] Available via Epic secure chat 7am-7pm After  these hours, please refer to coverage provider listed on [email or web address] 12/17/2022, 5:40 PM

## 2022-12-17 NOTE — Progress Notes (Signed)
Occupational Therapy Treatment Patient Details Name: Michelle Winters MRN: [id number] DOB: 1932-04-09 Today's Date: 12/17/2022   History of present illness 89 y.o. female presents to [facility] on 12/16/2022 with sudden onset aphasia. CT perfusion head shows small penumbra in L MCA territory. PMH includes DVT, CAD, PPM, CKD, HTN, hypothyroidism.   OT comments  Patient with good progress toward patient focused goals.  Continues with side steps to the left with in room mobility/toileting with quad cane, but not needing any physical assist for balance, or ADL completion from a sit to stand level.  Patient was having increased difficulty with expressive aphasia/word finding today as compared to prior days eval, but no receptive difficulties noted.  OT to continue efforts in the acute setting with no post acute OT anticipated.      Recommendations for follow up therapy are one component of a multi-disciplinary discharge planning process, led by the attending physician.  Recommendations may be updated based on patient status, additional functional criteria and insurance authorization.    Assistance Recommended at Discharge PRN  Patient can return home with the following  Assist for transportation   Equipment Recommendations  None recommended by OT    Recommendations for Other Services      Precautions / Restrictions Precautions Precautions: Fall Restrictions Weight Bearing Restrictions: No       Mobility Bed Mobility Overal bed mobility: Modified Independent                  Transfers Overall transfer level: Needs assistance Equipment used: Quad cane Transfers: Sit to/from Stand Sit to Stand: Modified independent (Device/Increase time)     Step pivot transfers: Supervision           Balance   Sitting-balance support: No upper extremity supported, Feet supported Sitting balance-Leahy Scale: Normal     Standing balance support: Single extremity supported Standing  balance-Leahy Scale: Fair                             ADL either performed or assessed with clinical judgement   ADL       Grooming: Wash/dry hands;Oral care;Modified independent;Standing               Lower Body Dressing: Modified independent;Sit to/from stand   Toilet Transfer: Retail banker;Ambulation Toilet Transfer Details (indicate cue type and reason): min side steps to the L with no overt LOB.                Extremity/Trunk Assessment Upper Extremity Assessment Upper Extremity Assessment: Overall WFL for tasks assessed   Lower Extremity Assessment Lower Extremity Assessment: Overall WFL for tasks assessed   Cervical / Trunk Assessment Cervical / Trunk Assessment: Normal    Vision Patient Visual Report: No change from baseline     Perception Perception Perception: Within Functional Limits   Praxis Praxis Praxis: Intact    Cognition Arousal/Alertness: Awake/alert Behavior During Therapy: WFL for tasks assessed/performed Overall Cognitive Status: Within Functional Limits for tasks assessed                                                             Pertinent Vitals/ Pain       Pain Assessment Pain Assessment: No/denies pain  Frequency  Min 2X/week        Progress Toward Goals  OT Goals(current goals can now be found in the care plan section)  Progress towards OT goals: Progressing toward goals  Acute Rehab OT Goals OT Goal Formulation: With patient Time For Goal Achievement: 12/30/22 Potential to Achieve Goals: Good ADL Goals Pt Will Perform Grooming: with modified independence;standing Pt Will Perform Lower Body Dressing: with modified independence;sit to/from stand Pt Will Transfer to Toilet: with modified independence;ambulating;regular height toilet  Plan Discharge plan remains appropriate     Co-evaluation                 AM-PAC OT "6 Clicks" Daily Activity     Outcome Measure   Help from another person eating meals?: None Help from another person taking care of personal grooming?: None Help from another person toileting, which includes using toliet, bedpan, or urinal?: A Little Help from another person bathing (including washing, rinsing, drying)?: None Help from another person to put on and taking off regular upper body clothing?: None Help from another person to put on and taking off regular lower body clothing?: None 6 Click Score: 23    End of Session Equipment Utilized During Treatment: Gait belt;Other (comment) (Quad cane)  OT Visit Diagnosis: Cognitive communication deficit (R41.841)   Activity Tolerance Patient tolerated treatment well   Patient Left in bed;with call bell/phone within reach;with bed alarm set   Nurse Communication Mobility status        Time: 5409-8119 OT Time Calculation (min): 20 min  Charges: OT General Charges $OT Visit: 1 Visit OT Treatments $Self Care/Home Management : 8-22 mins  12/17/2022  RP, OTR/L  Acute Rehabilitation Services  Office:  [phone]   Suzanna Obey 12/17/2022, 5:48 PM

## 2022-12-18 ENCOUNTER — Observation Stay (HOSPITAL_BASED_OUTPATIENT_CLINIC_OR_DEPARTMENT_OTHER): Payer: Medicare Other

## 2022-12-18 DIAGNOSIS — I639 Cerebral infarction, unspecified: Secondary | ICD-10-CM | POA: Diagnosis not present

## 2022-12-18 DIAGNOSIS — E039 Hypothyroidism, unspecified: Secondary | ICD-10-CM | POA: Diagnosis not present

## 2022-12-18 DIAGNOSIS — I6300 Cerebral infarction due to thrombosis of unspecified precerebral artery: Secondary | ICD-10-CM | POA: Diagnosis not present

## 2022-12-18 DIAGNOSIS — I4729 Other ventricular tachycardia: Secondary | ICD-10-CM

## 2022-12-18 DIAGNOSIS — Z950 Presence of cardiac pacemaker: Secondary | ICD-10-CM | POA: Diagnosis not present

## 2022-12-18 DIAGNOSIS — I6389 Other cerebral infarction: Secondary | ICD-10-CM

## 2022-12-18 LAB — COMPREHENSIVE METABOLIC PANEL
ALT: 12 U/L (ref 0–44)
AST: 20 U/L (ref 15–41)
Albumin: 2.9 g/dL — ABNORMAL LOW (ref 3.5–5.0)
Alkaline Phosphatase: 66 U/L (ref 38–126)
Anion gap: 9 (ref 5–15)
BUN: 25 mg/dL — ABNORMAL HIGH (ref 8–23)
CO2: 24 mmol/L (ref 22–32)
Calcium: 8.4 mg/dL — ABNORMAL LOW (ref 8.9–10.3)
Chloride: 99 mmol/L (ref 98–111)
Creatinine, Ser: 1.24 mg/dL — ABNORMAL HIGH (ref 0.44–1.00)
GFR, Estimated: 42 mL/min — ABNORMAL LOW (ref >60)
Glucose, Bld: 113 mg/dL — ABNORMAL HIGH (ref 70–99)
Potassium: 3.9 mmol/L (ref 3.5–5.1)
Sodium: 132 mmol/L — ABNORMAL LOW (ref 135–145)
Total Bilirubin: 1 mg/dL (ref 0.3–1.2)
Total Protein: 5.6 g/dL — ABNORMAL LOW (ref 6.5–8.1)

## 2022-12-18 LAB — RETICULOCYTES
Immature Retic Fract: 14.9 % (ref 2.3–15.9)
RBC.: 3.73 MIL/uL — ABNORMAL LOW (ref 3.87–5.11)
Retic Count, Absolute: 65.3 10*3/uL (ref 19.0–186.0)
Retic Ct Pct: 1.8 % (ref 0.4–3.1)

## 2022-12-18 LAB — IRON AND TIBC
Iron: 88 ug/dL (ref 28–170)
Saturation Ratios: 27 % (ref 10.4–31.8)
TIBC: 322 ug/dL (ref 250–450)
UIBC: 234 ug/dL

## 2022-12-18 LAB — ECHOCARDIOGRAM COMPLETE
AR max vel: 1.98 cm2
AV Peak grad: 8.1 mmHg
Ao pk vel: 1.42 m/s
Area-P 1/2: 4.61 cm2
Height: 64 in
S' Lateral: 4.7 cm
Weight: 2024.7 oz

## 2022-12-18 LAB — CBC WITH DIFFERENTIAL/PLATELET
Abs Immature Granulocytes: 0.04 10*3/uL (ref 0.00–0.07)
Basophils Absolute: 0 10*3/uL (ref 0.0–0.1)
Basophils Relative: 1 %
Eosinophils Absolute: 0.2 10*3/uL (ref 0.0–0.5)
Eosinophils Relative: 4 %
HCT: 33.5 % — ABNORMAL LOW (ref 36.0–46.0)
Hemoglobin: 11.5 g/dL — ABNORMAL LOW (ref 12.0–15.0)
Immature Granulocytes: 1 %
Lymphocytes Relative: 21 %
Lymphs Abs: 1 10*3/uL (ref 0.7–4.0)
MCH: 30.6 pg (ref 26.0–34.0)
MCHC: 34.3 g/dL (ref 30.0–36.0)
MCV: 89.1 fL (ref 80.0–100.0)
Monocytes Absolute: 0.5 10*3/uL (ref 0.1–1.0)
Monocytes Relative: 11 %
Neutro Abs: 3 10*3/uL (ref 1.7–7.7)
Neutrophils Relative %: 62 %
Platelets: 142 10*3/uL — ABNORMAL LOW (ref 150–400)
RBC: 3.76 MIL/uL — ABNORMAL LOW (ref 3.87–5.11)
RDW: 12.7 % (ref 11.5–15.5)
WBC: 4.8 10*3/uL (ref 4.0–10.5)
nRBC: 0 % (ref 0.0–0.2)

## 2022-12-18 LAB — FOLATE: Folate: 16.1 ng/mL (ref >5.9)

## 2022-12-18 LAB — FERRITIN: Ferritin: 51 ng/mL (ref 11–307)

## 2022-12-18 LAB — MAGNESIUM: Magnesium: 1.8 mg/dL (ref 1.7–2.4)

## 2022-12-18 LAB — VITAMIN B12: Vitamin B-12: 225 pg/mL (ref 180–914)

## 2022-12-18 LAB — PHOSPHORUS: Phosphorus: 5.1 mg/dL — ABNORMAL HIGH (ref 2.5–4.6)

## 2022-12-18 MED ORDER — PERFLUTREN LIPID MICROSPHERE
1.0000 mL | INTRAVENOUS | Status: AC | PRN
  Administered 2022-12-18: 2 mL via INTRAVENOUS

## 2022-12-18 MED ORDER — ACETAMINOPHEN 325 MG PO TABS: 650.0000 mg | ORAL_TABLET | Freq: Four times a day (QID) | ORAL | Status: DC | PRN

## 2022-12-18 MED ORDER — ENOXAPARIN SODIUM 30 MG/0.3ML IJ SOSY: 30.0000 mg | PREFILLED_SYRINGE | INTRAMUSCULAR | Status: DC

## 2022-12-18 MED ORDER — CLOPIDOGREL BISULFATE 75 MG PO TABS: 75.0000 mg | ORAL_TABLET | Freq: Every day | ORAL | 0 refills | Status: AC

## 2022-12-18 MED ORDER — ASPIRIN 81 MG PO CHEW: 81.0000 mg | CHEWABLE_TABLET | Freq: Every day | ORAL | Status: DC

## 2022-12-18 NOTE — Progress Notes (Signed)
STROKE TEAM PROGRESS NOTE   INTERVAL HISTORY No family at the bedside .  She has mild nonfluent speech and has good comprehension and follows commands well.  Vital signs stable. MRI confirms small left frontal MCA branch infarct.  Vitals:   12/17/22 2347 12/18/22 0337 12/18/22 0832 12/18/22 1126  BP: 131/70 123/60 133/68 124/76  Pulse: 72 71 70 70  Resp: 16 16 15 18   Temp: 98.9 F (37.2 C) 98.3 F (36.8 C) 97.8 F (36.6 C) 98.2 F (36.8 C)  TempSrc: Oral Oral Oral Oral  SpO2: 96% 97% 98% 98%  Weight:      Height:       CBC:  Recent Labs  Lab 12/17/22 1030 12/18/22 0535  WBC 4.6 4.8  NEUTROABS 3.1 3.0  HGB 11.5* 11.5*  HCT 34.1* 33.5*  MCV 91.7 89.1  PLT 147* 142*   Basic Metabolic Panel:  Recent Labs  Lab 12/17/22 1158 12/18/22 0535  NA 134* 132*  K 4.1 3.9  CL 101 99  CO2 25 24  GLUCOSE 112* 113*  BUN 15 25*  CREATININE 1.14* 1.24*  CALCIUM 8.7* 8.4*  MG 1.9 1.8  PHOS 4.1 5.1*   Lipid Panel:  Recent Labs  Lab 12/17/22 0625  CHOL 197  TRIG 102  HDL 55  CHOLHDL 3.6  VLDL 20  LDLCALC 161*   HgbA1c:  Recent Labs  Lab 12/17/22 0625  HGBA1C 5.5   Urine Drug Screen: No results for input(s): "LABOPIA", "COCAINSCRNUR", "LABBENZ", "AMPHETMU", "THCU", "LABBARB" in the last 168 hours.  Alcohol Level  Recent Labs  Lab 12/16/22 1058  ETH <10    IMAGING past 24 hours No results found.  PHYSICAL EXAM GENERAL: Awake, alert, in no acute distress Psych: Affect appropriate for situation, patient is calm and cooperative with examination Head: Normocephalic and atraumatic, without obvious abnormality EENT: Normal conjunctivae, moist mucous membranes, no OP obstruction LUNGS: Normal respiratory effort. Non-labored breathing on room air CV: Regular rate and rhythm on telemetry Extremities: warm, well perfused, without obvious deformity   NEURO:  Mental Status: Awake, alert, oriented to self, place, time, situation.  Follows commands, able to give recent  and remote history, with occasional repetition.  Speech/Language: speech is clear. No aphasia.  No neglect is noted Cranial Nerves:  II: PERRL blinks to threat bilaterally III, IV, VI: EOMI. Lid elevation symmetric and full.  VII: Face is symmetric resting and smiling.  VIII: Hearing intact to voice IX, X: Phonation normal.  XII: Tongue protrudes midline without fasciculations.   Motor: Good antigravity strength in bilateral upper and lower extremities Tone is normal. Bulk is normal.  Sensation: Intact to light touch bilaterally in all four extremities.  Coordination: FTN intact bilaterally.  No pronator drift.  Gait: Deferred   NIHSS: 1a Level of Conscious.: 0 1b LOC Questions: 1 1c LOC Commands: 0 2 Best Gaze: 0 3 Visual: 0 4 Facial Palsy: 0 5a Motor Arm - left: 0 5b Motor Arm - Right: 0 6a Motor Leg - Left: 0 6b Motor Leg - Right: 0 7 Limb Ataxia: 0 8 Sensory: 0 9 Best Language: 0 10 Dysarthria: 0 11 Extinct. and Inatten.: 0 TOTAL: 1  ASSESSMENT/PLAN Ms. JORDIS EZZO is a 89 y.o. female with history of CAD, cardiomyopathy status post pacemaker, sick sinus syndrome, CKD stage III, hypertension, hypothyroidism, left IJ DVT 2023 presenting with aphasia and word finding difficulties.  Patient was out of the TNK window.  CTA showed no LVO, CTP revealed small penumbra in  left MCA territory.  MRI revealed a left frontal infarct.  Acute left frontal infarct Etiology: Embolic from cryptogenic source code Stroke CT head  No acute abnormality.  Mild chronic Small vessel disease.  ASPECTS 10.    CTA head & neck  No intracranial LVO 2 mm aneurysm arising from the cavernous right ICA 2 mm projecting vascular protrusion arising from left ICA, possible aneurysm. CT perfusion  8 mm region of hyperperfused parenchyma within the left frontal lobe (MCA vascular territory)  MRI   Acute left frontal infarct  2D Echo: pending  LDL 122 HgbA1c 5.5 VTE prophylaxis - lovenox     Diet   Diet Heart Room service appropriate? Yes; Fluid consistency: Thin   No antithrombotic prior to admission, now on aspirin 81 mg daily and clopidogrel 75 mg daily.  Recommend aspirin and Plavix for 3 weeks followed by aspirin for monotherapy. Therapy recommendations: No follow-up needed Disposition: Pending  Hypertension CAD Sick Sinus Syndrome s/p pacemaker Home meds: Losartan 50 mg, metoprolol 50 mg, Hygroton 25mg  Permissive hypertension (OK if < 220/120) but gradually normalize in 2-3 days Long-term BP goal normotensive No AF seen on pacemaker interrogation  Hx of DVT L IJ DVT 2023 Was on Eliquis x 3months Recommend aspirin and Plavix for 3 weeks followed by aspirin for monotherapy/secondary stroke prevention.   Hyperlipidemia Home meds:  none LDL 122, goal < 70 Patient states she has had issues when previously tried to take statins.  We discussed possibility of starting Leqvio. Paperwork will be completed.   Diabetes type II, no history Home meds:  none HgbA1c 5.5, goal < 7.0 CBGs Recent Labs    12/16/22 1103  GLUCAP 117*    SSI  Other Stroke Risk Factors Advanced Age >/= 58  Hypothyroidism  Other Active Problems 3b CKD Cr 1.14  Hospital day # 0     Patient presented with sudden onset of aphasia due to embolic left MCA branch infarct of cryptogenic etiology.  She has [employer] pacemaker which needs to be interrogated to look for any paroxysmal A-fib.  Recommend aspirin and Plavix for 3 weeks followed by aspirin alone and aggressive risk factor modification.  Patient has history of statin intolerance and will need to consider starting Leqvio and will begin paperwork.  Check echocardiogram results.  Recommend 30-day heart monitor at discharge for paroxysmal A-fib.  Follow-up as an outpatient stroke clinic in 2 months.  Stroke team will sign off.  Kindly call for questions  Delia Heady, MD Medical Director [facility] Pager:  [phone] 12/18/2022 2:07 PM   To contact Stroke Continuity provider, please refer to [email or web address]. After hours, contact General Neurology

## 2022-12-18 NOTE — Progress Notes (Signed)
Echocardiogram 2D Echocardiogram has been performed.  Michelle Winters 12/18/2022, 2:34 PM

## 2022-12-18 NOTE — Progress Notes (Signed)
Attempted Echocardigram, patient wants to eat. Will come back.

## 2022-12-18 NOTE — Progress Notes (Signed)
Discharge education/instructions provided to patient and her son at the bedside. Writer answered all questions to patient's satisfaction. VSS. Respirations are even and unlabored. RA. Patient denies CP or SOB. NAD.    Patient's personal belongings and AVS sent with patient. PIVx2 removed, tip intact.

## 2022-12-18 NOTE — Discharge Summary (Signed)
Physician Discharge Summary  Michelle Winters UEA:[id number] DOB: 1932-02-05 DOA: 12/16/2022  PCP: [facility]  Admit date: 12/16/2022 Discharge date: 12/18/2022 Admitted From: Home Disposition: Home Recommendations for Outpatient Follow-up:  Follow up with PCP in 1 week Check blood pressure, CBC and CMP at follow-up Please follow up on the following pending results: None  Home Health: None Equipment/Devices: None  Discharge Condition: Stable CODE STATUS: Full code  Follow-up Information     Care, [facility] Follow up.   Specialty: Home Health Services Why: The home health agency will contact you for the first home visit. Contact information: [address] [place] [address] [phone]         [facility]. Schedule an appointment as soon as possible for a visit in 1 week(s).   Contact information: [address], [address] [place] [address] [phone]         [facility]. Schedule an appointment as soon as possible for a visit in 5 week(s).   Specialty: Neurology Contact information: [address] [place] [address] [phone]                Hospital course 89 year old F with PMH of cognitive impairment, HFmrEF, SSS/PPM, CKD-3A, HTN, CAD, cardiomyopathy and PVCs presenting with acute aphasia and found to have left frontal CVA on MRI.  Patient was outside tPA window on arrival.  Neurology consulted.  CTA angio head and neck showed decreased perfusion in left frontal areas.  EEG negative for seizure or epileptiform discharge.  TTE with LVEF of 45 to 50%.  LDL 122.  A1c 5.5%.  On the day of discharge, patient's aphasia improved.  Device interrogation did not show A-fib.  Evaluated by neurology and cleared for discharge on aspirin and Plavix for 3 weeks followed by aspirin alone.  Patient has history of statin  intolerance and neurology begin paperwork for Leqvio.  Patient was evaluated by therapy and cleared for discharge.  Finding discussed with patient's son, [name] over the phone.   See individual problem list below for more.   Problems addressed during this hospitalization Principal Problem:   Stroke (cerebrum) [facility]) Active Problems:   Hypothyroidism   NSVT (nonsustained ventricular tachycardia) (HCC)   S/P cardiac pacemaker procedure 05/11/14 [employer]   Sick sinus syndrome (HCC)              Time spent 35 minutes  Vital signs Vitals:   12/18/22 0337 12/18/22 0832 12/18/22 1126 12/18/22 1507  BP: 123/60 133/68 124/76 109/64  Pulse: 71 70 70 71  Temp: 98.3 F (36.8 C) 97.8 F (36.6 C) 98.2 F (36.8 C) 98 F (36.7 C)  Resp: 16 15 18    Height:      Weight:      SpO2: 97% 98% 98% 99%  TempSrc: Oral Oral Oral Oral  BMI (Calculated):         Discharge exam  GENERAL: No apparent distress.  Nontoxic. HEENT: MMM.  Vision and hearing grossly intact.  NECK: Supple.  No apparent JVD.  RESP:  No IWOB.  Fair aeration bilaterally. CVS:  RRR. Heart sounds normal.  ABD/GI/GU: BS+. Abd soft, NTND.  MSK/EXT:  Moves extremities. No apparent deformity. No edema.  SKIN: no apparent skin lesion or wound NEURO: Awake and alert. Oriented appropriately.  No apparent focal neuro deficit. PSYCH: Calm. Normal affect.   Discharge Instructions Discharge Instructions  Diet - low sodium heart healthy   Complete by: As directed    Discharge instructions   Complete by: As directed    It has been a pleasure taking care of you!  You were hospitalized due to difficulty speaking likely from stroke.  Your symptoms improved.  We are discharging you on aspirin and Plavix to reduce your risk of stroke.  Please take your medications as prescribed.  Follow-up with your primary care doctor in 1 to 2 weeks or sooner if needed.  Follow-up with neurology in 4 to 6 weeks.   Take care,   Increase  activity slowly   Complete by: As directed       Allergies as of 12/18/2022       Reactions   Lisinopril Other (See Comments)   Chest tightness   Simvastatin Other (See Comments)   Muscle pain   Welchol [colesevelam Hcl] Other (See Comments)   Muscle pain   Amiodarone Other (See Comments)   Lowered heart rate   Ciprofloxacin Nausea Only   Zetia [ezetimibe] Nausea Only        Medication List     TAKE these medications    acetaminophen 325 MG tablet Commonly known as: TYLENOL Take 2 tablets (650 mg total) by mouth every 6 (six) hours as needed for mild pain, fever or headache (or temp > 37.5 C (99.5 F)). What changed:  medication strength how much to take when to take this reasons to take this   aspirin 81 MG chewable tablet Chew 1 tablet (81 mg total) by mouth daily. Start taking on: Dec 19, 2022   chlorthalidone 25 MG tablet Commonly known as: HYGROTON Take 12.5 mg by mouth every morning.   clopidogrel 75 MG tablet Commonly known as: PLAVIX Take 1 tablet (75 mg total) by mouth daily for 21 days. Start taking on: Dec 19, 2022   levothyroxine 125 MCG tablet Commonly known as: SYNTHROID Take 125 mcg by mouth daily.   loratadine 10 MG tablet Commonly known as: CLARITIN Take 10 mg by mouth daily as needed for allergies.   losartan 50 MG tablet Commonly known as: COZAAR Take 1 tablet by mouth daily.   metoprolol succinate 50 MG 24 hr tablet Commonly known as: TOPROL-XL Take 50 mg by mouth daily.   sodium chloride 0.65 % Soln nasal spray Commonly known as: OCEAN Place 1 spray into both nostrils as needed for congestion.   VITAMIN B-12 IJ Inject 1 Dose as directed every 30 (thirty) days.        Consultations: Neurology  Procedures/Studies:   ECHOCARDIOGRAM COMPLETE  Result Date: 12/18/2022    ECHOCARDIOGRAM REPORT   Patient Name:   Michelle Winters Date of Exam: 12/18/2022 Medical Rec #:  [id number]        Height:       64.0 in Accession #:     [id number]       Weight:       126.5 lb Date of Birth:  [birth year]         BSA:          1.611 m Patient Age:    89 years         BP:           133/68 mmHg Patient Gender: F                HR:           70 bpm. Exam Location:  Inpatient Procedure: 2D  Echo, Cardiac Doppler, Color Doppler and Intracardiac            Opacification Agent Indications:    Stroke  History:        Patient has no prior history of Echocardiogram examinations.                 Risk Factors:Hypertension.  Sonographer:    Lucendia Herrlich Referring Phys: [id number] CORTNEY E DE LA TORRE IMPRESSIONS  1. Left ventricular ejection fraction, by estimation, is 45 to 50%. The left ventricle has mildly decreased function. The left ventricle has no regional wall motion abnormalities. Left ventricular diastolic parameters are consistent with Grade I diastolic dysfunction (impaired relaxation).  2. Right ventricular systolic function is normal. The right ventricular size is normal. There is normal pulmonary artery systolic pressure. The estimated right ventricular systolic pressure is 23.1 mmHg.  3. The mitral valve is normal in structure. Trivial mitral valve regurgitation. No evidence of mitral stenosis.  4. The aortic valve is calcified. There is mild calcification of the aortic valve. Aortic valve regurgitation is not visualized. Aortic valve sclerosis is present, with no evidence of aortic valve stenosis. Aortic valve Vmax measures 1.42 m/s.  5. The inferior vena cava is normal in size with greater than 50% respiratory variability, suggesting right atrial pressure of 3 mmHg. Conclusion(s)/Recommendation(s): No intracardiac source of embolism detected on this transthoracic study. Consider a transesophageal echocardiogram to exclude cardiac source of embolism if clinically indicated. FINDINGS  Left Ventricle: Left ventricular ejection fraction, by estimation, is 45 to 50%. The left ventricle has mildly decreased function. The left ventricle has no regional  wall motion abnormalities. Definity contrast agent was given IV to delineate the left ventricular endocardial borders. The left ventricular internal cavity size was normal in size. There is no left ventricular hypertrophy. Left ventricular diastolic parameters are consistent with Grade I diastolic dysfunction (impaired relaxation).  LV Wall Scoring: The basal anterolateral segment, apical septal segment, and apex are akinetic. Right Ventricle: The right ventricular size is normal. No increase in right ventricular wall thickness. Right ventricular systolic function is normal. There is normal pulmonary artery systolic pressure. The tricuspid regurgitant velocity is 2.24 m/s, and  with an assumed right atrial pressure of 3 mmHg, the estimated right ventricular systolic pressure is 23.1 mmHg. Left Atrium: Left atrial size was normal in size. Right Atrium: Right atrial size was normal in size. Pericardium: There is no evidence of pericardial effusion. Mitral Valve: The mitral valve is normal in structure. Trivial mitral valve regurgitation. No evidence of mitral valve stenosis. Tricuspid Valve: The tricuspid valve is normal in structure. Tricuspid valve regurgitation is trivial. No evidence of tricuspid stenosis. Aortic Valve: The aortic valve is calcified. There is mild calcification of the aortic valve. Aortic valve regurgitation is not visualized. Aortic valve sclerosis is present, with no evidence of aortic valve stenosis. Aortic valve peak gradient measures 8.1 mmHg. Pulmonic Valve: The pulmonic valve was normal in structure. Pulmonic valve regurgitation is not visualized. No evidence of pulmonic stenosis. Aorta: The aortic root is normal in size and structure. Venous: The inferior vena cava is normal in size with greater than 50% respiratory variability, suggesting right atrial pressure of 3 mmHg. IAS/Shunts: No atrial level shunt detected by color flow Doppler. Additional Comments: A is visualized in the right  ventricle and right atrium.  LEFT VENTRICLE PLAX 2D LVIDd:         4.80 cm   Diastology LVIDs:  4.70 cm   LV e' medial:   3.65 cm/s LV PW:         0.90 cm   LV E/e' medial: 10.6 LV IVS:        1.10 cm LVOT diam:     2.00 cm LV SV:         46 LV SV Index:   28 LVOT Area:     3.14 cm  RIGHT VENTRICLE            IVC RV S prime:     9.20 cm/s  IVC diam: 1.30 cm TAPSE (M-mode): 1.6 cm LEFT ATRIUM             Index        RIGHT ATRIUM           Index LA diam:        3.60 cm 2.24 cm/m   RA Area:     12.40 cm LA Vol (A2C):   42.6 ml 26.45 ml/m  RA Volume:   29.10 ml  18.07 ml/m LA Vol (A4C):   29.1 ml 18.09 ml/m LA Biplane Vol: 42.7 ml 26.51 ml/m  AORTIC VALVE                 PULMONIC VALVE AV Area (Vmax): 1.98 cm     PR End Diast Vel: 2.78 msec AV Vmax:        142.00 cm/s AV Peak Grad:   8.1 mmHg LVOT Vmax:      89.43 cm/s LVOT Vmean:     56.100 cm/s LVOT VTI:       0.145 m  AORTA Ao Root diam: 3.20 cm Ao Asc diam:  3.50 cm MITRAL VALVE               TRICUSPID VALVE MV Area (PHT): 4.61 cm    TR Peak grad:   20.1 mmHg MV Decel Time: 165 msec    TR Vmax:        224.00 cm/s MV E velocity: 38.55 cm/s MV A velocity: 82.05 cm/s  SHUNTS MV E/A ratio:  0.47        Systemic VTI:  0.14 m                            Systemic Diam: 2.00 cm Donato Schultz MD Electronically signed by Donato Schultz MD Signature Date/Time: 12/18/2022/5:34:01 PM    Final    MR BRAIN WO CONTRAST  Result Date: 12/17/2022 CLINICAL DATA:  Stroke, follow up EXAM: MRI HEAD WITHOUT CONTRAST TECHNIQUE: Multiplanar, multiecho pulse sequences of the brain and surrounding structures were obtained without intravenous contrast. COMPARISON:  CT head May 20, 24. FINDINGS: Brain: Acute left frontal infarct. Mild edema without mass effect. No evidence of acute hemorrhage, mass lesion, midline shift or hydrocephalus. Scattered T2/FLAIR hyperintensities in the white matter, nonspecific but compatible with chronic microvascular ischemic disease. Vascular: Major  arterial flow voids are maintained skull base. Skull and upper cervical spine: Normal marrow signal. Sinuses/Orbits: Clear sinuses.  No acute orbital findings Other: No mastoid effusions. IMPRESSION: Acute left frontal infarct. Electronically Signed   By: Feliberto Harts M.D.   On: 12/17/2022 13:45   EEG adult  Result Date: 12/16/2022 Charlsie Quest, MD     12/16/2022  5:08 PM Patient Name: LAMYIAH OMOTO MRN: [id number] Epilepsy Attending: Charlsie Quest Referring Physician/Provider: Marjorie Smolder, NP Date: 12/16/2022 Duration: 22.40 mins Patient history:  89 year old patient with aphasia which started this morning. EEG to evaluate for seizure. Level of alertness: Awake, drowsy AEDs during EEG study: None Technical aspects: This EEG study was done with scalp electrodes positioned according to the 10-20 International system of electrode placement. Electrical activity was reviewed with band pass filter of 1-70Hz , sensitivity of 7 uV/mm, display speed of 34mm/sec with a 60Hz  notched filter applied as appropriate. EEG data were recorded continuously and digitally stored.  Video monitoring was available and reviewed as appropriate. Description: The posterior dominant rhythm consists of 9 Hz activity of moderate voltage (25-35 uV) seen predominantly in posterior head regions, symmetric and reactive to eye opening and eye closing. Drowsiness was characterized by attenuation of the posterior background rhythm. EEG showed continuous 3 to 5 Hz theta-delta slowing in left frontotemporal region. Hyperventilation and photic stimulation were not performed.   ABNORMALITY - Continuous slow, left frontotemporal region IMPRESSION: This study is suggestive of cortical dysfunction arising from left frontotemporal region, likely secondary to underlying structural abnormality, post-ictal state. No seizures or epileptiform discharges were seen throughout the recording. Priyanka Annabelle Harman   CT ANGIO HEAD NECK W WO CM W  PERF (CODE STROKE)  Result Date: 12/16/2022 CLINICAL DATA:  Provided history: Neuro deficit, acute, stroke suspected. Difficulty speaking. EXAM: CT ANGIOGRAPHY HEAD AND NECK CT PERFUSION BRAIN TECHNIQUE: Multidetector CT imaging of the head and neck was performed using the standard protocol during bolus administration of intravenous contrast. Multiplanar CT image reconstructions and MIPs were obtained to evaluate the vascular anatomy. Carotid stenosis measurements (when applicable) are obtained utilizing NASCET criteria, using the distal internal carotid diameter as the denominator. Multiphase CT imaging of the brain was performed following IV bolus contrast injection. Subsequent parametric perfusion maps were calculated using RAPID software. RADIATION DOSE REDUCTION: This exam was performed according to the departmental dose-optimization program which includes automated exposure control, adjustment of the mA and/or kV according to patient size and/or use of iterative reconstruction technique. CONTRAST:  OMNIPAQUE IOHEXOL 350 MG/ML SOLN COMPARISON:  Noncontrast head CT performed earlier today 12/15/2021. FINDINGS: CTA NECK FINDINGS Aortic arch: Atherosclerotic plaque within the visualized thoracic aorta and proximal major branch vessels of the neck. No hemodynamically significant innominate or proximal subclavian artery stenosis. Right carotid system: CCA and ICA patent within the neck. Atherosclerotic plaque within the CCA and about the carotid bifurcation without measurable stenosis. Left carotid system: CCA and ICA patent within the neck without stenosis. Mild atherosclerotic plaque within the distal CCA and about the carotid bifurcation. Vertebral arteries: Vertebral arteries patent within the neck. The left vertebral artery is strongly dominant. As sclerotic plaque scattered within the cervical left vertebral artery with no more than mild stenosis. Skeleton: Cervical spondylosis.  Poor dentition. Other  neck: Prior thyroidectomy. No neck mass or cervical lymphadenopathy. Upper chest: No consolidation within the imaged lung apices. Review of the MIP images confirms the above findings CTA HEAD FINDINGS Anterior circulation: The intracranial internal carotid arteries are patent. Nonstenotic atherosclerotic plaque within both vessels. The M1 middle cerebral arteries are patent. No M2 proximal branch occlusion or high-grade proximal stenosis. The anterior cerebral arteries are patent. 2 mm medially projecting vascular protrusion arising from the paraclinoid right internal carotid artery compatible with a small aneurysm (series 5, image 103). 2 mm posteroinferiorly projecting vascular protrusion arising from the paraclinoid left internal carotid artery, may reflect an infundibulum or aneurysm. Posterior circulation: The right vertebral artery is developmentally diminutive beyond the PICA origin, but patent. The dominant left vertebral artery  is patent intracranially without stenosis. The basilar artery is patent. The posterior cerebral arteries are patent. The right PCA is fetal in origin. The left posterior communicating artery is diminutive or absent. Venous sinuses: Within the limitations of contrast timing, no convincing thrombus. Anatomic variants: As described. Review of the MIP images confirms the above findings CT Brain Perfusion Findings: CBF (<30%) Volume: 0mL Perfusion (Tmax>6.0s) volume: 8mL Mismatch Volume: 8mL Infarction Location:None identified No emergent large vessel occlusion identified. This result, and the CT perfusion head results, were called to Dr. Selina Cooley at 12 p.m. on 12/16/2022. IMPRESSION: CTA neck: 1. The common carotid and internal carotid arteries are patent within the neck without stenosis. Mild atherosclerotic plaque bilaterally, as described. 2. The vertebral arteries are patent within the neck. Atherosclerotic plaque within the dominant left vertebral artery with no more than mild  stenosis. CTA head: 1. No intracranial large vessel occlusion or proximal high-grade arterial stenosis identified. 2. Non-stenotic atherosclerotic plaque within the intracranial ICAs bilaterally. 3. 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 4. 2 mm posteroinferiorly projecting vascular protrusion arising from the paraclinoid left ICA, which may reflect an infundibulum or aneurysm. CT perfusion head: The perfusion software identifies an 8 mL region of hypoperfused parenchyma within the left frontal lobe (MCA vascular territory). The perfusion software identifies no core infarct. Reported mismatch volume: 8 mL. Electronically Signed   By: Jackey Loge D.O.   On: 12/16/2022 12:07   CT HEAD CODE STROKE WO CONTRAST  Result Date: 12/16/2022 CLINICAL DATA:  Code stroke. Neuro deficit, acute, stroke suspected. EXAM: CT HEAD WITHOUT CONTRAST TECHNIQUE: Contiguous axial images were obtained from the base of the skull through the vertex without intravenous contrast. RADIATION DOSE REDUCTION: This exam was performed according to the departmental dose-optimization program which includes automated exposure control, adjustment of the mA and/or kV according to patient size and/or use of iterative reconstruction technique. COMPARISON:  Head CT 04/22/2022. FINDINGS: Brain: Mild generalized parenchymal atrophy. Patchy and ill-defined hypoattenuation within the cerebral white matter, nonspecific but compatible with mild chronic small vessel ischemic disease. There is no acute intracranial hemorrhage. No demarcated cortical infarct. No extra-axial fluid collection. No evidence of an intracranial mass. No midline shift. Vascular: No hyperdense vessel.  Atherosclerotic calcifications. Skull: No fracture or aggressive osseous lesion. Sinuses/Orbits: No mass or acute finding within the imaged orbits. No significant paranasal sinus disease. ASPECTS (Alberta Stroke Program Early CT Score) - Ganglionic level  infarction (caudate, lentiform nuclei, internal capsule, insula, M1-M3 cortex): 7 - Supraganglionic infarction (M4-M6 cortex): 3 Total score (0-10 with 10 being normal): 10 These results were communicated to Dr. Selina Cooley at 11:25 amon 5/20/2024by text page via the [facility] messaging system. IMPRESSION: 1. No evidence of an acute intracranial abnormality. 2. Mild chronic small vessel ischemic changes within the cerebral white matter. 3. Mild generalized parenchymal atrophy. Electronically Signed   By: Jackey Loge D.O.   On: 12/16/2022 11:25       The results of significant diagnostics from this hospitalization (including imaging, microbiology, ancillary and laboratory) are listed below for reference.     Microbiology: No results found for this or any previous visit (from the past 240 hour(s)).   Labs:  CBC: Recent Labs  Lab 12/16/22 1100 12/16/22 1107 12/17/22 1030 12/18/22 0535  WBC 5.2  --  4.6 4.8  NEUTROABS 3.1  --  3.1 3.0  HGB 12.3 11.9* 11.5* 11.5*  HCT 38.1 35.0* 34.1* 33.5*  MCV 93.4  --  91.7 89.1  PLT 132*  --  147* 142*   BMP &GFR Recent Labs  Lab 12/16/22 1100 12/16/22 1107 12/17/22 1158 12/18/22 0535  NA 138 138 134* 132*  K 4.0 3.8 4.1 3.9  CL 106 103 101 99  CO2 21*  --  25 24  GLUCOSE 112* 110* 112* 113*  BUN 16 18 15  25*  CREATININE 1.15* 1.10* 1.14* 1.24*  CALCIUM 8.5*  --  8.7* 8.4*  MG  --   --  1.9 1.8  PHOS  --   --  4.1 5.1*   Estimated Creatinine Clearance: 26.6 mL/min (A) (by C-G formula based on SCr of 1.24 mg/dL (H)). Liver & Pancreas: Recent Labs  Lab 12/16/22 1100 12/17/22 1158 12/18/22 0535  AST 27 26 20   ALT 12 14 12   ALKPHOS 74 76 66  BILITOT 0.5 1.1 1.0  PROT 6.1* 6.2* 5.6*  ALBUMIN 3.2* 3.2* 2.9*   No results for input(s): "LIPASE", "AMYLASE" in the last 168 hours. No results for input(s): "AMMONIA" in the last 168 hours. Diabetic: Recent Labs    12/17/22 0625  HGBA1C 5.5   Recent Labs  Lab 12/16/22 1103  GLUCAP 117*    Cardiac Enzymes: No results for input(s): "CKTOTAL", "CKMB", "CKMBINDEX", "TROPONINI" in the last 168 hours. No results for input(s): "PROBNP" in the last 8760 hours. Coagulation Profile: Recent Labs  Lab 12/16/22 1100  INR 1.0   Thyroid Function Tests: Recent Labs    12/16/22 1100  TSH 1.635   Lipid Profile: Recent Labs    12/17/22 0625  CHOL 197  HDL 55  LDLCALC 122*  TRIG 102  CHOLHDL 3.6   Anemia Panel: Recent Labs    12/18/22 0535  VITAMINB12 225  FOLATE 16.1  FERRITIN 51  TIBC 322  IRON 88  RETICCTPCT 1.8   Urine analysis:    Component Value Date/Time   COLORURINE STRAW (A) 12/16/2022 1530   APPEARANCEUR CLEAR 12/16/2022 1530   LABSPEC 1.014 12/16/2022 1530   PHURINE 7.0 12/16/2022 1530   GLUCOSEU NEGATIVE 12/16/2022 1530   HGBUR MODERATE (A) 12/16/2022 1530   BILIRUBINUR NEGATIVE 12/16/2022 1530   KETONESUR NEGATIVE 12/16/2022 1530   PROTEINUR NEGATIVE 12/16/2022 1530   UROBILINOGEN 0.2 10/06/2014 1534   NITRITE NEGATIVE 12/16/2022 1530   LEUKOCYTESUR NEGATIVE 12/16/2022 1530   Sepsis Labs: Invalid input(s): "PROCALCITONIN", "LACTICIDVEN"   SIGNED:  Almon Hercules, MD  [facility] 12/18/2022, 5:44 PM

## 2022-12-25 ENCOUNTER — Ambulatory Visit (INDEPENDENT_AMBULATORY_CARE_PROVIDER_SITE_OTHER): Payer: Medicare Other

## 2022-12-25 DIAGNOSIS — I495 Sick sinus syndrome: Secondary | ICD-10-CM

## 2022-12-27 LAB — CUP PACEART REMOTE DEVICE CHECK
Battery Remaining Longevity: 11 mo
Battery Voltage: 2.88 V
Brady Statistic AP VP Percent: 0.83 %
Brady Statistic AP VS Percent: 98.8 %
Brady Statistic AS VP Percent: 0 %
Brady Statistic AS VS Percent: 0.37 %
Brady Statistic RA Percent Paced: 99.23 %
Brady Statistic RV Percent Paced: 1.03 %
Date Time Interrogation Session: 20240530155303
Implantable Lead Connection Status: 753985
Implantable Lead Connection Status: 753985
Implantable Lead Implant Date: 20151014
Implantable Lead Implant Date: 20151014
Implantable Lead Location: 753859
Implantable Lead Location: 753860
Implantable Lead Model: 5076
Implantable Lead Model: 5076
Implantable Pulse Generator Implant Date: 20151014
Lead Channel Impedance Value: 361 Ohm
Lead Channel Impedance Value: 380 Ohm
Lead Channel Impedance Value: 437 Ohm
Lead Channel Impedance Value: 494 Ohm
Lead Channel Pacing Threshold Amplitude: 0.375 V
Lead Channel Pacing Threshold Amplitude: 0.75 V
Lead Channel Pacing Threshold Pulse Width: 0.4 ms
Lead Channel Pacing Threshold Pulse Width: 0.4 ms
Lead Channel Sensing Intrinsic Amplitude: 0.75 mV
Lead Channel Sensing Intrinsic Amplitude: 0.75 mV
Lead Channel Sensing Intrinsic Amplitude: 18.625 mV
Lead Channel Sensing Intrinsic Amplitude: 18.625 mV
Lead Channel Setting Pacing Amplitude: 2 V
Lead Channel Setting Pacing Amplitude: 2.5 V
Lead Channel Setting Pacing Pulse Width: 0.4 ms
Lead Channel Setting Sensing Sensitivity: 2 mV
Zone Setting Status: 755011
Zone Setting Status: 755011

## 2023-01-16 NOTE — Progress Notes (Signed)
Remote pacemaker transmission.   

## 2023-02-04 ENCOUNTER — Encounter: Payer: Self-pay | Admitting: Adult Health

## 2023-02-04 ENCOUNTER — Ambulatory Visit (INDEPENDENT_AMBULATORY_CARE_PROVIDER_SITE_OTHER): Payer: Medicare Other | Admitting: Adult Health

## 2023-02-04 VITALS — BP 156/85 | HR 73 | Ht 61.6 in | Wt 123.8 lb

## 2023-02-04 DIAGNOSIS — E785 Hyperlipidemia, unspecified: Secondary | ICD-10-CM | POA: Diagnosis not present

## 2023-02-04 DIAGNOSIS — I63412 Cerebral infarction due to embolism of left middle cerebral artery: Secondary | ICD-10-CM

## 2023-02-04 NOTE — Patient Instructions (Signed)
Your Plan:  Continue ASA  Blood pressure goal <130/90 Cholesterol LDL goal <70 Diabetes goal A1c <7 Monitor diet and try to exercise   Thank you for coming to see us at [facility]. I hope we have been able to provide you high quality care today.  You may receive a patient satisfaction survey over the next few weeks. We would appreciate your feedback and comments so that we may continue to improve ourselves and the health of our patients.  

## 2023-02-04 NOTE — Progress Notes (Signed)
PATIENT: Michelle Winters DOB: Feb 09, 1932  REASON FOR VISIT: follow up HISTORY FROM: patient PRIMARY NEUROLOGIST: Dr. Pearlean Brownie  Chief Complaint  Patient presents with   Hospitalization Follow-up    Rm 19. Alone. CVA f/u. Reported previous residual speech deficits, it has improved recently.     HISTORY OF PRESENT ILLNESS: Today 02/04/23  Michelle Winters is a 89 y.o. female here for hospital follow-up after left frontal infarct. Returns today for follow-up. Reports that speech is better. Only lasted a couple of days. No weakness or numbness noted.  She reports that she is now just on aspirin.  She does check her blood pressure at home and this morning her blood pressure was 128/71.  She did 5 weeks of speech therapy.  She feels that her speech is back at his baseline.  Her cholesterol was elevated they were considering starting Leqvio in the hospital as she cannot tolerate statins in the past.  However she states that she was told that she did not need cholesterol medicine.  Reports that she has followed up with her PCP.  HISTORY Michelle Winters is a 89 y.o. female with history of CAD, cardiomyopathy status post pacemaker, sick sinus syndrome, CKD stage III, hypertension, hypothyroidism, left IJ DVT 2023 presenting with aphasia and word finding difficulties.  Patient was out of the TNK window.  CTA showed no LVO, CTP revealed small penumbra in left MCA territory.  MRI revealed a left frontal infarct.   Acute left frontal infarct Etiology: Embolic from cryptogenic source code Stroke CT head  No acute abnormality.  Mild chronic Small vessel disease.  ASPECTS 10.    CTA head & neck  No intracranial LVO 2 mm aneurysm arising from the cavernous right ICA 2 mm projecting vascular protrusion arising from left ICA, possible aneurysm. CT perfusion  8 mm region of hyperperfused parenchyma within the left frontal lobe (MCA vascular territory) MRI   Acute left frontal infarct   2D Echo:  pending   LDL 122 HgbA1c 5.5 VTE prophylaxis - lovenox  No antithrombotic prior to admission, now on aspirin 81 mg daily and clopidogrel 75 mg daily.  Recommend aspirin and Plavix for 3 weeks followed by aspirin for monotherapy. Therapy recommendations: No follow-up needed Disposition: Pending  REVIEW OF SYSTEMS: Out of a complete 14 system review of symptoms, the patient complains only of the following symptoms, and all other reviewed systems are negative.  ALLERGIES: Allergies  Allergen Reactions   Lisinopril Other (See Comments)    Chest tightness   Simvastatin Other (See Comments)    Muscle pain   Welchol [Colesevelam Hcl] Other (See Comments)    Muscle pain    Amiodarone Other (See Comments)    Lowered heart rate    Ciprofloxacin Nausea Only   Zetia [Ezetimibe] Nausea Only    HOME MEDICATIONS: Outpatient Medications Prior to Visit  Medication Sig Dispense Refill   acetaminophen (TYLENOL) 325 MG tablet Take 2 tablets (650 mg total) by mouth every 6 (six) hours as needed for mild pain, fever or headache (or temp > 37.5 C (99.5 F)).     aspirin 81 MG chewable tablet Chew 1 tablet (81 mg total) by mouth daily.     chlorthalidone (HYGROTON) 25 MG tablet Take 12.5 mg by mouth every morning.     Cyanocobalamin (VITAMIN B-12 IJ) Inject 1 Dose as directed every 30 (thirty) days.     levothyroxine (SYNTHROID, LEVOTHROID) 125 MCG tablet Take 125 mcg by mouth daily.  3   loratadine (CLARITIN) 10 MG tablet Take 10 mg by mouth daily as needed for allergies.      losartan (COZAAR) 50 MG tablet Take 1 tablet by mouth daily.     metoprolol succinate (TOPROL-XL) 50 MG 24 hr tablet Take 50 mg by mouth daily.     sodium chloride (OCEAN) 0.65 % SOLN nasal spray Place 1 spray into both nostrils as needed for congestion.     No facility-administered medications prior to visit.    PAST MEDICAL HISTORY: Past Medical History:  Diagnosis Date   Arthritis    "hands, fingers, toes"  (05/11/2014)   Bradycardia    CAD (coronary artery disease)    a. cath 07/01/13: LM no obs dz, LAD widely patent no obs dz, LCx normal in appearance, RCA  small, nondominant vessel. Catheter-induced spasm at the ostium, EF 50-55%   Cancer (HCC)    Cardiomyopathy (HCC)    a. Echo 10/15: Inferior and inferolateral AK,  Anterolateral HK, EF 40%;  b.  Echo (1/16):  EF 55-60%, Gr 1 DD, PASP 27, mild to mod TR, trivial AI, mild MR   Chronic kidney disease (CKD), stage III (moderate) (HCC)    Cystitis 03/29/1949   "tube to bladder was in a kink & I had polyps; surgically corrected"   DVT (deep venous thrombosis) (HCC)    Heart murmur    "slight"   Heart palpitations    a. paroxysmal SVT, PVCs, NSVT. b. echo 03/27/14: EF 50-55%, nl systolic function, AK of basalinferior myocardium, possible HK of the apicallateral and inferolateral myocardium, Ao trivial regurg, mild MR, mild LAE, trivial pulmonic regurg   Hypertension    Hypothyroidism    Osteopenia 09/27/2015   T score -2.4 FRAX 17%/6.5%   Pacemaker    a.  Medtronic MRI compatible ADVISA pulse generator serial number [id number].   Thyroid nodule    Wide-complex tachycardia 03/25/2014    PAST SURGICAL HISTORY: Past Surgical History:  Procedure Laterality Date   CARDIAC CATHETERIZATION  06/2013   normal coronary arteries   CATARACT EXTRACTION W/ INTRAOCULAR LENS  IMPLANT, BILATERAL Bilateral    CHOLECYSTECTOMY     DILATION AND CURETTAGE OF UTERUS     HYSTEROSCOPY  1950's   "tube to bladder was in a kink & I had polyps; surgically corrected"   LEFT HEART CATHETERIZATION WITH CORONARY ANGIOGRAM [name] 07/01/2013   Procedure: LEFT HEART CATHETERIZATION WITH CORONARY ANGIOGRAM;  Surgeon: Micheline Chapman, MD;  Location: [facility] CATH LAB;  Service: Cardiovascular;  Laterality: [name];   PACEMAKER INSERTION  05/11/2014   MDT dual chamber Advisa pacemaker implanted by Dr Graciela Husbands -- MRI compatible system   PERMANENT PACEMAKER INSERTION [name] 05/11/2014    Procedure: PERMANENT PACEMAKER INSERTION;  Surgeon: Duke Salvia, MD;  Location: [facility] CATH LAB;  Service: Cardiovascular;  Laterality: [name];   Thyroid nodule biopsy     THYROIDECTOMY [name] 01/26/2016   Procedure: TOTAL THYROIDECTOMY;  Surgeon: Darnell Level, MD;  Location: [facility] OR;  Service: General;  Laterality: [name];   TONSILLECTOMY     URETER SURGERY Left many years ago? 1960    FAMILY HISTORY: Family History  Problem Relation Age of Onset   Multiple sclerosis Mother    Heart attack Father    Cancer Sister        Skin cancer   Diabetes Sister    Heart disease Brother    Cancer Brother        Prostate   Leukemia Maternal Grandmother  Nephritis Paternal Grandmother     SOCIAL HISTORY: Social History   Socioeconomic History   Marital status: Married    Spouse name: Not on file   Number of children: Not on file   Years of education: Not on file   Highest education level: Not on file  Occupational History   Not on file  Tobacco Use   Smoking status: Never   Smokeless tobacco: Never  Vaping Use   Vaping Use: Never used  Substance and Sexual Activity   Alcohol use: No    Alcohol/week: 0.0 standard drinks of alcohol   Drug use: No   Sexual activity: Never    Birth control/protection: Post-menopausal    Comment: 1st intercourse 62 yo-1 partner  Other Topics Concern   Not on file  Social History Narrative   Not on file   Social Determinants of Health   Financial Resource Strain: Not on file  Food Insecurity: No Food Insecurity (12/16/2022)   Hunger Vital Sign    Worried About Running Out of Food in the Last Year: Never true    Ran Out of Food in the Last Year: Never true  Transportation Needs: No Transportation Needs (12/16/2022)   PRAPARE - Administrator, Civil Service (Medical): No    Lack of Transportation (Non-Medical): No  Physical Activity: Not on file  Stress: Not on file  Social Connections: Not on file  Intimate Partner Violence: Not At Risk  (12/16/2022)   Humiliation, Afraid, Rape, and Kick questionnaire    Fear of Current or Ex-Partner: No    Emotionally Abused: No    Physically Abused: No    Sexually Abused: No      PHYSICAL EXAM  Vitals:   02/04/23 1039  BP: (!) 156/85  Pulse: 73  Weight: 123 lb 12.8 oz (56.2 kg)  Height: 5' 1.6" (1.565 m)   Body mass index is 22.94 kg/m.  Generalized: Well developed, in no acute distress   Neurological examination  Mentation: Alert oriented to time, place, history taking. Follows all commands speech and language fluent Cranial nerve II-XII: Pupils were equal round reactive to light. Extraocular movements were full, visual field were full on confrontational test. Facial sensation and strength were normal.  Head turning and shoulder shrug  were normal and symmetric. Motor: The motor testing reveals 5 over 5 strength of all 4 extremities. Good symmetric motor tone is noted throughout.  Sensory: Sensory testing is intact to soft touch on all 4 extremities. No evidence of extinction is noted.  Coordination: Cerebellar testing reveals good finger-nose-finger and heel-to-shin bilaterally.  Gait and station: Uses a cane when ambulating.  Reflexes: Deep tendon reflexes are symmetric and normal bilaterally.   DIAGNOSTIC DATA (LABS, IMAGING, TESTING) - I reviewed patient records, labs, notes, testing and imaging myself where available.  Lab Results  Component Value Date   WBC 4.8 12/18/2022   HGB 11.5 (L) 12/18/2022   HCT 33.5 (L) 12/18/2022   MCV 89.1 12/18/2022   PLT 142 (L) 12/18/2022      Component Value Date/Time   NA 132 (L) 12/18/2022 0535   NA 139 04/01/2019 1035   K 3.9 12/18/2022 0535   CL 99 12/18/2022 0535   CO2 24 12/18/2022 0535   GLUCOSE 113 (H) 12/18/2022 0535   BUN 25 (H) 12/18/2022 0535   BUN 17 04/01/2019 1035   CREATININE 1.24 (H) 12/18/2022 0535   CALCIUM 8.4 (L) 12/18/2022 0535   PROT 5.6 (L) 12/18/2022 0535  ALBUMIN 2.9 (L) 12/18/2022 0535   AST  20 12/18/2022 0535   ALT 12 12/18/2022 0535   ALKPHOS 66 12/18/2022 0535   BILITOT 1.0 12/18/2022 0535   GFRNONAA 42 (L) 12/18/2022 0535   GFRAA 52 (L) 04/01/2019 1035   Lab Results  Component Value Date   CHOL 197 12/17/2022   HDL 55 12/17/2022   LDLCALC 122 (H) 12/17/2022   TRIG 102 12/17/2022   CHOLHDL 3.6 12/17/2022   Lab Results  Component Value Date   HGBA1C 5.5 12/17/2022   Lab Results  Component Value Date   VITAMINB12 225 12/18/2022   Lab Results  Component Value Date   TSH 1.635 12/16/2022      ASSESSMENT AND PLAN 89 y.o. year old female  has a past medical history of Arthritis, Bradycardia, CAD (coronary artery disease), Cancer (HCC), Cardiomyopathy (HCC), Chronic kidney disease (CKD), stage III (moderate) (HCC), Cystitis (03/29/1949), DVT (deep venous thrombosis) (HCC), Heart murmur, Heart palpitations, Hypertension, Hypothyroidism, Osteopenia (09/27/2015), Pacemaker, Thyroid nodule, and Wide-complex tachycardia (03/25/2014). here with:   Left Frontal Infarct   Continue aspirin 81 mg daily    for secondary stroke prevention.  Discussed secondary stroke prevention measures and importance of close PCP follow up for aggressive stroke risk factor management. I have gone over the pathophysiology of stroke, warning signs and symptoms, risk factors and their management in some detail with instructions to go to the closest emergency room for symptoms of concern. HTN: BP goal <130/90.  Elevated- managed by PCP HLD: LDL goal <70. Recent LDL 122. Will check lipid panel today. If LDL is elevated- advanced patient that we could refer to lipid clinic as she cannot tolerate statins in the past.  However the patient states that she prefers to discuss this with her PCP DMII: A1c goal<7.0. Recent A1c 5.5.  Encouraged patient to monitor diet and encouraged exercise FU with our office PRN       Butch Penny, MSN, NP-C 02/04/2023, 10:34 AM [facility] [address] [place], [place] [address] [phone]

## 2023-02-22 NOTE — Progress Notes (Signed)
I agree with the above plan 

## 2023-03-26 ENCOUNTER — Ambulatory Visit (INDEPENDENT_AMBULATORY_CARE_PROVIDER_SITE_OTHER): Payer: Medicare Other

## 2023-03-26 DIAGNOSIS — I495 Sick sinus syndrome: Secondary | ICD-10-CM

## 2023-03-29 LAB — CUP PACEART REMOTE DEVICE CHECK
Battery Remaining Longevity: 7 mo
Battery Voltage: 2.87 V
Brady Statistic AP VP Percent: 0.83 %
Brady Statistic AP VS Percent: 99.08 %
Brady Statistic AS VP Percent: 0 %
Brady Statistic AS VS Percent: 0.09 %
Brady Statistic RA Percent Paced: 99.67 %
Brady Statistic RV Percent Paced: 1.01 %
Date Time Interrogation Session: 20240830105155
Implantable Lead Connection Status: 753985
Implantable Lead Connection Status: 753985
Implantable Lead Implant Date: 20151014
Implantable Lead Implant Date: 20151014
Implantable Lead Location: 753859
Implantable Lead Location: 753860
Implantable Lead Model: 5076
Implantable Lead Model: 5076
Implantable Pulse Generator Implant Date: 20151014
Lead Channel Impedance Value: 361 Ohm
Lead Channel Impedance Value: 380 Ohm
Lead Channel Impedance Value: 418 Ohm
Lead Channel Impedance Value: 494 Ohm
Lead Channel Pacing Threshold Amplitude: 0.375 V
Lead Channel Pacing Threshold Amplitude: 0.625 V
Lead Channel Pacing Threshold Pulse Width: 0.4 ms
Lead Channel Pacing Threshold Pulse Width: 0.4 ms
Lead Channel Sensing Intrinsic Amplitude: 1.25 mV
Lead Channel Sensing Intrinsic Amplitude: 1.25 mV
Lead Channel Sensing Intrinsic Amplitude: 16.125 mV
Lead Channel Sensing Intrinsic Amplitude: 16.125 mV
Lead Channel Setting Pacing Amplitude: 2 V
Lead Channel Setting Pacing Amplitude: 2.5 V
Lead Channel Setting Pacing Pulse Width: 0.4 ms
Lead Channel Setting Sensing Sensitivity: 2 mV
Zone Setting Status: 755011
Zone Setting Status: 755011

## 2023-04-04 NOTE — Progress Notes (Signed)
Remote pacemaker transmission.   

## 2023-04-24 ENCOUNTER — Other Ambulatory Visit: Payer: Self-pay | Admitting: Physician Assistant

## 2023-04-24 DIAGNOSIS — Z780 Asymptomatic menopausal state: Secondary | ICD-10-CM

## 2023-06-25 ENCOUNTER — Ambulatory Visit (INDEPENDENT_AMBULATORY_CARE_PROVIDER_SITE_OTHER): Payer: Medicare Other

## 2023-06-25 DIAGNOSIS — I495 Sick sinus syndrome: Secondary | ICD-10-CM | POA: Diagnosis not present

## 2023-07-02 ENCOUNTER — Ambulatory Visit (INDEPENDENT_AMBULATORY_CARE_PROVIDER_SITE_OTHER): Payer: Medicare Other

## 2023-07-02 DIAGNOSIS — I495 Sick sinus syndrome: Secondary | ICD-10-CM

## 2023-07-02 LAB — CUP PACEART REMOTE DEVICE CHECK
Battery Remaining Longevity: 4 mo
Battery Voltage: 2.86 V
Brady Statistic AP VP Percent: 0.88 %
Brady Statistic AP VS Percent: 99.03 %
Brady Statistic AS VP Percent: 0 %
Brady Statistic AS VS Percent: 0.1 %
Brady Statistic RA Percent Paced: 99.35 %
Brady Statistic RV Percent Paced: 1.15 %
Date Time Interrogation Session: 20241203202008
Implantable Lead Connection Status: 753985
Implantable Lead Connection Status: 753985
Implantable Lead Implant Date: 20151014
Implantable Lead Implant Date: 20151014
Implantable Lead Location: 753859
Implantable Lead Location: 753860
Implantable Lead Model: 5076
Implantable Lead Model: 5076
Implantable Pulse Generator Implant Date: 20151014
Lead Channel Impedance Value: 342 Ohm
Lead Channel Impedance Value: 380 Ohm
Lead Channel Impedance Value: 418 Ohm
Lead Channel Impedance Value: 494 Ohm
Lead Channel Pacing Threshold Amplitude: 0.375 V
Lead Channel Pacing Threshold Amplitude: 0.75 V
Lead Channel Pacing Threshold Pulse Width: 0.4 ms
Lead Channel Pacing Threshold Pulse Width: 0.4 ms
Lead Channel Sensing Intrinsic Amplitude: 0.625 mV
Lead Channel Sensing Intrinsic Amplitude: 0.625 mV
Lead Channel Sensing Intrinsic Amplitude: 17.875 mV
Lead Channel Sensing Intrinsic Amplitude: 17.875 mV
Lead Channel Setting Pacing Amplitude: 2 V
Lead Channel Setting Pacing Amplitude: 2.5 V
Lead Channel Setting Pacing Pulse Width: 0.4 ms
Lead Channel Setting Sensing Sensitivity: 2 mV
Zone Setting Status: 755011
Zone Setting Status: 755011

## 2023-08-04 ENCOUNTER — Ambulatory Visit (INDEPENDENT_AMBULATORY_CARE_PROVIDER_SITE_OTHER): Payer: Medicare Other

## 2023-08-04 DIAGNOSIS — I495 Sick sinus syndrome: Secondary | ICD-10-CM

## 2023-08-06 LAB — CUP PACEART REMOTE DEVICE CHECK
Battery Remaining Longevity: 4 mo
Battery Voltage: 2.85 V
Brady Statistic AP VP Percent: 1.34 %
Brady Statistic AP VS Percent: 98.44 %
Brady Statistic AS VP Percent: 0 %
Brady Statistic AS VS Percent: 0.22 %
Brady Statistic RA Percent Paced: 98.97 %
Brady Statistic RV Percent Paced: 1.79 %
Date Time Interrogation Session: 20250107175731
Implantable Lead Connection Status: 753985
Implantable Lead Connection Status: 753985
Implantable Lead Implant Date: 20151014
Implantable Lead Implant Date: 20151014
Implantable Lead Location: 753859
Implantable Lead Location: 753860
Implantable Lead Model: 5076
Implantable Lead Model: 5076
Implantable Pulse Generator Implant Date: 20151014
Lead Channel Impedance Value: 342 Ohm
Lead Channel Impedance Value: 361 Ohm
Lead Channel Impedance Value: 399 Ohm
Lead Channel Impedance Value: 456 Ohm
Lead Channel Pacing Threshold Amplitude: 0.375 V
Lead Channel Pacing Threshold Amplitude: 0.625 V
Lead Channel Pacing Threshold Pulse Width: 0.4 ms
Lead Channel Pacing Threshold Pulse Width: 0.4 ms
Lead Channel Sensing Intrinsic Amplitude: 1.125 mV
Lead Channel Sensing Intrinsic Amplitude: 1.125 mV
Lead Channel Sensing Intrinsic Amplitude: 16.375 mV
Lead Channel Sensing Intrinsic Amplitude: 16.375 mV
Lead Channel Setting Pacing Amplitude: 2 V
Lead Channel Setting Pacing Amplitude: 2.5 V
Lead Channel Setting Pacing Pulse Width: 0.4 ms
Lead Channel Setting Sensing Sensitivity: 2 mV
Zone Setting Status: 755011
Zone Setting Status: 755011

## 2023-09-04 ENCOUNTER — Ambulatory Visit (INDEPENDENT_AMBULATORY_CARE_PROVIDER_SITE_OTHER): Payer: Self-pay

## 2023-09-04 DIAGNOSIS — I495 Sick sinus syndrome: Secondary | ICD-10-CM | POA: Diagnosis not present

## 2023-09-08 LAB — CUP PACEART REMOTE DEVICE CHECK
Battery Remaining Longevity: 3 mo
Battery Voltage: 2.84 V
Brady Statistic AP VP Percent: 1.04 %
Brady Statistic AP VS Percent: 98.84 %
Brady Statistic AS VP Percent: 0 %
Brady Statistic AS VS Percent: 0.12 %
Brady Statistic RA Percent Paced: 99.67 %
Brady Statistic RV Percent Paced: 1.38 %
Date Time Interrogation Session: 20250209150245
Implantable Lead Connection Status: 753985
Implantable Lead Connection Status: 753985
Implantable Lead Implant Date: 20151014
Implantable Lead Implant Date: 20151014
Implantable Lead Location: 753859
Implantable Lead Location: 753860
Implantable Lead Model: 5076
Implantable Lead Model: 5076
Implantable Pulse Generator Implant Date: 20151014
Lead Channel Impedance Value: 323 Ohm
Lead Channel Impedance Value: 361 Ohm
Lead Channel Impedance Value: 456 Ohm
Lead Channel Impedance Value: 532 Ohm
Lead Channel Pacing Threshold Amplitude: 0.375 V
Lead Channel Pacing Threshold Amplitude: 0.75 V
Lead Channel Pacing Threshold Pulse Width: 0.4 ms
Lead Channel Pacing Threshold Pulse Width: 0.4 ms
Lead Channel Sensing Intrinsic Amplitude: 1.5 mV
Lead Channel Sensing Intrinsic Amplitude: 1.5 mV
Lead Channel Sensing Intrinsic Amplitude: 19.875 mV
Lead Channel Sensing Intrinsic Amplitude: 19.875 mV
Lead Channel Setting Pacing Amplitude: 2 V
Lead Channel Setting Pacing Amplitude: 2.5 V
Lead Channel Setting Pacing Pulse Width: 0.4 ms
Lead Channel Setting Sensing Sensitivity: 2 mV
Zone Setting Status: 755011
Zone Setting Status: 755011

## 2023-09-15 NOTE — Addendum Note (Signed)
Addended by: Geralyn Flash D on: 09/15/2023 11:03 AM   Modules accepted: Orders, Level of Service

## 2023-09-15 NOTE — Progress Notes (Signed)
 Remote pacemaker transmission.

## 2023-10-09 NOTE — Addendum Note (Signed)
 Addended by: Elease Etienne A on: 10/09/2023 11:59 AM   Modules accepted: Orders

## 2023-10-09 NOTE — Progress Notes (Signed)
 Remote pacemaker transmission.

## 2023-11-06 ENCOUNTER — Ambulatory Visit (INDEPENDENT_AMBULATORY_CARE_PROVIDER_SITE_OTHER): Payer: Self-pay

## 2023-11-06 DIAGNOSIS — I495 Sick sinus syndrome: Secondary | ICD-10-CM | POA: Diagnosis not present

## 2023-11-09 LAB — CUP PACEART REMOTE DEVICE CHECK
Battery Remaining Longevity: 2 mo
Battery Voltage: 2.83 V
Brady Statistic AP VP Percent: 1.03 %
Brady Statistic AP VS Percent: 98.82 %
Brady Statistic AS VP Percent: 0 %
Brady Statistic AS VS Percent: 0.15 %
Brady Statistic RA Percent Paced: 99.63 %
Brady Statistic RV Percent Paced: 1.39 %
Date Time Interrogation Session: 20250411111220
Implantable Lead Connection Status: 753985
Implantable Lead Connection Status: 753985
Implantable Lead Implant Date: 20151014
Implantable Lead Implant Date: 20151014
Implantable Lead Location: 753859
Implantable Lead Location: 753860
Implantable Lead Model: 5076
Implantable Lead Model: 5076
Implantable Pulse Generator Implant Date: 20151014
Lead Channel Impedance Value: 361 Ohm
Lead Channel Impedance Value: 399 Ohm
Lead Channel Impedance Value: 475 Ohm
Lead Channel Impedance Value: 551 Ohm
Lead Channel Pacing Threshold Amplitude: 0.5 V
Lead Channel Pacing Threshold Amplitude: 0.625 V
Lead Channel Pacing Threshold Pulse Width: 0.4 ms
Lead Channel Pacing Threshold Pulse Width: 0.4 ms
Lead Channel Sensing Intrinsic Amplitude: 0.625 mV
Lead Channel Sensing Intrinsic Amplitude: 0.625 mV
Lead Channel Sensing Intrinsic Amplitude: 19.125 mV
Lead Channel Sensing Intrinsic Amplitude: 19.125 mV
Lead Channel Setting Pacing Amplitude: 2 V
Lead Channel Setting Pacing Amplitude: 2.5 V
Lead Channel Setting Pacing Pulse Width: 0.4 ms
Lead Channel Setting Sensing Sensitivity: 2 mV
Zone Setting Status: 755011
Zone Setting Status: 755011

## 2023-11-10 ENCOUNTER — Telehealth: Payer: Self-pay

## 2023-11-10 NOTE — Telephone Encounter (Signed)
 [facility]  Patient has not been seen in clinic since 2019.  Will need re-establish as well as

## 2023-11-10 NOTE — Telephone Encounter (Signed)
 Spoke with patient. She understands device at [facility]. Has appt to establish and discuss gen change with Dr. Marven Slimmer on 11/19/23 at 2pm. Patient verbalizes understanding.

## 2023-11-10 NOTE — Telephone Encounter (Signed)
 Monthly battery check.  Battery status at ERI as of 11/05/23. Normal device function. 1 VHR detection, 9 beat NSVT. Routing to triage for ERI. Follow up as scheduled monthly until generator change.

## 2023-11-11 ENCOUNTER — Ambulatory Visit
Admission: RE | Admit: 2023-11-11 | Discharge: 2023-11-11 | Disposition: A | Discharge status: Home / Self Care | Payer: Medicare Other | Source: Ambulatory Visit | Attending: Physician Assistant | Admitting: Physician Assistant

## 2023-11-11 DIAGNOSIS — Z780 Asymptomatic menopausal state: Secondary | ICD-10-CM

## 2023-11-18 NOTE — Progress Notes (Unsigned)
  Electrophysiology Office Follow up Visit Note:    Date:  11/19/2023   ID:  Michelle Winters, DOB 1931-10-16, MRN [id number]  PCP:  Karalee Oscar, PA  [facility] Cardiologist:  None  [facility] Electrophysiologist:  Boyce Byes, MD    Interval History:     Michelle Winters is a 89 y.o. female who presents for a follow up visit.   The patient was last seen by [name] in a virtual appointment on April 28, 2020.  The patient has a history of coronary artery disease, systolic heart failure, CKD 3, hypertension, hypothyroidism, pacemaker in situ.  The patient was lost to follow-up.  Her device recently reached ERI.  Her pacemaker was implanted in October 2015.  She atrially paces 99% and ventricular paces 1%.  She has been doing well no problems with her pacemaker.  No presyncope or syncope.  No recent fevers or chills.  Skin above the device has healed well.  No problems with her incision.       Past medical, surgical, social and family history were reviewed.  ROS:   Please see the history of present illness.    All other systems reviewed and are negative.  EKGs/Labs/Other Studies Reviewed:    The following studies were reviewed today:  July 15, 2022 chest x-ray 2 view personally reviewed.  Dual-chamber permanent pacemaker in the left prepectoral position with leads in appropriate positions.      Physical Exam:    VS:  BP (!) 146/88   Pulse 85   Ht 5\' 2"  (1.575 m)   Wt 129 lb 9.6 oz (58.8 kg)   SpO2 99%   BMI 23.70 kg/m     Wt Readings from Last 3 Encounters:  11/19/23 129 lb 9.6 oz (58.8 kg)  02/04/23 123 lb 12.8 oz (56.2 kg)  12/16/22 126 lb 8.7 oz (57.4 kg)     GEN: no distress CARD: RRR, No MRG.  CIED pocket well-healed RESP: No IWOB. CTAB.      ASSESSMENT:    1. Sick sinus syndrome (HCC)   2. Cardiac pacemaker in situ   3. Primary hypertension    PLAN:    In order of problems listed above:  #Sick sinus  syndrome #Permanent pacemaker in situ The device has reached ERI.  We discussed the generator change procedure in detail during today's clinic appointment and she wishes to proceed. She will hold her aspirin  for 5 days prior to the procedure to minimize the risks of bleeding.  Risks, benefits, and alternatives to pulse generator replacement were discussed in detail today.  The patient understands that risks include but are not limited to bleeding, infection, pneumothorax, perforation, tamponade, vascular damage, renal failure, MI, stroke, death, inappropriate shocks, damage to his existing leads, and lead dislodgement and wishes to proceed.  We will therefore schedule the procedure at the next available time.  #Hypertension Above goal today.  Recommend checking blood pressures 1-2 times per week at home and recording the values.  Recommend bringing these recordings to the primary care physician.     Signed, Harvie Liner, MD, [employer], [facility] 11/19/2023 2:22 PM    Electrophysiology [facility]

## 2023-11-18 NOTE — H&P (View-Only) (Signed)
  Electrophysiology Office Follow up Visit Note:    Date:  11/19/2023   ID:  Michelle Winters, DOB 1931-10-07, MRN [id number]  PCP:  Karalee Oscar, PA  [facility] Cardiologist:  None  [facility] Electrophysiologist:  Boyce Byes, MD    Interval History:     Michelle Winters is a 89 y.o. female who presents for a follow up visit.   The patient was last seen by Michelle Winters in a virtual appointment on April 28, 2020.  The patient has a history of coronary artery disease, systolic heart failure, CKD 3, hypertension, hypothyroidism, pacemaker in situ.  The patient was lost to follow-up.  Her device recently reached ERI.  Her pacemaker was implanted in October 2015.  She atrially paces 99% and ventricular paces 1%.  She has been doing well no problems with her pacemaker.  No presyncope or syncope.  No recent fevers or chills.  Skin above the device has healed well.  No problems with her incision.       Past medical, surgical, social and family history were reviewed.  ROS:   Please see the history of present illness.    All other systems reviewed and are negative.  EKGs/Labs/Other Studies Reviewed:    The following studies were reviewed today:  July 15, 2022 chest x-ray 2 view personally reviewed.  Dual-chamber permanent pacemaker in the left prepectoral position with leads in appropriate positions.      Physical Exam:    VS:  BP (!) 146/88   Pulse 85   Ht 5\' 2"  (1.575 m)   Wt 129 lb 9.6 oz (58.8 kg)   SpO2 99%   BMI 23.70 kg/m     Wt Readings from Last 3 Encounters:  11/19/23 129 lb 9.6 oz (58.8 kg)  02/04/23 123 lb 12.8 oz (56.2 kg)  12/16/22 126 lb 8.7 oz (57.4 kg)     GEN: no distress CARD: RRR, No MRG.  CIED pocket well-healed RESP: No IWOB. CTAB.      ASSESSMENT:    1. Sick sinus syndrome (HCC)   2. Cardiac pacemaker in situ   3. Primary hypertension    PLAN:    In order of problems listed above:  #Sick sinus  syndrome #Permanent pacemaker in situ The device has reached ERI.  We discussed the generator change procedure in detail during today's clinic appointment and she wishes to proceed. She will hold her aspirin  for 5 days prior to the procedure to minimize the risks of bleeding.  Risks, benefits, and alternatives to pulse generator replacement were discussed in detail today.  The patient understands that risks include but are not limited to bleeding, infection, pneumothorax, perforation, tamponade, vascular damage, renal failure, MI, stroke, death, inappropriate shocks, damage to his existing leads, and lead dislodgement and wishes to proceed.  We will therefore schedule the procedure at the next available time.  #Hypertension Above goal today.  Recommend checking blood pressures 1-2 times per week at home and recording the values.  Recommend bringing these recordings to the primary care physician.     Signed, Harvie Liner, MD, [facility], [facility] 11/19/2023 2:22 PM    Electrophysiology [facility]

## 2023-11-19 ENCOUNTER — Other Ambulatory Visit: Payer: Self-pay

## 2023-11-19 ENCOUNTER — Encounter: Payer: Self-pay | Admitting: Cardiology

## 2023-11-19 ENCOUNTER — Ambulatory Visit: Attending: Cardiology | Admitting: Cardiology

## 2023-11-19 VITALS — BP 146/88 | HR 85 | Ht 62.0 in | Wt 129.6 lb

## 2023-11-19 DIAGNOSIS — I495 Sick sinus syndrome: Secondary | ICD-10-CM | POA: Diagnosis not present

## 2023-11-19 DIAGNOSIS — Z950 Presence of cardiac pacemaker: Secondary | ICD-10-CM | POA: Diagnosis not present

## 2023-11-19 DIAGNOSIS — I10 Essential (primary) hypertension: Secondary | ICD-10-CM

## 2023-11-19 NOTE — Patient Instructions (Signed)
 Medication Instructions:  Your physician recommends that you continue on your current medications as directed. Please refer to the Current Medication list given to you today.  *If you need a refill on your cardiac medications before your next appointment, please call your pharmacy*  Lab Work: BMET and CBC - please have this done at any [facility] location within 30 days of your procedure   Testing/Procedures: Pacemaker Generator Change - see instruction letter give to you today  Follow-Up: At [facility], you and your health needs are our priority.  As part of our continuing mission to provide you with exceptional heart care, our providers are all part of one team.  This team includes your primary Cardiologist (physician) and Advanced Practice Providers or APPs (Physician Assistants and Nurse Practitioners) who all work together to provide you with the care you need, when you need it.  Your next appointment:   We will call you to schedule your follow up appointments       1st Floor: - Lobby - Registration  - Pharmacy  - Lab - Cafe  2nd Floor: - PV Lab - Diagnostic Testing (echo, CT, nuclear med)  3rd Floor: - Vacant  4th Floor: - TCTS (cardiothoracic surgery) - AFib Clinic - Structural Heart Clinic - Vascular Surgery  - Vascular Ultrasound  5th Floor: - HeartCare Cardiology (general and EP) - Clinical Pharmacy for coumadin, hypertension, lipid, weight-loss medications, and med management appointments    Valet parking services will be available as well.

## 2023-11-20 LAB — CBC WITH DIFFERENTIAL/PLATELET
Basophils Absolute: 0.1 10*3/uL (ref 0.0–0.2)
Basos: 1 %
EOS (ABSOLUTE): 0.2 10*3/uL (ref 0.0–0.4)
Eos: 3 %
Hematocrit: 34 % (ref 34.0–46.6)
Hemoglobin: 11.4 g/dL (ref 11.1–15.9)
Immature Grans (Abs): 0 10*3/uL (ref 0.0–0.1)
Immature Granulocytes: 1 %
Lymphocytes Absolute: 1.2 10*3/uL (ref 0.7–3.1)
Lymphs: 22 %
MCH: 30.7 pg (ref 26.6–33.0)
MCHC: 33.5 g/dL (ref 31.5–35.7)
MCV: 92 fL (ref 79–97)
Monocytes Absolute: 0.4 10*3/uL (ref 0.1–0.9)
Monocytes: 8 %
Neutrophils Absolute: 3.5 10*3/uL (ref 1.4–7.0)
Neutrophils: 65 %
Platelets: 166 10*3/uL (ref 150–450)
RBC: 3.71 x10E6/uL — ABNORMAL LOW (ref 3.77–5.28)
RDW: 12.6 % (ref 11.7–15.4)
WBC: 5.4 10*3/uL (ref 3.4–10.8)

## 2023-11-20 LAB — BASIC METABOLIC PANEL WITH GFR
BUN/Creatinine Ratio: 19 (ref 12–28)
BUN: 25 mg/dL (ref 10–36)
CO2: 23 mmol/L (ref 20–29)
Calcium: 8.4 mg/dL — ABNORMAL LOW (ref 8.7–10.3)
Chloride: 102 mmol/L (ref 96–106)
Creatinine, Ser: 1.33 mg/dL — ABNORMAL HIGH (ref 0.57–1.00)
Glucose: 92 mg/dL (ref 70–99)
Potassium: 4.6 mmol/L (ref 3.5–5.2)
Sodium: 140 mmol/L (ref 134–144)
eGFR: 38 mL/min/{1.73_m2} — ABNORMAL LOW (ref >59)

## 2023-12-08 ENCOUNTER — Ambulatory Visit (INDEPENDENT_AMBULATORY_CARE_PROVIDER_SITE_OTHER): Payer: Self-pay

## 2023-12-08 DIAGNOSIS — I495 Sick sinus syndrome: Secondary | ICD-10-CM | POA: Diagnosis not present

## 2023-12-09 ENCOUNTER — Telehealth (HOSPITAL_COMMUNITY): Payer: Self-pay

## 2023-12-09 LAB — CUP PACEART REMOTE DEVICE CHECK
Battery Remaining Longevity: 2 mo
Battery Remaining Longevity: 2 mo
Battery Voltage: 2.82 V
Battery Voltage: 2.82 V
Brady Statistic AP VP Percent: 1.21 %
Brady Statistic AP VP Percent: 1.21 %
Brady Statistic AP VS Percent: 98.61 %
Brady Statistic AP VS Percent: 98.61 %
Brady Statistic AS VP Percent: 0 %
Brady Statistic AS VP Percent: 0 %
Brady Statistic AS VS Percent: 0.17 %
Brady Statistic AS VS Percent: 0.17 %
Brady Statistic RA Percent Paced: 99.42 %
Brady Statistic RA Percent Paced: 99.42 %
Brady Statistic RV Percent Paced: 1.6 %
Brady Statistic RV Percent Paced: 1.6 %
Date Time Interrogation Session: 20250513115531
Date Time Interrogation Session: 20250513115531
Implantable Lead Connection Status: 753985
Implantable Lead Connection Status: 753985
Implantable Lead Connection Status: 753985
Implantable Lead Connection Status: 753985
Implantable Lead Implant Date: 20151014
Implantable Lead Implant Date: 20151014
Implantable Lead Implant Date: 20151014
Implantable Lead Implant Date: 20151014
Implantable Lead Location: 753859
Implantable Lead Location: 753860
Implantable Lead Location: 753859
Implantable Lead Location: 753860
Implantable Lead Model: 5076
Implantable Lead Model: 5076
Implantable Lead Model: 5076
Implantable Lead Model: 5076
Implantable Pulse Generator Implant Date: 20151014
Implantable Pulse Generator Implant Date: 20151014
Lead Channel Impedance Value: 342 Ohm
Lead Channel Impedance Value: 380 Ohm
Lead Channel Impedance Value: 418 Ohm
Lead Channel Impedance Value: 475 Ohm
Lead Channel Impedance Value: 342 Ohm
Lead Channel Impedance Value: 380 Ohm
Lead Channel Impedance Value: 418 Ohm
Lead Channel Impedance Value: 475 Ohm
Lead Channel Pacing Threshold Amplitude: 0.375 V
Lead Channel Pacing Threshold Amplitude: 0.625 V
Lead Channel Pacing Threshold Amplitude: 0.375 V
Lead Channel Pacing Threshold Amplitude: 0.625 V
Lead Channel Pacing Threshold Pulse Width: 0.4 ms
Lead Channel Pacing Threshold Pulse Width: 0.4 ms
Lead Channel Pacing Threshold Pulse Width: 0.4 ms
Lead Channel Pacing Threshold Pulse Width: 0.4 ms
Lead Channel Sensing Intrinsic Amplitude: 0.75 mV
Lead Channel Sensing Intrinsic Amplitude: 0.75 mV
Lead Channel Sensing Intrinsic Amplitude: 17.625 mV
Lead Channel Sensing Intrinsic Amplitude: 17.625 mV
Lead Channel Sensing Intrinsic Amplitude: 0.75 mV
Lead Channel Sensing Intrinsic Amplitude: 0.75 mV
Lead Channel Sensing Intrinsic Amplitude: 17.625 mV
Lead Channel Sensing Intrinsic Amplitude: 17.625 mV
Lead Channel Setting Pacing Amplitude: 2 V
Lead Channel Setting Pacing Amplitude: 2.5 V
Lead Channel Setting Pacing Amplitude: 2 V
Lead Channel Setting Pacing Amplitude: 2.5 V
Lead Channel Setting Pacing Pulse Width: 0.4 ms
Lead Channel Setting Pacing Pulse Width: 0.4 ms
Lead Channel Setting Sensing Sensitivity: 2 mV
Lead Channel Setting Sensing Sensitivity: 2 mV
Zone Setting Status: 755011
Zone Setting Status: 755011
Zone Setting Status: 755011
Zone Setting Status: 755011

## 2023-12-09 NOTE — Telephone Encounter (Signed)
 Spoke with patient to discuss upcoming procedure.   Confirmed patient is scheduled for a PPM generator change on Monday, May 19 with Dr. Harvie Liner. Instructed patient to arrive at the Main Entrance A at [facility]: [address] [place], [place] [address] and check in at Admitting at 1:00 PM.   Labs completed  Any recent signs of acute illness or been started on antibiotics? No Any new medications started? No Any medications to hold? Hold ASA 5 days- last dose today, 5/13. Medication instructions:  On the morning of your procedure you may take all your other medications with a small sip water.  No eating or drinking after midnight prior to procedure.   The night before your procedure and the morning of your procedure, wash thoroughly with the CHG surgical soap from the neck down, paying special attention to the area where your procedure will be performed.  Advised of plan to go home the same day and will only stay overnight if medically necessary. You MUST have a responsible adult to drive you home and MUST be with you the first 24 hours after you arrive home.  Patient verbalized understanding to all instructions provided and agreed to proceed with procedure.

## 2023-12-12 NOTE — Pre-Procedure Instructions (Signed)
 Instructed patient on the following items: Arrival time 1230 Nothing to eat or drink after midnight No meds AM of procedure Responsible person to drive you home and stay with you for 24 hrs Wash with special soap night before and morning of procedure If on anti-coagulant drug instructions ASA- last dose 5/13

## 2023-12-14 ENCOUNTER — Ambulatory Visit: Payer: Self-pay | Admitting: Cardiology

## 2023-12-15 ENCOUNTER — Encounter (HOSPITAL_COMMUNITY)
Admission: RE | Disposition: A | Discharge status: Home / Self Care | Payer: Self-pay | Source: Home / Self Care | Attending: Cardiology

## 2023-12-15 ENCOUNTER — Ambulatory Visit (HOSPITAL_COMMUNITY)
Admission: RE | Admit: 2023-12-15 | Discharge: 2023-12-15 | Disposition: A | Discharge status: Home / Self Care | Attending: Cardiology | Admitting: Cardiology

## 2023-12-15 ENCOUNTER — Other Ambulatory Visit: Payer: Self-pay

## 2023-12-15 DIAGNOSIS — E039 Hypothyroidism, unspecified: Secondary | ICD-10-CM | POA: Insufficient documentation

## 2023-12-15 DIAGNOSIS — Z45010 Encounter for checking and testing of cardiac pacemaker pulse generator [battery]: Secondary | ICD-10-CM | POA: Diagnosis present

## 2023-12-15 DIAGNOSIS — I130 Hypertensive heart and chronic kidney disease with heart failure and stage 1 through stage 4 chronic kidney disease, or unspecified chronic kidney disease: Secondary | ICD-10-CM | POA: Insufficient documentation

## 2023-12-15 DIAGNOSIS — N183 Chronic kidney disease, stage 3 unspecified: Secondary | ICD-10-CM | POA: Diagnosis not present

## 2023-12-15 DIAGNOSIS — I495 Sick sinus syndrome: Secondary | ICD-10-CM | POA: Diagnosis not present

## 2023-12-15 DIAGNOSIS — I2510 Atherosclerotic heart disease of native coronary artery without angina pectoris: Secondary | ICD-10-CM | POA: Diagnosis not present

## 2023-12-15 DIAGNOSIS — Z7982 Long term (current) use of aspirin: Secondary | ICD-10-CM | POA: Insufficient documentation

## 2023-12-15 HISTORY — PX: PPM GENERATOR CHANGEOUT: EP1233

## 2023-12-15 SURGERY — PPM GENERATOR CHANGEOUT

## 2023-12-15 MED ORDER — ACETAMINOPHEN 325 MG PO TABS: 325.0000 mg | ORAL_TABLET | ORAL | Status: DC | PRN

## 2023-12-15 MED ORDER — SODIUM CHLORIDE 0.9 % IV SOLN
INTRAVENOUS | Status: AC
  Filled 2023-12-15: qty 2

## 2023-12-15 MED ORDER — LIDOCAINE HCL (PF) 1 % IJ SOLN
INTRAMUSCULAR | Status: AC
  Filled 2023-12-15: qty 30

## 2023-12-15 MED ORDER — CEFAZOLIN SODIUM-DEXTROSE 2-4 GM/100ML-% IV SOLN
2.0000 g | INTRAVENOUS | Status: AC
  Administered 2023-12-15: 2 g via INTRAVENOUS

## 2023-12-15 MED ORDER — CHLORHEXIDINE GLUCONATE 4 % EX SOLN
4.0000 | Freq: Once | CUTANEOUS | Status: AC
  Administered 2023-12-15: 4 via TOPICAL
  Filled 2023-12-15: qty 60

## 2023-12-15 MED ORDER — CEFAZOLIN SODIUM-DEXTROSE 2-4 GM/100ML-% IV SOLN
INTRAVENOUS | Status: AC
  Filled 2023-12-15: qty 100

## 2023-12-15 MED ORDER — ASPIRIN 81 MG PO TBEC: 81.0000 mg | DELAYED_RELEASE_TABLET | Freq: Every day | ORAL | 12 refills | Status: DC

## 2023-12-15 MED ORDER — POVIDONE-IODINE 10 % EX SWAB
2.0000 | Freq: Once | CUTANEOUS | Status: AC
  Administered 2023-12-15: 2 via TOPICAL

## 2023-12-15 MED ORDER — ONDANSETRON HCL 4 MG/2ML IJ SOLN
INTRAMUSCULAR | Status: AC
  Filled 2023-12-15: qty 2

## 2023-12-15 MED ORDER — ONDANSETRON HCL 4 MG/2ML IJ SOLN: 4.0000 mg | Freq: Four times a day (QID) | INTRAMUSCULAR | Status: DC | PRN

## 2023-12-15 MED ORDER — LIDOCAINE HCL (PF) 1 % IJ SOLN
INTRAMUSCULAR | Status: DC | PRN
Start: 2023-12-15 — End: 2023-12-15
  Administered 2023-12-15: 60 mL

## 2023-12-15 MED ORDER — ONDANSETRON HCL 4 MG/2ML IJ SOLN
INTRAMUSCULAR | Status: DC | PRN
  Administered 2023-12-15: 4 mg via INTRAVENOUS

## 2023-12-15 MED ORDER — SODIUM CHLORIDE 0.9 % IV SOLN: INTRAVENOUS | Status: DC

## 2023-12-15 MED ORDER — SODIUM CHLORIDE 0.9 % IV SOLN
80.0000 mg | INTRAVENOUS | Status: AC
  Administered 2023-12-15: 80 mg

## 2023-12-15 SURGICAL SUPPLY — 6 items
CABLE SURGICAL S-101-97-12 (CABLE) ×2 IMPLANT
IPG PACE AZUR XT DR MRI W1DR01 (Pacemaker) IMPLANT
PAD DEFIB RADIO PHYSIO CONN (PAD) ×2 IMPLANT
POUCH AIGIS-R ANTIBACT PPM (Mesh General) ×1 IMPLANT
POUCH AIGIS-R ANTIBACT PPM MED (Mesh General) IMPLANT
TRAY PACEMAKER INSERTION (PACKS) ×2 IMPLANT

## 2023-12-15 NOTE — Discharge Instructions (Signed)

## 2023-12-15 NOTE — Interval H&P Note (Signed)
 History and Physical Interval Note:  12/15/2023 2:38 PM  Michelle Winters  has presented today for surgery, with the diagnosis of eri.  The various methods of treatment have been discussed with the patient and family. After consideration of risks, benefits and other options for treatment, the patient has consented to  Procedure(s): PPM GENERATOR CHANGEOUT ([name]) as a surgical intervention.  The patient's history has been reviewed, patient examined, no change in status, stable for surgery.  I have reviewed the patient's chart and labs.  Questions were answered to the patient's satisfaction.     Johnathyn Viscomi T Deepa Barthel

## 2023-12-16 ENCOUNTER — Encounter (HOSPITAL_COMMUNITY): Payer: Self-pay | Admitting: Cardiology

## 2023-12-16 ENCOUNTER — Telehealth: Payer: Self-pay | Admitting: Cardiology

## 2023-12-16 NOTE — Telephone Encounter (Signed)
 Called patient and instructed education above. Patient advised to call if she has any further questions or concerns. Patient voiced understanding and appreciative of call.

## 2023-12-16 NOTE — Telephone Encounter (Signed)
 Patient has a few questions she would like addressed post-op:  When she was called about having surgery she was advised to hold Aspirin  for 5 days, but she says instructions advise to begin taking it on 5/23. Please advise when to to  start taking.  Patient would like to know if she can sleep on her left side. She usually does, but she doesn't know how much pressure she can put of that side, so she's been sleeping on her back.  Patient also requests instructions on changing dressing.   Patient would also like to know if it would be alright for her to sweep and vacuum.

## 2023-12-16 NOTE — Telephone Encounter (Signed)
 Pt returning call to nurse. She is having phone trouble if you call and get voicemail try calling her again because it is sometimes not letting her answer

## 2023-12-16 NOTE — Telephone Encounter (Signed)
 Attempted to contact patient. No answer, left message to call back.  Gen change only.   - Resume Aspirin  12/19/2023 as indicated on MAR.  - Avoid laying on left side until wound check to make sure wound is well healed. If wound is okay at wound check, Michelle Winters may sleep on left side.  -Michelle Winters can remove large clear/gauze dressing 24 hours after the gen change was completed. Be sure NOT to remove steri-strips. Keep them clean and dry until wound check apt.  - Avoid sweeping and vacuuming until wound check. If wound is well healed at wound check Michelle Winters may resume activity.   Patient may also have the opportunity for further questions/education at wound check with RN. If a question arises between now and wound check patient can call the device clinic at [phone] to inquire further.

## 2023-12-19 NOTE — Addendum Note (Signed)
 Addended by: Lott Rouleau A on: 12/19/2023 09:54 AM   Modules accepted: Orders

## 2023-12-19 NOTE — Progress Notes (Signed)
 Remote pacemaker transmission.

## 2023-12-30 ENCOUNTER — Ambulatory Visit: Attending: Cardiology

## 2023-12-30 DIAGNOSIS — I495 Sick sinus syndrome: Secondary | ICD-10-CM | POA: Diagnosis not present

## 2023-12-30 LAB — CUP PACEART INCLINIC DEVICE CHECK
Battery Remaining Longevity: 162 mo
Battery Voltage: 3.23 V
Brady Statistic AP VP Percent: 0.2 %
Brady Statistic AP VS Percent: 93.91 %
Brady Statistic AS VP Percent: 0 %
Brady Statistic AS VS Percent: 5.88 %
Brady Statistic RA Percent Paced: 99.27 %
Brady Statistic RV Percent Paced: 0.21 %
Date Time Interrogation Session: 20250603130334
Implantable Lead Connection Status: 753985
Implantable Lead Connection Status: 753985
Implantable Lead Implant Date: 20151014
Implantable Lead Implant Date: 20151014
Implantable Lead Location: 753859
Implantable Lead Location: 753860
Implantable Lead Model: 5076
Implantable Lead Model: 5076
Implantable Pulse Generator Implant Date: 20250519
Lead Channel Impedance Value: 342 Ohm
Lead Channel Impedance Value: 418 Ohm
Lead Channel Impedance Value: 456 Ohm
Lead Channel Impedance Value: 551 Ohm
Lead Channel Pacing Threshold Amplitude: 0.375 V
Lead Channel Pacing Threshold Amplitude: 0.5 V
Lead Channel Pacing Threshold Amplitude: 0.625 V
Lead Channel Pacing Threshold Amplitude: 0.75 V
Lead Channel Pacing Threshold Pulse Width: 0.4 ms
Lead Channel Pacing Threshold Pulse Width: 0.4 ms
Lead Channel Pacing Threshold Pulse Width: 0.4 ms
Lead Channel Pacing Threshold Pulse Width: 0.4 ms
Lead Channel Sensing Intrinsic Amplitude: 17.75 mV
Lead Channel Sensing Intrinsic Amplitude: 2.25 mV
Lead Channel Sensing Intrinsic Amplitude: 2.25 mV
Lead Channel Sensing Intrinsic Amplitude: 30.375 mV
Lead Channel Setting Pacing Amplitude: 1.5 V
Lead Channel Setting Pacing Amplitude: 2 V
Lead Channel Setting Pacing Pulse Width: 0.4 ms
Lead Channel Setting Sensing Sensitivity: 0.9 mV
Zone Setting Status: 755011
Zone Setting Status: 755011

## 2023-12-30 NOTE — Progress Notes (Signed)
 Normal dual chamber pacemaker wound check. Presenting rhythm: AP/VS- 63 with PVC's . Wound well healed. Routine testing performed. Thresholds, sensing, and impedances consistent with prior measurements. No episodes.  Pt enrolled in remote follow-up

## 2023-12-30 NOTE — Patient Instructions (Signed)

## 2024-01-03 ENCOUNTER — Ambulatory Visit: Payer: Self-pay | Admitting: Cardiology

## 2024-01-26 NOTE — Addendum Note (Signed)
 Addended by: TAWNI DRILLING D on: 01/26/2024 01:32 PM   Modules accepted: Orders

## 2024-01-26 NOTE — Progress Notes (Signed)
 Remote pacemaker transmission.

## 2024-02-27 ENCOUNTER — Inpatient Hospital Stay (HOSPITAL_COMMUNITY)
Admission: EM | Admit: 2024-02-27 | Discharge: 2024-03-01 | DRG: 062 | Disposition: A | Discharge status: Home / Self Care | Attending: Neurology | Admitting: Neurology

## 2024-02-27 ENCOUNTER — Emergency Department (HOSPITAL_COMMUNITY)

## 2024-02-27 DIAGNOSIS — E039 Hypothyroidism, unspecified: Secondary | ICD-10-CM | POA: Diagnosis present

## 2024-02-27 DIAGNOSIS — I10 Essential (primary) hypertension: Secondary | ICD-10-CM | POA: Diagnosis present

## 2024-02-27 DIAGNOSIS — I495 Sick sinus syndrome: Secondary | ICD-10-CM | POA: Diagnosis present

## 2024-02-27 DIAGNOSIS — M8580 Other specified disorders of bone density and structure, unspecified site: Secondary | ICD-10-CM | POA: Diagnosis present

## 2024-02-27 DIAGNOSIS — Z7982 Long term (current) use of aspirin: Secondary | ICD-10-CM

## 2024-02-27 DIAGNOSIS — I639 Cerebral infarction, unspecified: Principal | ICD-10-CM | POA: Diagnosis present

## 2024-02-27 DIAGNOSIS — I130 Hypertensive heart and chronic kidney disease with heart failure and stage 1 through stage 4 chronic kidney disease, or unspecified chronic kidney disease: Secondary | ICD-10-CM | POA: Diagnosis present

## 2024-02-27 DIAGNOSIS — R4701 Aphasia: Secondary | ICD-10-CM | POA: Diagnosis present

## 2024-02-27 DIAGNOSIS — Z8249 Family history of ischemic heart disease and other diseases of the circulatory system: Secondary | ICD-10-CM

## 2024-02-27 DIAGNOSIS — I6350 Cerebral infarction due to unspecified occlusion or stenosis of unspecified cerebral artery: Secondary | ICD-10-CM | POA: Diagnosis not present

## 2024-02-27 DIAGNOSIS — Z8673 Personal history of transient ischemic attack (TIA), and cerebral infarction without residual deficits: Secondary | ICD-10-CM | POA: Diagnosis not present

## 2024-02-27 DIAGNOSIS — E785 Hyperlipidemia, unspecified: Secondary | ICD-10-CM | POA: Diagnosis present

## 2024-02-27 DIAGNOSIS — Z79899 Other long term (current) drug therapy: Secondary | ICD-10-CM

## 2024-02-27 DIAGNOSIS — I5022 Chronic systolic (congestive) heart failure: Secondary | ICD-10-CM | POA: Diagnosis present

## 2024-02-27 DIAGNOSIS — R29810 Facial weakness: Secondary | ICD-10-CM | POA: Diagnosis present

## 2024-02-27 DIAGNOSIS — I129 Hypertensive chronic kidney disease with stage 1 through stage 4 chronic kidney disease, or unspecified chronic kidney disease: Secondary | ICD-10-CM | POA: Diagnosis not present

## 2024-02-27 DIAGNOSIS — Z7902 Long term (current) use of antithrombotics/antiplatelets: Secondary | ICD-10-CM

## 2024-02-27 DIAGNOSIS — Z808 Family history of malignant neoplasm of other organs or systems: Secondary | ICD-10-CM

## 2024-02-27 DIAGNOSIS — Z888 Allergy status to other drugs, medicaments and biological substances status: Secondary | ICD-10-CM

## 2024-02-27 DIAGNOSIS — I739 Peripheral vascular disease, unspecified: Secondary | ICD-10-CM | POA: Diagnosis not present

## 2024-02-27 DIAGNOSIS — Z833 Family history of diabetes mellitus: Secondary | ICD-10-CM

## 2024-02-27 DIAGNOSIS — N183 Chronic kidney disease, stage 3 unspecified: Secondary | ICD-10-CM | POA: Diagnosis present

## 2024-02-27 DIAGNOSIS — Z841 Family history of disorders of kidney and ureter: Secondary | ICD-10-CM

## 2024-02-27 DIAGNOSIS — Z950 Presence of cardiac pacemaker: Secondary | ICD-10-CM | POA: Diagnosis not present

## 2024-02-27 DIAGNOSIS — Z86718 Personal history of other venous thrombosis and embolism: Secondary | ICD-10-CM | POA: Diagnosis not present

## 2024-02-27 DIAGNOSIS — I2510 Atherosclerotic heart disease of native coronary artery without angina pectoris: Secondary | ICD-10-CM | POA: Diagnosis present

## 2024-02-27 DIAGNOSIS — I429 Cardiomyopathy, unspecified: Secondary | ICD-10-CM | POA: Diagnosis present

## 2024-02-27 DIAGNOSIS — Z881 Allergy status to other antibiotic agents status: Secondary | ICD-10-CM

## 2024-02-27 DIAGNOSIS — Z79890 Hormone replacement therapy (postmenopausal): Secondary | ICD-10-CM | POA: Diagnosis not present

## 2024-02-27 DIAGNOSIS — Z820 Family history of epilepsy and other diseases of the nervous system: Secondary | ICD-10-CM

## 2024-02-27 DIAGNOSIS — R29708 NIHSS score 8: Secondary | ICD-10-CM | POA: Diagnosis present

## 2024-02-27 DIAGNOSIS — I6389 Other cerebral infarction: Secondary | ICD-10-CM | POA: Diagnosis not present

## 2024-02-27 DIAGNOSIS — G459 Transient cerebral ischemic attack, unspecified: Secondary | ICD-10-CM | POA: Diagnosis not present

## 2024-02-27 DIAGNOSIS — I779 Disorder of arteries and arterioles, unspecified: Secondary | ICD-10-CM | POA: Diagnosis not present

## 2024-02-27 DIAGNOSIS — R29700 NIHSS score 0: Secondary | ICD-10-CM | POA: Diagnosis not present

## 2024-02-27 DIAGNOSIS — Z806 Family history of leukemia: Secondary | ICD-10-CM

## 2024-02-27 LAB — COMPREHENSIVE METABOLIC PANEL WITH GFR
ALT: 12 U/L (ref 0–44)
AST: 26 U/L (ref 15–41)
Albumin: 3.2 g/dL — ABNORMAL LOW (ref 3.5–5.0)
Alkaline Phosphatase: 68 U/L (ref 38–126)
Anion gap: 10 (ref 5–15)
BUN: 26 mg/dL — ABNORMAL HIGH (ref 8–23)
CO2: 23 mmol/L (ref 22–32)
Calcium: 8.6 mg/dL — ABNORMAL LOW (ref 8.9–10.3)
Chloride: 104 mmol/L (ref 98–111)
Creatinine, Ser: 1.06 mg/dL — ABNORMAL HIGH (ref 0.44–1.00)
GFR, Estimated: 50 mL/min — ABNORMAL LOW (ref >60)
Glucose, Bld: 109 mg/dL — ABNORMAL HIGH (ref 70–99)
Potassium: 4.1 mmol/L (ref 3.5–5.1)
Sodium: 137 mmol/L (ref 135–145)
Total Bilirubin: 0.5 mg/dL (ref 0.0–1.2)
Total Protein: 6.1 g/dL — ABNORMAL LOW (ref 6.5–8.1)

## 2024-02-27 LAB — I-STAT CHEM 8, ED
BUN: 26 mg/dL — ABNORMAL HIGH (ref 8–23)
Calcium, Ion: 1.08 mmol/L — ABNORMAL LOW (ref 1.15–1.40)
Chloride: 105 mmol/L (ref 98–111)
Creatinine, Ser: 1.2 mg/dL — ABNORMAL HIGH (ref 0.44–1.00)
Glucose, Bld: 108 mg/dL — ABNORMAL HIGH (ref 70–99)
HCT: 35 % — ABNORMAL LOW (ref 36.0–46.0)
Hemoglobin: 11.9 g/dL — ABNORMAL LOW (ref 12.0–15.0)
Potassium: 4.1 mmol/L (ref 3.5–5.1)
Sodium: 138 mmol/L (ref 135–145)
TCO2: 22 mmol/L (ref 22–32)

## 2024-02-27 LAB — CBC
HCT: 36.9 % (ref 36.0–46.0)
Hemoglobin: 12.2 g/dL (ref 12.0–15.0)
MCH: 31.4 pg (ref 26.0–34.0)
MCHC: 33.1 g/dL (ref 30.0–36.0)
MCV: 94.9 fL (ref 80.0–100.0)
Platelets: 149 K/uL — ABNORMAL LOW (ref 150–400)
RBC: 3.89 MIL/uL (ref 3.87–5.11)
RDW: 12.6 % (ref 11.5–15.5)
WBC: 5.1 K/uL (ref 4.0–10.5)
nRBC: 0 % (ref 0.0–0.2)

## 2024-02-27 LAB — CBG MONITORING, ED: Glucose-Capillary: 107 mg/dL — ABNORMAL HIGH (ref 70–99)

## 2024-02-27 LAB — DIFFERENTIAL
Abs Immature Granulocytes: 0.01 K/uL (ref 0.00–0.07)
Basophils Absolute: 0.1 K/uL (ref 0.0–0.1)
Basophils Relative: 1 %
Eosinophils Absolute: 0.4 K/uL (ref 0.0–0.5)
Eosinophils Relative: 8 %
Immature Granulocytes: 0 %
Lymphocytes Relative: 25 %
Lymphs Abs: 1.3 K/uL (ref 0.7–4.0)
Monocytes Absolute: 0.5 K/uL (ref 0.1–1.0)
Monocytes Relative: 11 %
Neutro Abs: 2.8 K/uL (ref 1.7–7.7)
Neutrophils Relative %: 55 %

## 2024-02-27 LAB — ETHANOL: Alcohol, Ethyl (B): <15 mg/dL (ref <15)

## 2024-02-27 LAB — APTT: aPTT: 26 s (ref 24–36)

## 2024-02-27 LAB — PROTIME-INR
INR: 1 (ref 0.8–1.2)
Prothrombin Time: 14.2 s (ref 11.4–15.2)

## 2024-02-27 LAB — MRSA NEXT GEN BY PCR, NASAL: MRSA by PCR Next Gen: NOT DETECTED

## 2024-02-27 MED ORDER — SODIUM CHLORIDE 0.9 % IV SOLN: INTRAVENOUS | Status: DC

## 2024-02-27 MED ORDER — STROKE: EARLY STAGES OF RECOVERY BOOK
Freq: Once | Status: AC
  Filled 2024-02-27: qty 1

## 2024-02-27 MED ORDER — CHLORHEXIDINE GLUCONATE CLOTH 2 % EX PADS
6.0000 | MEDICATED_PAD | Freq: Every day | CUTANEOUS | Status: DC
  Administered 2024-02-27 – 2024-03-01 (×4): 6 via TOPICAL

## 2024-02-27 MED ORDER — ACETAMINOPHEN 650 MG RE SUPP: 650.0000 mg | RECTAL | Status: DC | PRN

## 2024-02-27 MED ORDER — LABETALOL HCL 5 MG/ML IV SOLN
10.0000 mg | Freq: Once | INTRAVENOUS | Status: AC | PRN
  Administered 2024-02-27: 10 mg via INTRAVENOUS
  Filled 2024-02-27: qty 4

## 2024-02-27 MED ORDER — TENECTEPLASE FOR STROKE
0.2500 mg/kg | PACK | Freq: Once | INTRAVENOUS | Status: AC
  Administered 2024-02-27: 15 mg via INTRAVENOUS
  Filled 2024-02-27: qty 10

## 2024-02-27 MED ORDER — LEVOTHYROXINE SODIUM 25 MCG PO TABS
125.0000 ug | ORAL_TABLET | Freq: Every day | ORAL | Status: DC
  Administered 2024-02-28 – 2024-03-01 (×3): 125 ug via ORAL
  Filled 2024-02-27 (×3): qty 1

## 2024-02-27 MED ORDER — PANTOPRAZOLE SODIUM 40 MG IV SOLR
40.0000 mg | Freq: Every day | INTRAVENOUS | Status: DC
  Administered 2024-02-27 – 2024-02-28 (×2): 40 mg via INTRAVENOUS
  Filled 2024-02-27 (×2): qty 10

## 2024-02-27 MED ORDER — IOHEXOL 350 MG/ML SOLN
75.0000 mL | Freq: Once | INTRAVENOUS | Status: AC | PRN
Start: 2024-02-27 — End: 2024-02-27
  Administered 2024-02-27: 75 mL via INTRAVENOUS

## 2024-02-27 MED ORDER — NICARDIPINE HCL IN NACL 20-0.86 MG/200ML-% IV SOLN: 0.0000 mg/h | INTRAVENOUS | Status: DC | PRN

## 2024-02-27 MED ORDER — SODIUM CHLORIDE 0.9% FLUSH
3.0000 mL | Freq: Once | INTRAVENOUS | Status: AC
  Administered 2024-02-27: 3 mL via INTRAVENOUS

## 2024-02-27 MED ORDER — ACETAMINOPHEN 160 MG/5ML PO SOLN: 650.0000 mg | ORAL | Status: DC | PRN

## 2024-02-27 MED ORDER — ACETAMINOPHEN 325 MG PO TABS: 650.0000 mg | ORAL_TABLET | ORAL | Status: DC | PRN

## 2024-02-27 MED ORDER — LABETALOL HCL 5 MG/ML IV SOLN: 10.0000 mg | INTRAVENOUS | Status: DC | PRN

## 2024-02-27 NOTE — ED Provider Notes (Addendum)
 [place] EMERGENCY DEPARTMENT AT [facility] Provider Note   CSN: [id number] Arrival date & time: 02/27/24  1918     Patient presents with: Code Stroke   Michelle Winters is a 89 y.o. female.   89 year old female presents emergency department today with expressive aphasia this occurred just prior to arrival.  The patient is denying any headache.  This is an acute change from her baseline.  The patient is unable to provide any further history due to her aphasia.  The patient does have a right-sided facial droop as well.        Prior to Admission medications   Medication Sig Start Date End Date Taking? Authorizing Provider  acetaminophen  (TYLENOL ) 500 MG tablet Take 1,000 mg by mouth every 6 (six) hours as needed for moderate pain (pain score 4-6).    [provider]  aspirin  EC 81 MG tablet Take 1 tablet (81 mg total) by mouth daily. Swallow whole. 12/19/23   Cindie Ole DASEN, MD  calcium -vitamin D  (OSCAL WITH D) 500-5 MG-MCG tablet Take 1 tablet by mouth daily.    [provider]  chlorthalidone (HYGROTON) 25 MG tablet Take 25 mg by mouth every morning. 11/25/21   [provider]  cyanocobalamin  (VITAMIN B12) 1000 MCG tablet Take 1,000 mcg by mouth daily.    [provider]  levothyroxine  (SYNTHROID , LEVOTHROID) 125 MCG tablet Take 125 mcg by mouth daily before breakfast. 09/04/17   [provider]  loratadine  (CLARITIN ) 10 MG tablet Take 10 mg by mouth daily as needed for allergies.     [provider]  losartan  (COZAAR ) 50 MG tablet Take 50 mg by mouth daily.    [provider]  metoprolol  succinate (TOPROL -XL) 50 MG 24 hr tablet Take 50 mg by mouth daily. 11/24/21   [provider]    Allergies: Lisinopril, Simvastatin, Welchol [colesevelam hcl], Amiodarone , Ciprofloxacin, and Zetia [ezetimibe]    Review of Systems  Reason unable to perform ROS: Aphasia.    Updated Vital Signs BP (!) 166/62    Pulse (!) 59   Temp 98 F (36.7 C) (Oral)   Resp 12   Ht 5' 2 (1.575 m)   Wt 58.2 kg   SpO2 100%   BMI 23.47 kg/m   Physical Exam Vitals and nursing note reviewed.   Gen: NAD Eyes: PERRL, EOMI HEENT: no oropharyngeal swelling Neck: trachea midline Resp: clear to auscultation bilaterally Card: RRR, no murmurs, rubs, or gallops Abd: nontender, nondistended Extremities: no calf tenderness, no edema Vascular: 2+ radial pulses bilaterally, 2+ DP pulses bilaterally Neuro: Right-sided facial droop noted, expressive aphasia noted Skin: no rashes Psyc: acting appropriately   (all labs ordered are listed, but only abnormal results are displayed) Labs Reviewed  CBC - Abnormal; Notable for the following components:      Result Value   Platelets 149 (*)    All other components within normal limits  COMPREHENSIVE METABOLIC PANEL WITH GFR - Abnormal; Notable for the following components:   Glucose, Bld 109 (*)    BUN 26 (*)    Creatinine, Ser 1.06 (*)    Calcium  8.6 (*)    Total Protein 6.1 (*)    Albumin 3.2 (*)    GFR, Estimated 50 (*)    All other components within normal limits  CBG MONITORING, ED - Abnormal; Notable for the following components:   Glucose-Capillary 107 (*)    All other components within normal limits  I-STAT CHEM 8, ED -  Abnormal; Notable for the following components:   BUN 26 (*)    Creatinine, Ser 1.20 (*)    Glucose, Bld 108 (*)    Calcium , Ion 1.08 (*)    Hemoglobin 11.9 (*)    HCT 35.0 (*)    All other components within normal limits  MRSA NEXT GEN BY PCR, NASAL  PROTIME-INR  APTT  DIFFERENTIAL  ETHANOL  LIPID PANEL  HEMOGLOBIN A1C  CBG MONITORING, ED    EKG: None  Radiology: CT ANGIO HEAD NECK W WO CM (CODE STROKE) Result Date: 02/27/2024 EXAM: CTA HEAD AND NECK WITHOUT AND WITH 02/27/2024 07:41:00 PM TECHNIQUE: CTA of the head and neck was performed without and with the administration of intravenous contrast. Multiplanar 2D and/or 3D  reformatted images are provided for review. Automated exposure control, iterative reconstruction, and/or weight based adjustment of the mA/kV was utilized to reduce the radiation dose to as low as reasonably achievable. Stenosis of the internal carotid arteries measured using NASCET criteria. COMPARISON: None available CLINICAL HISTORY: Neuro deficit, acute, stroke suspected. FINDINGS: CTA NECK: AORTIC ARCH AND ARCH VESSELS: Calcific aortic atherosclerosis. No dissection or arterial injury. No significant stenosis of the brachiocephalic or subclavian arteries. CERVICAL CAROTID ARTERIES: Mild atherosclerotic calcification at the right carotid bifurcation without hemodynamically significant stenosis by NASCET criteria. Minimal left carotid bifurcation atherosclerosis. No dissection or arterial injury. CERVICAL VERTEBRAL ARTERIES: Left dominant vertebral system. Multifocal left V2 segment atherosclerosis without hemodynamically significant stenosis. No dissection or arterial injury. LUNGS AND MEDIASTINUM: Unremarkable. SOFT TISSUES: No acute abnormality. BONES: No acute abnormality. CTA HEAD: ANTERIOR CIRCULATION: No significant stenosis of the internal carotid arteries. No significant stenosis of the anterior cerebral arteries. No significant stenosis of the middle cerebral arteries. No aneurysm. POSTERIOR CIRCULATION: No significant stenosis of the posterior cerebral arteries. No significant stenosis of the basilar artery. No significant stenosis of the vertebral arteries. No aneurysm. OTHER: No dural venous sinus thrombosis on this non-dedicated study. IMPRESSION: 1. No large vessel occlusion, hemodynamically significant stenosis, or aneurysm in the head or neck. 2. Mild atherosclerotic calcification at the right carotid bifurcation without hemodynamically significant stenosis. 3. Left dominant vertebral system with multifocal left V2 segment atherosclerosis without hemodynamically significant stenosis.  Electronically signed by: Franky Stanford MD 02/27/2024 07:59 PM EDT RP Workstation: [id number]   CT HEAD CODE STROKE WO CONTRAST Result Date: 02/27/2024 EXAM: CT HEAD WITHOUT CONTRAST 02/27/2024 07:28:51 PM TECHNIQUE: CT of the head was performed without the administration of intravenous contrast. Automated exposure control, iterative reconstruction, and/or weight based adjustment of the mA/kV was utilized to reduce the radiation dose to as low as reasonably achievable. COMPARISON: 12/16/2022 CLINICAL HISTORY: Neuro deficit, acute, stroke suspected. FINDINGS: BRAIN AND VENTRICLES: Hypoattenuating foci in the cerebral white matter, most likely representing chronic small vessel disease. Old infarct of the left frontal operculum. Gray-white differentiation is preserved. No hydrocephalus. No extra-axial collection. No mass effect or midline shift. ASPECTS is 10. ORBITS: No acute abnormality. SINUSES: No acute abnormality. SOFT TISSUES AND SKULL: No acute soft tissue abnormality. No skull fracture. IMPRESSION: 1. No acute intracranial abnormality. 2. Hypoattenuating foci in the cerebral white matter, most likely representing chronic small vessel disease. 3. Old infarct of the left frontal operculum. 4. ASPECTS is 10. 5. Findings communicated to Dr. Aisha Seals at 7:35 pm. Electronically signed by: Franky Stanford MD 02/27/2024 07:36 PM EDT RP Workstation: [id number]     Procedures   Medications Ordered in the ED   stroke: early stages of recovery book (has no administration  in time range)  0.9 %  sodium chloride  infusion ( Intravenous New Bag/Given 02/27/24 2021)  acetaminophen  (TYLENOL ) tablet 650 mg (has no administration in time range)    Or  acetaminophen  (TYLENOL ) 160 MG/5ML solution 650 mg (has no administration in time range)    Or  acetaminophen  (TYLENOL ) suppository 650 mg (has no administration in time range)  labetalol  (NORMODYNE ) injection 10 mg (10 mg Intravenous Given 02/27/24 2120)    And   nicardipine  (CARDENE ) 20mg  in 0.86% saline IV infusion (0.1 mg/ml) (has no administration in time range)  pantoprazole  (PROTONIX ) injection 40 mg (40 mg Intravenous Given 02/27/24 2253)  levothyroxine  (SYNTHROID ) tablet 125 mcg (has no administration in time range)  Chlorhexidine  Gluconate Cloth 2 % PADS 6 each (6 each Topical Given 02/27/24 2030)  labetalol  (NORMODYNE ) injection 10 mg (has no administration in time range)  sodium chloride  flush (NS) 0.9 % injection 3 mL (3 mLs Intravenous Given 02/27/24 2030)  tenecteplase  (TNKASE ) injection for Stroke 15 mg (15 mg Intravenous Given 02/27/24 1930)  iohexol  (OMNIPAQUE ) 350 MG/ML injection 75 mL (75 mLs Intravenous Contrast Given 02/27/24 1942)                                    Medical Decision Making 89 year old female presenting to emergency department today as a code stroke due to right-sided facial droop and aphasia which is new.  The patient was evaluated by our stroke neurology team.  Is given thrombolytics.  She is admitted to the ICU for further evaluation management.  CRITICAL CARE Performed by: Prentice JONELLE Medicus   Total critical care time: 30 minutes  Critical care time was exclusive of separately billable procedures and treating other patients.  Critical care was necessary to treat or prevent imminent or life-threatening deterioration.  Critical care was time spent personally by me on the following activities: development of treatment plan with patient and/or surrogate as well as nursing, discussions with consultants, evaluation of patient's response to treatment, examination of patient, obtaining history from patient or surrogate, ordering and performing treatments and interventions, ordering and review of laboratory studies, ordering and review of radiographic studies, pulse oximetry and re-evaluation of patient's condition.   Amount and/or Complexity of Data Reviewed Labs: ordered. Radiology: ordered.  Risk Decision regarding  hospitalization.        Final diagnoses:  Acute CVA (cerebrovascular accident) [facility])    ED Discharge Orders     None          Medicus Prentice JONELLE, MD 02/28/24 9983    Medicus Prentice JONELLE, MD 02/28/24 [phone]

## 2024-02-27 NOTE — Progress Notes (Signed)
 PHARMACIST CODE STROKE RESPONSE  Notified to mix TNK at 1926 by Dr. Michaela TNK preparation completed at 1928 TNK given 1930  TNK dose = 15 mg IV over 5 seconds  Issues/delays encountered (if applicable): n/a  Dorn LITTIE Buttner 02/27/24 7:31 PM

## 2024-02-27 NOTE — Progress Notes (Signed)
 eLink Physician-Brief Progress Note Patient Name: Michelle Winters DOB: June 25, 1932 MRN: [id number]   Date of Service  02/27/2024  HPI/Events of Note  89 year old female with a history of CKD, heart failure with reduced ejection fraction (40%), history of DVT, chronic arthritis who initially presented via EMS for code stroke when family noticed facial droop and word salad.  Patient was given TNK at 70.  Vitals on initial tachypnea, bradycardic with mild hypertension.  Saturating 95% on room air.  Metabolic panel with creatinine, otherwise normal labs.  CT reviewed.  eICU Interventions  Continue Standard TNK precautions, Stroke workup pending  Goal SBP < 180, add PRN antihypertensives, Nicardipine   DVT prophylaxis with SCDs GI prophylaxis not indicated       [name] 02/27/2024, 9:19 PM

## 2024-02-27 NOTE — H&P (Signed)
 Neurology H&P  CC: Difficulty speaking  History is obtained from: Sons  HPI: Michelle Winters is a 89 y.o. female with a history of previous stroke a little over a year ago who presents with acute onset aphasia that abruptly started at 6:25 PM.  She was sitting on the porch, when it was noticed that she had an abrupt change in her speech.  Because of this EMS was called and they found her to be aphasic and activated a code stroke.  On arrival to the emergency department, she is noted to have mild right facial weakness and dense aphasia.  She was taken emergently for CT and while she was having a CT I discussed the risk/benefits and alternatives to IV TNK with the patient's son who agreed with proceeding if she had no hemorrhage.  The CT did not reveal any signs of hemorrhage and therefore IV TNK was administered.  She then underwent CTA which did not reveal any type of large vessel occlusion.  She has a history of previous stroke, but no persistent deficits.   LKW: 6:25 PM tpa given?:  Yes IR Thrombectomy?  No, no LVO Modified Rankin Scale: 2-Slight disability-UNABLE to perform all activities but does not need assistance NIHSS score: 8 1A: Level of Consciousness - 0 1B: Ask Month and Age - 2 1C: 'Blink Eyes' & 'Squeeze Hands' - 1 2: Test Horizontal Extraocular Movements - 0 3: Test Visual Fields - 2 4: Test Facial Palsy - 1 5A: Test Left Arm Motor Drift - 0 5B: Test Right Arm Motor Drift - 0 6A: Test Left Leg Motor Drift - 0 6B: Test Right Leg Motor Drift - 0 7: Test Limb Ataxia - 0 8: Test Sensation - 0 9: Test Language/Aphasia- 2 10: Test Dysarthria - 0 11: Test Extinction/Inattention - 0    Past Medical History:  Diagnosis Date   Arthritis    hands, fingers, toes (05/11/2014)   Bradycardia    CAD (coronary artery disease)    a. cath 07/01/13: LM no obs dz, LAD widely patent no obs dz, LCx normal in appearance, RCA  small, nondominant vessel. Catheter-induced spasm at the  ostium, EF 50-55%   Cancer (HCC)    Cardiomyopathy (HCC)    a. Echo 10/15: Inferior and inferolateral AK,  Anterolateral HK, EF 40%;  b.  Echo (1/16):  EF 55-60%, Gr 1 DD, PASP 27, mild to mod TR, trivial AI, mild MR   Chronic kidney disease (CKD), stage III (moderate) (HCC)    Cystitis 03/29/1949   tube to bladder was in a kink & I had polyps; surgically corrected   DVT (deep venous thrombosis) (HCC)    Heart murmur    slight   Heart palpitations    a. paroxysmal SVT, PVCs, NSVT. b. echo 03/27/14: EF 50-55%, nl systolic function, AK of basalinferior myocardium, possible HK of the apicallateral and inferolateral myocardium, Ao trivial regurg, mild MR, mild LAE, trivial pulmonic regurg   Hypertension    Hypothyroidism    Osteopenia 09/27/2015   T score -2.4 FRAX 17%/6.5%   Pacemaker    a.  Medtronic MRI compatible ADVISA pulse generator serial number [id number].   Thyroid  nodule    Wide-complex tachycardia 03/25/2014     Family History  Problem Relation Age of Onset   Multiple sclerosis Mother    Heart attack Father    Cancer Sister        Skin cancer   Diabetes Sister    Heart disease Brother  Cancer Brother        Prostate   Leukemia Maternal Grandmother    Nephritis Paternal Grandmother      Social History:  reports that she has never smoked. She has never used smokeless tobacco. She reports that she does not drink alcohol and does not use drugs.   Prior to Admission medications   Medication Sig Start Date End Date Taking? Authorizing Provider  acetaminophen  (TYLENOL ) 500 MG tablet Take 1,000 mg by mouth every 6 (six) hours as needed for moderate pain (pain score 4-6).    [provider]  aspirin  EC 81 MG tablet Take 1 tablet (81 mg total) by mouth daily. Swallow whole. 12/19/23   Cindie Ole DASEN, MD  calcium -vitamin D  (OSCAL WITH D) 500-5 MG-MCG tablet Take 1 tablet by mouth daily.    [provider]  chlorthalidone (HYGROTON) 25 MG tablet  Take 25 mg by mouth every morning. 11/25/21   [provider]  cyanocobalamin  (VITAMIN B12) 1000 MCG tablet Take 1,000 mcg by mouth daily.    [provider]  levothyroxine  (SYNTHROID , LEVOTHROID) 125 MCG tablet Take 125 mcg by mouth daily before breakfast. 09/04/17   [provider]  loratadine  (CLARITIN ) 10 MG tablet Take 10 mg by mouth daily as needed for allergies.     [provider]  losartan  (COZAAR ) 50 MG tablet Take 50 mg by mouth daily.    [provider]  metoprolol  succinate (TOPROL -XL) 50 MG 24 hr tablet Take 50 mg by mouth daily. 11/24/21   [provider]     Exam: Current vital signs: Wt 58.2 kg   BMI 23.47 kg/m    Physical Exam  Constitutional: Appears well-developed and well-nourished.  Psych: Affect appropriate to situation Eyes: No scleral injection HENT: No OP obstrucion Head: Normocephalic.  Cardiovascular: Normal rate and regular rhythm.  Respiratory: Effort normal and breath sounds normal to anterior ascultation GI: Soft.  No distension. There is no tenderness.  Skin: WDI  Neuro: Mental Status: Patient is awake, alert, she is severely aphasic, but is able to close eyes to command, when I ask her to make a fist she simply holds her hands out palms flat, and when I ask her to stick out her tongue she opens her mouth but does not protrude her tongue. Cranial Nerves: II: She does not clearly blink to threat from the right. Pupils are equal, round, and reactive to light.   III,IV, VI: EOMI without ptosis or diploplia.  V: Facial sensation is symmetric to temperature VII: Facial movement with mild right facial weakness VIII: hearing is intact to voice X: Uvula elevates symmetrically XI: Shoulder shrug is symmetric. XII: tongue is midline without atrophy or fasciculations.  Motor: Tone is normal. Bulk is normal. 5/5 strength was present in all four extremities.  Sensory: She responds to noxious stimulation  bilaterally  cerebellar: No clear ataxia  I have reviewed labs in epic and the pertinent results are: Results for orders placed or performed during the hospital encounter of 02/27/24 (from the past 24 hours)  CBG monitoring, ED     Status: Abnormal   Collection Time: 02/27/24  7:22 PM  Result Value Ref Range   Glucose-Capillary 107 (H) 70 - 99 mg/dL  Protime-INR     Status: None   Collection Time: 02/27/24  7:24 PM  Result Value Ref Range   Prothrombin Time 14.2 11.4 - 15.2 seconds   INR 1.0 0.8 - 1.2  APTT     Status:  None   Collection Time: 02/27/24  7:24 PM  Result Value Ref Range   aPTT 26 24 - 36 seconds  CBC     Status: Abnormal   Collection Time: 02/27/24  7:24 PM  Result Value Ref Range   WBC 5.1 4.0 - 10.5 K/uL   RBC 3.89 3.87 - 5.11 MIL/uL   Hemoglobin 12.2 12.0 - 15.0 g/dL   HCT 63.0 63.9 - 53.9 %   MCV 94.9 80.0 - 100.0 fL   MCH 31.4 26.0 - 34.0 pg   MCHC 33.1 30.0 - 36.0 g/dL   RDW 87.3 88.4 - 84.4 %   Platelets 149 (L) 150 - 400 K/uL   nRBC 0.0 0.0 - 0.2 %  Differential     Status: None   Collection Time: 02/27/24  7:24 PM  Result Value Ref Range   Neutrophils Relative % 55 %   Neutro Abs 2.8 1.7 - 7.7 K/uL   Lymphocytes Relative 25 %   Lymphs Abs 1.3 0.7 - 4.0 K/uL   Monocytes Relative 11 %   Monocytes Absolute 0.5 0.1 - 1.0 K/uL   Eosinophils Relative 8 %   Eosinophils Absolute 0.4 0.0 - 0.5 K/uL   Basophils Relative 1 %   Basophils Absolute 0.1 0.0 - 0.1 K/uL   Immature Granulocytes 0 %   Abs Immature Granulocytes 0.01 0.00 - 0.07 K/uL  Comprehensive metabolic panel     Status: Abnormal   Collection Time: 02/27/24  7:24 PM  Result Value Ref Range   Sodium 137 135 - 145 mmol/L   Potassium 4.1 3.5 - 5.1 mmol/L   Chloride 104 98 - 111 mmol/L   CO2 23 22 - 32 mmol/L   Glucose, Bld 109 (H) 70 - 99 mg/dL   BUN 26 (H) 8 - 23 mg/dL   Creatinine, Ser 8.93 (H) 0.44 - 1.00 mg/dL   Calcium  8.6 (L) 8.9 - 10.3 mg/dL   Total Protein 6.1 (L) 6.5 - 8.1 g/dL    Albumin 3.2 (L) 3.5 - 5.0 g/dL   AST 26 15 - 41 U/L   ALT 12 0 - 44 U/L   Alkaline Phosphatase 68 38 - 126 U/L   Total Bilirubin 0.5 0.0 - 1.2 mg/dL   GFR, Estimated 50 (L) >60 mL/min   Anion gap 10 5 - 15  Ethanol     Status: None   Collection Time: 02/27/24  7:24 PM  Result Value Ref Range   Alcohol, Ethyl (B) <15 <15 mg/dL  I-stat chem 8, ED     Status: Abnormal   Collection Time: 02/27/24  7:26 PM  Result Value Ref Range   Sodium 138 135 - 145 mmol/L   Potassium 4.1 3.5 - 5.1 mmol/L   Chloride 105 98 - 111 mmol/L   BUN 26 (H) 8 - 23 mg/dL   Creatinine, Ser 8.79 (H) 0.44 - 1.00 mg/dL   Glucose, Bld 891 (H) 70 - 99 mg/dL   Calcium , Ion 1.08 (L) 1.15 - 1.40 mmol/L   TCO2 22 22 - 32 mmol/L   Hemoglobin 11.9 (L) 12.0 - 15.0 g/dL   HCT 64.9 (L) 63.9 - 53.9 %  MRSA Next Gen by PCR, Nasal     Status: None   Collection Time: 02/27/24  8:43 PM   Specimen: Nasal Mucosa; Nasal Swab  Result Value Ref Range   MRSA by PCR Next Gen NOT DETECTED NOT DETECTED     I have reviewed the images obtained: CT/CTA-negative  Primary Diagnosis:  Cerebral infarction, unspecified  Secondary Diagnosis: Essential (primary) hypertension and CKD Stage 3 (GFR 30-59) Sick sinus syndrome status post pacemaker CAD  Impression: 89 year old female with acute onset aphasia consistent with an acute ischemic stroke.  She has a history of a stroke with similar presentation, but it recovered.  After discussion of the risk, benefits, and alternatives of IV TNK, the patient's sons agreed with proceeding therefore this was administered.  She will need to be monitored closely, and secondary risk factor modification will need to be undertaken.  Plan: - HgbA1c, fasting lipid panel - MRI of the brain without contrast - Maintain BP less than 180/105 - Frequent neuro checks - Echocardiogram - Prophylactic therapy-number 24 hours - Risk factor modification - Telemetry monitoring - PT consult, OT consult, Speech  consult -Continue home Synthroid  - Stroke team to follow    This patient is critically ill and at significant risk of neurological worsening, death and care requires constant monitoring of vital signs, hemodynamics,respiratory and cardiac monitoring, neurological assessment, discussion with family, other specialists and medical decision making of high complexity. I spent 65 minutes of neurocritical care time  in the care of  this patient. This was time spent independent of any time provided by nurse practitioner or PA.  Aisha Seals, MD [facility]   If 7pm- 7am, please page neurology on call as listed in AMION.

## 2024-02-27 NOTE — ED Triage Notes (Signed)
 PT arrives via EMS for a code stroke. PT is coming in from home after being at home sitting on the porch, when family noticed some facial droop. Upon EMS arrival pt responses were word salad. PT could follow some commands but not all.

## 2024-02-28 ENCOUNTER — Inpatient Hospital Stay (HOSPITAL_COMMUNITY)

## 2024-02-28 DIAGNOSIS — I779 Disorder of arteries and arterioles, unspecified: Secondary | ICD-10-CM

## 2024-02-28 DIAGNOSIS — R29700 NIHSS score 0: Secondary | ICD-10-CM

## 2024-02-28 DIAGNOSIS — E785 Hyperlipidemia, unspecified: Secondary | ICD-10-CM

## 2024-02-28 DIAGNOSIS — I6389 Other cerebral infarction: Secondary | ICD-10-CM | POA: Diagnosis not present

## 2024-02-28 DIAGNOSIS — I6350 Cerebral infarction due to unspecified occlusion or stenosis of unspecified cerebral artery: Secondary | ICD-10-CM | POA: Diagnosis not present

## 2024-02-28 DIAGNOSIS — Z7982 Long term (current) use of aspirin: Secondary | ICD-10-CM

## 2024-02-28 DIAGNOSIS — I739 Peripheral vascular disease, unspecified: Secondary | ICD-10-CM | POA: Diagnosis not present

## 2024-02-28 LAB — ECHOCARDIOGRAM COMPLETE
AR max vel: 2.21 cm2
AV Peak grad: 8.4 mmHg
Ao pk vel: 1.45 m/s
Area-P 1/2: 4.15 cm2
Calc EF: 50.5 %
Height: 62 in
S' Lateral: 4 cm
Single Plane A2C EF: 55.1 %
Single Plane A4C EF: 44.1 %
Weight: 2052.92 [oz_av]

## 2024-02-28 LAB — LIPID PANEL
Cholesterol: 168 mg/dL (ref 0–200)
HDL: 53 mg/dL (ref >40)
LDL Cholesterol: 96 mg/dL (ref 0–99)
Total CHOL/HDL Ratio: 3.2 ratio
Triglycerides: 96 mg/dL (ref <150)
VLDL: 19 mg/dL (ref 0–40)

## 2024-02-28 MED ORDER — MUSCLE RUB 10-15 % EX CREA
TOPICAL_CREAM | CUTANEOUS | Status: DC | PRN
  Filled 2024-02-28: qty 85

## 2024-02-28 MED ORDER — ORAL CARE MOUTH RINSE
15.0000 mL | OROMUCOSAL | Status: DC | PRN
Start: 2024-02-28 — End: 2024-03-01

## 2024-02-28 MED ORDER — ROSUVASTATIN CALCIUM 20 MG PO TABS
20.0000 mg | ORAL_TABLET | Freq: Every day | ORAL | Status: DC
  Administered 2024-02-28 – 2024-03-01 (×3): 20 mg via ORAL
  Filled 2024-02-28 (×3): qty 1

## 2024-02-28 NOTE — Progress Notes (Signed)
 OT Cancellation Note  Patient Details Name: Michelle Winters MRN: [id number] DOB: 1932/04/07   Cancelled Treatment:    Reason Eval/Treat Not Completed: OT screened, no needs identified, will sign off. Per PT, pt is independent in mobility and ADL; has good support at home as well.   [name] FREDERICK, OTR/L [facility] Office: [phone]   [name] 02/28/2024, 3:04 PM

## 2024-02-28 NOTE — Evaluation (Signed)
 Physical Therapy Evaluation and Discharge Patient Details Name: Michelle Winters MRN: [id number] DOB: [birth year] Today's Date: 02/28/2024  History of Present Illness  89 y.o. female presents to [facility] hospital on 02/27/24 with acute onset aphasia. NIHSS=8; TNK given; PMH includes DVT, CAD, PPM, CKD, HTN, hypothyroidism; L frontal CVA '24  Clinical Impression  Patient evaluated by Physical Therapy with no further acute PT needs identified. All education has been completed and the patient has no further questions.  PT is signing off. Thank you for this referral.         If plan is discharge home, recommend the following:     Can travel by private vehicle        Equipment Recommendations None recommended by PT  Recommendations for Other Services       Functional Status Assessment Patient has not had a recent decline in their functional status     Precautions / Restrictions Precautions Precautions: Fall Recall of Precautions/Restrictions: Intact      Mobility  Bed Mobility Overal bed mobility: Modified Independent                  Transfers Overall transfer level: Independent Equipment used: None                    Ambulation/Gait Ambulation/Gait assistance: Independent Gait Distance (Feet): 100 Feet Assistive device: None Gait Pattern/deviations: WFL(Within Functional Limits), Trunk flexed   Gait velocity interpretation: 1.31 - 2.62 ft/sec, indicative of limited community ambulator   General Gait Details: able to perform head turns without imbalance; varies speeds; 180 turn and stop  Acupuncturist Bed    Modified Rankin (Stroke Patients Only) Modified Rankin (Stroke Patients Only) Pre-Morbid Rankin Score: No symptoms Modified Rankin: No symptoms     Balance Overall balance assessment: Independent (rhomberg EC x 10 sec; reach to floor)                                           Pertinent  Vitals/Pain Pain Assessment Pain Assessment: No/denies pain    Home Living Family/patient expects to be discharged to:: Private residence Living Arrangements: Children (son, DIL lives with her) Available Help at Discharge: Family;Available 24 hours/day Type of Home: House Home Access: Ramped entrance       Home Layout: One level Home Equipment: Cane - Research scientist (life sciences) (4 wheels)      Prior Function Prior Level of Function : Independent/Modified Independent             Mobility Comments: enjoys working in yard (uses power chair to access)       Extremity/Trunk Assessment   Upper Extremity Assessment Upper Extremity Assessment: Overall WFL for tasks assessed    Lower Extremity Assessment Lower Extremity Assessment: Overall WFL for tasks assessed    Cervical / Trunk Assessment Cervical / Trunk Assessment: Normal  Communication   Communication Communication: No apparent difficulties    Cognition Arousal: Alert Behavior During Therapy: WFL for tasks assessed/performed   PT - Cognitive impairments: No apparent impairments                         Following commands: Intact       Cueing Cueing Techniques: Verbal cues     General  Comments      Exercises     Assessment/Plan    PT Assessment Patient does not need any further PT services  PT Problem List         PT Treatment Interventions      PT Goals (Current goals can be found in the Care Plan section)  Acute Rehab PT Goals PT Goal Formulation: All assessment and education complete, DC therapy    Frequency       Co-evaluation               AM-PAC PT 6 Clicks Mobility  Outcome Measure Help needed turning from your back to your side while in a flat bed without using bedrails?: None Help needed moving from lying on your back to sitting on the side of a flat bed without using bedrails?: None Help needed moving to and from a bed to a chair (including a  wheelchair)?: None Help needed standing up from a chair using your arms (e.g., wheelchair or bedside chair)?: None Help needed to walk in hospital room?: None Help needed climbing 3-5 steps with a railing? : None 6 Click Score: 24    End of Session Equipment Utilized During Treatment: Gait belt Activity Tolerance: Patient tolerated treatment well Patient left: in chair;with call bell/phone within reach Nurse Communication: Mobility status;Other (comment) (no PT needs) PT Visit Diagnosis: Other symptoms and signs involving the nervous system (R29.898)    Time: 8577-8554 PT Time Calculation (min) (ACUTE ONLY): 23 min   Charges:   PT Evaluation $PT Eval Low Complexity: 1 Low   PT General Charges $$ ACUTE PT VISIT: 1 Visit          Macario RAMAN, PT Acute Rehabilitation Services  Office [phone]   Macario SHAUNNA Soja 02/28/2024, 3:06 PM

## 2024-02-28 NOTE — Plan of Care (Signed)
  Problem: Education: Goal: Knowledge of disease or condition will improve Outcome: Progressing   Problem: Self-Care: Goal: Ability to participate in self-care as condition permits will improve Outcome: Progressing   Problem: Nutrition: Goal: Risk of aspiration will decrease Outcome: Progressing

## 2024-02-28 NOTE — Progress Notes (Signed)
 Echocardiogram 2D Echocardiogram has been performed.  Michelle Winters 02/28/2024, 3:43 PM

## 2024-02-28 NOTE — Progress Notes (Signed)
 PT Cancellation Note  Patient Details Name: Michelle Winters MRN: [id number] DOB: Nov 26, 1931   Cancelled Treatment:    Reason Eval/Treat Not Completed: Active bedrest order  Noted bedrest order with TNK given 1942. PT evaluation deferred at this time. If bedrest order is lifted, please reach out via securechat. Otherwise will plan to evaluate 8/3.    Macario RAMAN, PT Acute Rehabilitation Services  Office [phone]  Macario SHAUNNA Soja 02/28/2024, 7:51 AM

## 2024-02-28 NOTE — Progress Notes (Addendum)
 STROKE TEAM PROGRESS NOTE    SIGNIFICANT HOSPITAL EVENTS 8/1: Patient admitted and given TNK  INTERIM HISTORY/SUBJECTIVE  Patient has remained hemodynamically stable and afebrile overnight.  Her aphasia has resolved and she is able to speak and communicate normally.  She will need interrogation of pacemaker and MRI.  OBJECTIVE  CBC    Component Value Date/Time   WBC 5.1 02/27/2024 1924   RBC 3.89 02/27/2024 1924   HGB 11.9 (L) 02/27/2024 1926   HGB 11.4 11/19/2023 1453   HCT 35.0 (L) 02/27/2024 1926   HCT 34.0 11/19/2023 1453   PLT 149 (L) 02/27/2024 1924   PLT 166 11/19/2023 1453   MCV 94.9 02/27/2024 1924   MCV 92 11/19/2023 1453   MCH 31.4 02/27/2024 1924   MCHC 33.1 02/27/2024 1924   RDW 12.6 02/27/2024 1924   RDW 12.6 11/19/2023 1453   LYMPHSABS 1.3 02/27/2024 1924   LYMPHSABS 1.2 11/19/2023 1453   MONOABS 0.5 02/27/2024 1924   EOSABS 0.4 02/27/2024 1924   EOSABS 0.2 11/19/2023 1453   BASOSABS 0.1 02/27/2024 1924   BASOSABS 0.1 11/19/2023 1453    BMET    Component Value Date/Time   NA 138 02/27/2024 1926   NA 140 11/19/2023 1453   K 4.1 02/27/2024 1926   CL 105 02/27/2024 1926   CO2 23 02/27/2024 1924   GLUCOSE 108 (H) 02/27/2024 1926   BUN 26 (H) 02/27/2024 1926   BUN 25 11/19/2023 1453   CREATININE 1.20 (H) 02/27/2024 1926   CALCIUM  8.6 (L) 02/27/2024 1924   EGFR 38 (L) 11/19/2023 1453   GFRNONAA 50 (L) 02/27/2024 1924    IMAGING past 24 hours CT ANGIO HEAD NECK W WO CM (CODE STROKE) Result Date: 02/27/2024 EXAM: CTA HEAD AND NECK WITHOUT AND WITH 02/27/2024 07:41:00 PM TECHNIQUE: CTA of the head and neck was performed without and with the administration of intravenous contrast. Multiplanar 2D and/or 3D reformatted images are provided for review. Automated exposure control, iterative reconstruction, and/or weight based adjustment of the mA/kV was utilized to reduce the radiation dose to as low as reasonably achievable. Stenosis of the internal carotid  arteries measured using NASCET criteria. COMPARISON: None available CLINICAL HISTORY: Neuro deficit, acute, stroke suspected. FINDINGS: CTA NECK: AORTIC ARCH AND ARCH VESSELS: Calcific aortic atherosclerosis. No dissection or arterial injury. No significant stenosis of the brachiocephalic or subclavian arteries. CERVICAL CAROTID ARTERIES: Mild atherosclerotic calcification at the right carotid bifurcation without hemodynamically significant stenosis by NASCET criteria. Minimal left carotid bifurcation atherosclerosis. No dissection or arterial injury. CERVICAL VERTEBRAL ARTERIES: Left dominant vertebral system. Multifocal left V2 segment atherosclerosis without hemodynamically significant stenosis. No dissection or arterial injury. LUNGS AND MEDIASTINUM: Unremarkable. SOFT TISSUES: No acute abnormality. BONES: No acute abnormality. CTA HEAD: ANTERIOR CIRCULATION: No significant stenosis of the internal carotid arteries. No significant stenosis of the anterior cerebral arteries. No significant stenosis of the middle cerebral arteries. No aneurysm. POSTERIOR CIRCULATION: No significant stenosis of the posterior cerebral arteries. No significant stenosis of the basilar artery. No significant stenosis of the vertebral arteries. No aneurysm. OTHER: No dural venous sinus thrombosis on this non-dedicated study. IMPRESSION: 1. No large vessel occlusion, hemodynamically significant stenosis, or aneurysm in the head or neck. 2. Mild atherosclerotic calcification at the right carotid bifurcation without hemodynamically significant stenosis. 3. Left dominant vertebral system with multifocal left V2 segment atherosclerosis without hemodynamically significant stenosis. Electronically signed by: Franky Stanford MD 02/27/2024 07:59 PM EDT RP Workstation: [id number]   CT HEAD CODE STROKE WO CONTRAST Result  Date: 02/27/2024 EXAM: CT HEAD WITHOUT CONTRAST 02/27/2024 07:28:51 PM TECHNIQUE: CT of the head was performed without the  administration of intravenous contrast. Automated exposure control, iterative reconstruction, and/or weight based adjustment of the mA/kV was utilized to reduce the radiation dose to as low as reasonably achievable. COMPARISON: 12/16/2022 CLINICAL HISTORY: Neuro deficit, acute, stroke suspected. FINDINGS: BRAIN AND VENTRICLES: Hypoattenuating foci in the cerebral white matter, most likely representing chronic small vessel disease. Old infarct of the left frontal operculum. Gray-white differentiation is preserved. No hydrocephalus. No extra-axial collection. No mass effect or midline shift. ASPECTS is 10. ORBITS: No acute abnormality. SINUSES: No acute abnormality. SOFT TISSUES AND SKULL: No acute soft tissue abnormality. No skull fracture. IMPRESSION: 1. No acute intracranial abnormality. 2. Hypoattenuating foci in the cerebral white matter, most likely representing chronic small vessel disease. 3. Old infarct of the left frontal operculum. 4. ASPECTS is 10. 5. Findings communicated to Dr. Aisha Seals at 7:35 pm. Electronically signed by: Franky Stanford MD 02/27/2024 07:36 PM EDT RP Workstation: [id number]    Vitals:   02/28/24 1000 02/28/24 1100 02/28/24 1200 02/28/24 1300  BP: 123/60 (!) 156/62 (!) 170/66 (!) 164/64  Pulse: (!) 57 60 72 61  Resp: 20 18 18 20   Temp:   97.9 F (36.6 C)   TempSrc:   Oral   SpO2: 90% 100% 100% 96%  Weight:      Height:         PHYSICAL EXAM General:  Alert, well-nourished, well-developed patient in no acute distress Psych:  Mood and affect appropriate for situation CV: Regular rate and rhythm on monitor Respiratory:  Regular, unlabored respirations on room air   NEURO:  Mental Status: AA&Ox3, patient is able to give clear and coherent history Speech/Language: speech is without dysarthria or aphasia.  Naming, repetition, fluency, and comprehension intact.  Cranial Nerves:  II: PERRL. Visual fields full.  III, IV, VI: EOMI. Eyelids elevate  symmetrically.  V: Sensation is intact to light touch and symmetrical to face.  VII: Face is symmetrical resting and smiling VIII: hearing intact to voice. IX, X: Phonation is normal.  KP:Dynloizm shrug 5/5. XII: tongue is midline without fasciculations. Motor: 5/5 strength to all muscle groups tested.  Tone: is normal and bulk is normal Sensation- Intact to light touch bilaterally. Extinction absent to light touch to DSS.   Coordination: FTN intact bilaterally Gait- deferred  Most Recent NIH 0  ASSESSMENT/PLAN  Ms. MIKENNA BUNKLEY is a 89 y.o. female with history of sick sinus syndrome status post pacemaker, stroke in 2024 and hypertension admitted for acute onset aphasia.  She was given TNK to treat potential stroke.  Neurological exam has improved since, with no evidence of aphasia.  MRI is pending.  Will need to interrogate pacemaker to determine if atrial fibrillation is present.  NIH on Admission 8  Stroke: Left brain infarct s/p TNK, etiology: Large/small vessel disease Code Stroke CT head No acute abnormality.  Old left frontal infarct CTA head & neck no LVO or hemodynamically significant stenosis.  Left V2 atherosclerosis 24-hour CT pending MRI pending on Monday Pacemaker interrogation pending 2D Echo EF 45 to 50% Bilateral lower extremity ultrasound pending LDL 96 HgbA1c 5.5 VTE prophylaxis -SCDs aspirin  81 mg daily prior to admission, now on No antithrombotic within 24 hours from TNK administration Therapy recommendations: None Disposition: Pending  Hx of Stroke/TIA 11/2022 admitted for left frontal infarct.  CTA negative, CT head and neck negative.  CTP 0/8 cc.  LDL 122,  A1c 5.5.  Pacer interrogation no A-fib.  Discharged on DAPT.  Patient declined Leqvio.  Hypertension Home meds: Metoprolol  XL 50 mg daily, losartan  50 mg daily Stable Blood Pressure Goal: BP less than 180/105  Long-term BP goal normotensive  Hyperlipidemia Home meds: None LDL 96, goal <  70 Add rosuvastatin  20 mg daily Continue statin at discharge  Other Stroke Risk Factors Advanced age CHF CAD Left IJ DVT in 2023  Other Active Problems Hypothyroidism-continue home Synthroid  SSS status post pacemaker  Hospital day # 1  Patient seen by NP with MD, MD to edit note as needed. Cortney E Everitt Clint Kill , MSN, AGACNP-BC [facility] See Amion for schedule and pager information 02/28/2024 3:41 PM  ATTENDING NOTE: I reviewed above note and agree with the assessment and plan. Pt was seen and examined.   Son is at bedside.  Patient lying in bed, stated that her speech has improved and back to normal.  On exam, AO x 3, no aphasia, fluent language, follows simple commands, able to name and repeat.  No focal deficit on exam.  MRI pending on Monday due to pacemaker, pacemaker interrogation pending.  LE venous Doppler pending.  Patient agree with Crestor  at this time.  PT and OT no recommendation.  Will consider DAPT once CT showing no bleeding in the brain.  For detailed assessment and plan, please refer to above as I have made changes wherever appropriate.   Ary Cummins, MD PhD Stroke Neurology 02/28/2024 7:57 PM  This patient is critically ill due to stroke status post TNK and at significant risk of neurological worsening, death form recurrent stroke, hemorrhagic transmission, bleeding from TNK. This patient's care requires constant monitoring of vital signs, hemodynamics, respiratory and cardiac monitoring, review of multiple databases, neurological assessment, discussion with family, other specialists and medical decision making of high complexity. I spent 35 minutes of neurocritical care time in the care of this patient. I had long discussion with patient and son at bedside, updated pt current condition, treatment plan and potential prognosis, and answered all the questions.  They expressed understanding and appreciation.        To contact Stroke Continuity  provider, please refer to [email or web address]. After hours, contact General Neurology

## 2024-02-29 ENCOUNTER — Inpatient Hospital Stay (HOSPITAL_COMMUNITY)

## 2024-02-29 DIAGNOSIS — I639 Cerebral infarction, unspecified: Secondary | ICD-10-CM

## 2024-02-29 DIAGNOSIS — I739 Peripheral vascular disease, unspecified: Secondary | ICD-10-CM | POA: Diagnosis not present

## 2024-02-29 DIAGNOSIS — R29700 NIHSS score 0: Secondary | ICD-10-CM | POA: Diagnosis not present

## 2024-02-29 DIAGNOSIS — I779 Disorder of arteries and arterioles, unspecified: Secondary | ICD-10-CM | POA: Diagnosis not present

## 2024-02-29 DIAGNOSIS — I6350 Cerebral infarction due to unspecified occlusion or stenosis of unspecified cerebral artery: Secondary | ICD-10-CM | POA: Diagnosis not present

## 2024-02-29 LAB — CBC
HCT: 32.8 % — ABNORMAL LOW (ref 36.0–46.0)
Hemoglobin: 11.2 g/dL — ABNORMAL LOW (ref 12.0–15.0)
MCH: 31.2 pg (ref 26.0–34.0)
MCHC: 34.1 g/dL (ref 30.0–36.0)
MCV: 91.4 fL (ref 80.0–100.0)
Platelets: 130 K/uL — ABNORMAL LOW (ref 150–400)
RBC: 3.59 MIL/uL — ABNORMAL LOW (ref 3.87–5.11)
RDW: 12.5 % (ref 11.5–15.5)
WBC: 5.8 K/uL (ref 4.0–10.5)
nRBC: 0 % (ref 0.0–0.2)

## 2024-02-29 LAB — BASIC METABOLIC PANEL WITH GFR
Anion gap: 9 (ref 5–15)
BUN: 17 mg/dL (ref 8–23)
CO2: 24 mmol/L (ref 22–32)
Calcium: 8.3 mg/dL — ABNORMAL LOW (ref 8.9–10.3)
Chloride: 103 mmol/L (ref 98–111)
Creatinine, Ser: 1.01 mg/dL — ABNORMAL HIGH (ref 0.44–1.00)
GFR, Estimated: 53 mL/min — ABNORMAL LOW (ref >60)
Glucose, Bld: 113 mg/dL — ABNORMAL HIGH (ref 70–99)
Potassium: 3.8 mmol/L (ref 3.5–5.1)
Sodium: 136 mmol/L (ref 135–145)

## 2024-02-29 MED ORDER — CLOPIDOGREL BISULFATE 75 MG PO TABS
75.0000 mg | ORAL_TABLET | Freq: Every day | ORAL | Status: DC
  Administered 2024-02-29 – 2024-03-01 (×2): 75 mg via ORAL
  Filled 2024-02-29 (×2): qty 1

## 2024-02-29 MED ORDER — ASPIRIN 81 MG PO TBEC
81.0000 mg | DELAYED_RELEASE_TABLET | Freq: Every day | ORAL | Status: DC
  Administered 2024-02-29 – 2024-03-01 (×2): 81 mg via ORAL
  Filled 2024-02-29 (×2): qty 1

## 2024-02-29 NOTE — Discharge Summary (Shared)
 Stroke Discharge Summary  Patient ID: Michelle Winters   MRN: [id number]      DOB: Feb 16, 1932  Date of Admission: 02/27/2024 Date of Discharge: 03/01/2024  Attending Physician:  Stroke, Md, MD Consultant(s):    None  Patient's PCP:  Michelle Millman, PA  DISCHARGE PRIMARY DIAGNOSIS:    Left brain stroke aborted by TNK vs TIA  Secondary diagnosis History of stroke Hypertension Hyperlipidemia CHF CAD Left IJ DVT in 2023 SSS post pacemaker Hypothyroidism    Allergies as of 03/01/2024       Reactions   Lisinopril Other (See Comments)   Chest tightness   Simvastatin Other (See Comments)   Muscle pain   Welchol [colesevelam Hcl] Other (See Comments)   Muscle pain   Amiodarone  Other (See Comments)   Lowered heart rate   Ciprofloxacin Nausea Only   Zetia [ezetimibe] Nausea Only        Medication List     TAKE these medications    aspirin  EC 81 MG tablet Take 1 tablet (81 mg total) by mouth daily. Swallow whole. Start taking on: March 02, 2024   calcium -vitamin D  500-5 MG-MCG tablet Commonly known as: OSCAL WITH D Take 1 tablet by mouth daily.   chlorthalidone 25 MG tablet Commonly known as: HYGROTON Take 25 mg by mouth every morning.   clopidogrel  75 MG tablet Commonly known as: PLAVIX  Take 1 tablet (75 mg total) by mouth daily. Start taking on: March 02, 2024   cyanocobalamin  1000 MCG tablet Commonly known as: VITAMIN B12 Take 1,000 mcg by mouth daily.   levothyroxine  125 MCG tablet Commonly known as: SYNTHROID  Take 125 mcg by mouth daily before breakfast.   loratadine  10 MG tablet Commonly known as: CLARITIN  Take 10 mg by mouth daily as needed for allergies.   losartan  50 MG tablet Commonly known as: COZAAR  Take 50 mg by mouth daily.   metoprolol  succinate 50 MG 24 hr tablet Commonly known as: TOPROL -XL Take 50 mg by mouth daily.   rosuvastatin  20 MG tablet Commonly known as: CRESTOR  Take 1 tablet (20 mg total) by mouth daily. Start  taking on: March 02, 2024   TYLENOL  500 MG tablet Generic drug: acetaminophen  Take 1,000 mg by mouth every 6 (six) hours as needed for moderate pain (pain score 4-6).        LABORATORY STUDIES CBC    Component Value Date/Time   WBC 7.2 03/01/2024 0445   RBC 3.44 (L) 03/01/2024 0445   HGB 10.8 (L) 03/01/2024 0445   HGB 11.4 11/19/2023 1453   HCT 31.8 (L) 03/01/2024 0445   HCT 34.0 11/19/2023 1453   PLT 133 (L) 03/01/2024 0445   PLT 166 11/19/2023 1453   MCV 92.4 03/01/2024 0445   MCV 92 11/19/2023 1453   MCH 31.4 03/01/2024 0445   MCHC 34.0 03/01/2024 0445   RDW 12.5 03/01/2024 0445   RDW 12.6 11/19/2023 1453   LYMPHSABS 1.3 02/27/2024 1924   LYMPHSABS 1.2 11/19/2023 1453   MONOABS 0.5 02/27/2024 1924   EOSABS 0.4 02/27/2024 1924   EOSABS 0.2 11/19/2023 1453   BASOSABS 0.1 02/27/2024 1924   BASOSABS 0.1 11/19/2023 1453   CMP    Component Value Date/Time   NA 134 (L) 03/01/2024 0445   NA 140 11/19/2023 1453   K 4.0 03/01/2024 0445   CL 102 03/01/2024 0445   CO2 23 03/01/2024 0445   GLUCOSE 126 (H) 03/01/2024 0445   BUN 24 (H) 03/01/2024 0445  BUN 25 11/19/2023 1453   CREATININE 1.53 (H) 03/01/2024 0445   CALCIUM  8.0 (L) 03/01/2024 0445   PROT 6.1 (L) 02/27/2024 1924   ALBUMIN 3.2 (L) 02/27/2024 1924   AST 26 02/27/2024 1924   ALT 12 02/27/2024 1924   ALKPHOS 68 02/27/2024 1924   BILITOT 0.5 02/27/2024 1924   GFRNONAA 32 (L) 03/01/2024 0445   GFRAA 52 (L) 04/01/2019 1035   COAGS Lab Results  Component Value Date   INR 1.0 02/27/2024   INR 1.0 12/16/2022   INR 1.0 04/22/2022   Lipid Panel    Component Value Date/Time   CHOL 168 02/28/2024 0809   TRIG 96 02/28/2024 0809   HDL 53 02/28/2024 0809   CHOLHDL 3.2 02/28/2024 0809   VLDL 19 02/28/2024 0809   LDLCALC 96 02/28/2024 0809   HgbA1C  Lab Results  Component Value Date   HGBA1C 5.7 (H) 02/27/2024   Alcohol Level    Component Value Date/Time   [facility] <15 02/27/2024 1924     SIGNIFICANT  DIAGNOSTIC STUDIES VAS US  LOWER EXTREMITY VENOUS (DVT) Result Date: 02/29/2024  Lower Venous DVT Study Patient Name:  Michelle Winters  Date of Exam:   02/29/2024 Medical Rec #: [id number]         Accession #:    [id number] Date of Birth: [birth year]          Patient Gender: F Patient Age:   89 years Exam Location:  [facility] Procedure:      VAS US  LOWER EXTREMITY VENOUS (DVT) Referring Phys: ARY XU --------------------------------------------------------------------------------  Indications: Stroke.  Risk Factors: None identified. Comparison Study: No prior studies. Performing Technologist: Cordella Collet RVT  Examination Guidelines: A complete evaluation includes B-mode imaging, spectral Doppler, color Doppler, and power Doppler as needed of all accessible portions of each vessel. Bilateral testing is considered an integral part of a complete examination. Limited examinations for reoccurring indications may be performed as noted. The reflux portion of the exam is performed with the patient in reverse Trendelenburg.  +---------+---------------+---------+-----------+----------+--------------+ RIGHT    CompressibilityPhasicitySpontaneityPropertiesThrombus Aging +---------+---------------+---------+-----------+----------+--------------+ CFV      Full           Yes      Yes                                 +---------+---------------+---------+-----------+----------+--------------+ SFJ      Full                                                        +---------+---------------+---------+-----------+----------+--------------+ FV Prox  Full                                                        +---------+---------------+---------+-----------+----------+--------------+ FV Mid   Full                                                        +---------+---------------+---------+-----------+----------+--------------+ FV DistalFull                                                         +---------+---------------+---------+-----------+----------+--------------+  PFV      Full                                                        +---------+---------------+---------+-----------+----------+--------------+ POP      Full           Yes      Yes                                 +---------+---------------+---------+-----------+----------+--------------+ PTV      Full                                                        +---------+---------------+---------+-----------+----------+--------------+ PERO     Full                                                        +---------+---------------+---------+-----------+----------+--------------+   +---------+---------------+---------+-----------+----------+--------------+ LEFT     CompressibilityPhasicitySpontaneityPropertiesThrombus Aging +---------+---------------+---------+-----------+----------+--------------+ CFV      Full           Yes      Yes                                 +---------+---------------+---------+-----------+----------+--------------+ SFJ      Full                                                        +---------+---------------+---------+-----------+----------+--------------+ FV Prox  Full                                                        +---------+---------------+---------+-----------+----------+--------------+ FV Mid   Full                                                        +---------+---------------+---------+-----------+----------+--------------+ FV DistalFull                                                        +---------+---------------+---------+-----------+----------+--------------+ PFV      Full                                                        +---------+---------------+---------+-----------+----------+--------------+  POP      Full           Yes      Yes                                  +---------+---------------+---------+-----------+----------+--------------+ PTV      Full                                                        +---------+---------------+---------+-----------+----------+--------------+ PERO     Full                                                        +---------+---------------+---------+-----------+----------+--------------+     Summary: RIGHT: - There is no evidence of deep vein thrombosis in the lower extremity.  - No cystic structure found in the popliteal fossa.  LEFT: - There is no evidence of deep vein thrombosis in the lower extremity.  - No cystic structure found in the popliteal fossa.  *See table(s) above for measurements and observations. Electronically signed by Debby Robertson on 02/29/2024 at 3:25:39 PM.    Final    CT HEAD WO CONTRAST ( ) Result Date: 02/28/2024 CLINICAL DATA:  Provided history: Stroke, follow-up. Additional history provided: Post TNK stroke follow-up. EXAM: CT HEAD WITHOUT CONTRAST TECHNIQUE: Contiguous axial images were obtained from the base of the skull through the vertex without intravenous contrast. RADIATION DOSE REDUCTION: This exam was performed according to the departmental dose-optimization program which includes automated exposure control, adjustment of the mA and/or kV according to patient size and/or use of iterative reconstruction technique. COMPARISON:  Non-contrast head CT and CT angiogram head/neck 02/27/2024. FINDINGS: Brain: Generalized cerebral atrophy. Known chronic cortical/subcortical infarct within the mid left frontal lobe/frontal operculum (MCA vascular territory). Background mild patchy and ill-defined hypoattenuation within the cerebral white matter, nonspecific but compatible with chronic small vessel ischemic disease. There is no acute intracranial hemorrhage. No acute demarcated cortical infarct. No extra-axial fluid collection. No evidence of an intracranial mass. No midline shift. Vascular: No  hyperdense vessel.  Atherosclerotic calcifications. Skull: No calvarial fracture or aggressive osseous lesion. Sinuses/Orbits: No mass or acute finding within the imaged orbits. No significant paranasal sinus disease at the imaged levels. IMPRESSION: 1. No evidence of an acute intracranial abnormality. 2. Known chronic left MCA territory infarct within the mid frontal lobe/frontal operculum. 3. Background parenchymal atrophy and chronic small vessel ischemic disease. Electronically Signed   By: Rockey Childs D.O.   On: 02/28/2024 17:36   ECHOCARDIOGRAM COMPLETE Result Date: 02/28/2024    ECHOCARDIOGRAM REPORT   Patient Name:   ANNAYAH WORTHLEY Date of Exam: 02/28/2024 Medical Rec #:  [id number]        Height:       62.0 in Accession #:    [id number]       Weight:       128.3 lb Date of Birth:  [birth year]         BSA:          1.583 m Patient Age:    89 years  BP:           170/66 mmHg Patient Gender: F                HR:           68 bpm. Exam Location:  Inpatient Procedure: 2D Echo, Cardiac Doppler and Color Doppler (Both Spectral and Color            Flow Doppler were utilized during procedure). Indications:    Stroke I63.9  History:        Patient has prior history of Echocardiogram examinations, most                 recent 12/18/2022. Cardiomyopathy, Stroke,                 Arrythmias:Tachycardia, Bradycardia and PVC,                 Signs/Symptoms:Syncope; Risk Factors:Hypertension.  Sonographer:    Thea Norlander RCS Referring Phys: MCNEILL P KIRKPATRICK IMPRESSIONS  1. Left ventricular ejection fraction, by estimation, is 45 to 50%. The left ventricle has mildly decreased function. Left ventricular endocardial border not optimally defined to evaluate regional wall motion but there appears to be lateral wall hypokineiss and basal to mid inferolateral akinesis. Left ventricular diastolic parameters are consistent with Grade I diastolic dysfunction (impaired relaxation).  2. Right ventricular systolic  function is normal. The right ventricular size is normal. Tricuspid regurgitation signal is inadequate for assessing PA pressure.  3. The mitral valve is normal in structure. Mild mitral valve regurgitation. No evidence of mitral stenosis.  4. The aortic valve is tricuspid. Aortic valve regurgitation is not visualized. No aortic stenosis is present.  5. The inferior vena cava is normal in size with greater than 50% respiratory variability, suggesting right atrial pressure of 3 mmHg. FINDINGS  Left Ventricle: Left ventricular ejection fraction, by estimation, is 45 to 50%. The left ventricle has mildly decreased function. Left ventricular endocardial border not optimally defined to evaluate regional wall motion. Strain was performed and the global longitudinal strain is indeterminate. The left ventricular internal cavity size was normal in size. There is no left ventricular hypertrophy. Left ventricular diastolic parameters are consistent with Grade I diastolic dysfunction (impaired relaxation). Right Ventricle: The right ventricular size is normal. No increase in right ventricular wall thickness. Right ventricular systolic function is normal. Tricuspid regurgitation signal is inadequate for assessing PA pressure. Left Atrium: Left atrial size was normal in size. Right Atrium: Right atrial size was normal in size. Pericardium: There is no evidence of pericardial effusion. Mitral Valve: The mitral valve is normal in structure. Mild mitral valve regurgitation. No evidence of mitral valve stenosis. Tricuspid Valve: The tricuspid valve is normal in structure. Tricuspid valve regurgitation is mild . No evidence of tricuspid stenosis. Aortic Valve: The aortic valve is tricuspid. Aortic valve regurgitation is not visualized. No aortic stenosis is present. Aortic valve peak gradient measures 8.4 mmHg. Pulmonic Valve: The pulmonic valve was not well visualized. Pulmonic valve regurgitation is mild. No evidence of pulmonic  stenosis. Aorta: The aortic root and ascending aorta are structurally normal, with no evidence of dilitation. Venous: The inferior vena cava is normal in size with greater than 50% respiratory variability, suggesting right atrial pressure of 3 mmHg. IAS/Shunts: No atrial level shunt detected by color flow Doppler. Additional Comments: 3D was performed not requiring image post processing on an independent workstation and was indeterminate.  LEFT VENTRICLE PLAX 2D LVIDd:  5.10 cm      Diastology LVIDs:         4.00 cm      LV e' medial:    6.53 cm/s LV PW:         0.90 cm      LV E/e' medial:  12.2 LV IVS:        1.00 cm      LV e' lateral:   9.57 cm/s LVOT diam:     1.90 cm      LV E/e' lateral: 8.3 LV SV:         67 LV SV Index:   42 LVOT Area:     2.84 cm  LV Volumes (MOD) LV vol d, MOD A2C: 108.0 ml LV vol d, MOD A4C: 84.8 ml LV vol s, MOD A2C: 48.5 ml LV vol s, MOD A4C: 47.4 ml LV SV MOD A2C:     59.5 ml LV SV MOD A4C:     84.8 ml LV SV MOD BP:      50.8 ml RIGHT VENTRICLE             IVC RV S prime:     14.00 cm/s  IVC diam: 1.90 cm TAPSE (M-mode): 2.2 cm LEFT ATRIUM             Index        RIGHT ATRIUM           Index LA diam:        3.50 cm 2.21 cm/m   RA Area:     16.30 cm LA Vol (A2C):   46.9 ml 29.63 ml/m  RA Volume:   41.10 ml  25.96 ml/m LA Vol (A4C):   38.4 ml 24.26 ml/m LA Biplane Vol: 43.5 ml 27.48 ml/m  AORTIC VALVE AV Area (Vmax): 2.21 cm AV Vmax:        145.00 cm/s AV Peak Grad:   8.4 mmHg LVOT Vmax:      113.00 cm/s LVOT Vmean:     70.200 cm/s LVOT VTI:       0.236 m  AORTA Ao Root diam: 3.40 cm Ao Asc diam:  3.30 cm MITRAL VALVE MV Area (PHT): 4.15 cm     SHUNTS MV Decel Time: 183 msec     Systemic VTI:  0.24 m MV E velocity: 79.70 cm/s   Systemic Diam: 1.90 cm MV A velocity: 100.00 cm/s MV E/A ratio:  0.80 Vishnu Priya Mallipeddi Electronically signed by Diannah Late Mallipeddi Signature Date/Time: 02/28/2024/3:42:32 PM    Final    CT ANGIO HEAD NECK W WO CM (CODE STROKE) Result  Date: 02/27/2024 EXAM: CTA HEAD AND NECK WITHOUT AND WITH 02/27/2024 07:41:00 PM TECHNIQUE: CTA of the head and neck was performed without and with the administration of intravenous contrast. Multiplanar 2D and/or 3D reformatted images are provided for review. Automated exposure control, iterative reconstruction, and/or weight based adjustment of the mA/kV was utilized to reduce the radiation dose to as low as reasonably achievable. Stenosis of the internal carotid arteries measured using NASCET criteria. COMPARISON: None available CLINICAL HISTORY: Neuro deficit, acute, stroke suspected. FINDINGS: CTA NECK: AORTIC ARCH AND ARCH VESSELS: Calcific aortic atherosclerosis. No dissection or arterial injury. No significant stenosis of the brachiocephalic or subclavian arteries. CERVICAL CAROTID ARTERIES: Mild atherosclerotic calcification at the right carotid bifurcation without hemodynamically significant stenosis by NASCET criteria. Minimal left carotid bifurcation atherosclerosis. No dissection or arterial injury. CERVICAL VERTEBRAL ARTERIES: Left dominant vertebral system. Multifocal left V2  segment atherosclerosis without hemodynamically significant stenosis. No dissection or arterial injury. LUNGS AND MEDIASTINUM: Unremarkable. SOFT TISSUES: No acute abnormality. BONES: No acute abnormality. CTA HEAD: ANTERIOR CIRCULATION: No significant stenosis of the internal carotid arteries. No significant stenosis of the anterior cerebral arteries. No significant stenosis of the middle cerebral arteries. No aneurysm. POSTERIOR CIRCULATION: No significant stenosis of the posterior cerebral arteries. No significant stenosis of the basilar artery. No significant stenosis of the vertebral arteries. No aneurysm. OTHER: No dural venous sinus thrombosis on this non-dedicated study. IMPRESSION: 1. No large vessel occlusion, hemodynamically significant stenosis, or aneurysm in the head or neck. 2. Mild atherosclerotic calcification at  the right carotid bifurcation without hemodynamically significant stenosis. 3. Left dominant vertebral system with multifocal left V2 segment atherosclerosis without hemodynamically significant stenosis. Electronically signed by: Franky Stanford MD 02/27/2024 07:59 PM EDT RP Workstation: [id number]   CT HEAD CODE STROKE WO CONTRAST Result Date: 02/27/2024 EXAM: CT HEAD WITHOUT CONTRAST 02/27/2024 07:28:51 PM TECHNIQUE: CT of the head was performed without the administration of intravenous contrast. Automated exposure control, iterative reconstruction, and/or weight based adjustment of the mA/kV was utilized to reduce the radiation dose to as low as reasonably achievable. COMPARISON: 12/16/2022 CLINICAL HISTORY: Neuro deficit, acute, stroke suspected. FINDINGS: BRAIN AND VENTRICLES: Hypoattenuating foci in the cerebral white matter, most likely representing chronic small vessel disease. Old infarct of the left frontal operculum. Gray-white differentiation is preserved. No hydrocephalus. No extra-axial collection. No mass effect or midline shift. ASPECTS is 10. ORBITS: No acute abnormality. SINUSES: No acute abnormality. SOFT TISSUES AND SKULL: No acute soft tissue abnormality. No skull fracture. IMPRESSION: 1. No acute intracranial abnormality. 2. Hypoattenuating foci in the cerebral white matter, most likely representing chronic small vessel disease. 3. Old infarct of the left frontal operculum. 4. ASPECTS is 10. 5. Findings communicated to Dr. Aisha Seals at 7:35 pm. Electronically signed by: Franky Stanford MD 02/27/2024 07:36 PM EDT RP Workstation: [id number]       HISTORY OF PRESENT ILLNESS 89 y.o. patient with history of sick sinus syndrome status post pacemaker, stroke in 2024 and hypertension was admitted with acute onset aphasia.  HOSPITAL COURSE Patient was given TNK to treat stroke, and aphasia resolved.  Pacemaker was interrogated, and no evidence of atrial fibrillation was found.  She is now  stable and ready to be discharged home.  Left brain stroke aborted by TNK vs TIA Code Stroke CT head No acute abnormality.  Old left frontal infarct CTA head & neck no LVO or hemodynamically significant stenosis.  Left V2 atherosclerosis 24-hour CT no acute abnormality, known chronic left MCA territory infarct, background atrophy and chronic small vessel ischemic disease MRI no acute process. Old infarcts in the left frontal lobe and right caudate nucleus.  Pacemaker interrogation no atrial fibrillation seen 2D Echo EF 45 to 50% Bilateral lower extremity ultrasound no evidence of DVT LDL 96 HgbA1c 5.5 aspirin  81 mg daily prior to admission, aspirin  81 mg daily and Plavix  75 mg daily for 3 weeks followed by Plavix  alone Therapy recommendations: None Disposition: Home   Hx of Stroke/TIA 11/2022 admitted for left frontal infarct.  CTA negative, CT head and neck negative.  CTP 0/8 cc.  LDL 122, A1c 5.5.  Pacer interrogation no A-fib.  Discharged on DAPT.  Patient declined Leqvio.   Hypertension Home meds: Metoprolol  XL 50 mg daily, losartan  50 mg daily Stable Long-term BP goal normotensive   Hyperlipidemia Home meds: None LDL 96, goal < 70 Add rosuvastatin  20 mg  daily Continue statin at discharge   Other Stroke Risk Factors Advanced age CHF CAD Left IJ DVT in 2023   Other Active Problems Hypothyroidism-continue home Synthroid  SSS status post pacemaker   DISCHARGE EXAM PHYSICAL EXAM General:  Alert, well-nourished, well-developed patient in no acute distress Psych:  Mood and affect appropriate for situation CV: Regular rate and rhythm on monitor Respiratory:  Regular, unlabored respirations on room air     NEURO:  Mental Status: AA&Ox3, patient is able to give clear and coherent history Speech/Language: speech is without dysarthria or aphasia.  Naming, repetition, fluency, and comprehension intact.   Cranial Nerves:  II: PERRL. Visual fields full.  III, IV, VI: EOMI.  Eyelids elevate symmetrically.  VII: Face is symmetrical resting and smiling VIII: hearing intact to voice. IX, X: Phonation is normal.  XII: tongue is midline without fasciculations. Motor: 5/5 strength to all muscle groups tested.  Tone: is normal and bulk is normal Sensation- Intact to light touch bilaterally. Extinction absent to light touch to DSS.   Coordination: FTN intact bilaterally Gait- deferred   Most Recent NIH 0    Discharge Diet       Diet   Diet regular Room service appropriate? Yes; Fluid consistency: Thin   liquids  DISCHARGE PLAN Disposition: Home aspirin  81 mg daily and clopidogrel  75 mg daily for secondary stroke prevention for 3 weeks then clopidogrel  75 mg daily alone. Ongoing stroke risk factor control by Primary Care Physician at time of discharge Follow-up PCP Cunningham, Alexus, PA in 2 weeks. Follow-up in [facility] in 8 weeks, office to schedule an appointment.   35 minutes were spent preparing discharge.  Karna Geralds DNP, ACNPC-AG  [facility]  ATTENDING NOTE: I reviewed above note and agree with the assessment and plan. Pt was seen and examined.   No family at bedside.  Patient sitting at edge of bed, neuro intact, LE venous Doppler no DVT.  MRI showed no acute infarct.  Continue DAPT and statin.  Medically ready for discharge.  Follow-up at GI.  For detailed assessment and plan, please refer to above as I have made changes wherever appropriate.   Ary Cummins, MD PhD Stroke Neurology 03/01/2024 11:17 PM

## 2024-02-29 NOTE — Evaluation (Signed)
 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   Cardiomyopathy (HCC) 05/27/2014   Wide-complex tachycardia - presumed SVT 05/27/2014   S/P cardiac pacemaker procedure 05/11/14 Medtronic 05/13/2014   Pneumothorax on left 05/13/2014   Symptomatic bradycardia 05/11/2014   NSVT (nonsustained ventricular tachycardia) (HCC) 04/12/2014   SVT (supraventricular tachycardia) (HCC) 03/25/2014   Near syncope 03/19/2014   Palpitations 03/19/2014   Hypothyroidism 03/19/2014   Syncope 12/16/2013   PVC (premature ventricular contraction) 06/17/2013   Abnormal nuclear cardiac imaging test 06/17/2013   Sinus bradycardia 06/17/2013   Hypertension    Past Medical History:  Past Medical History:  Diagnosis Date   Arthritis    hands, fingers, toes (05/11/2014)   Bradycardia    CAD (coronary artery disease)    a. cath 07/01/13: LM no obs dz, LAD widely patent no obs dz, LCx normal in appearance, RCA  small, nondominant vessel. Catheter-induced spasm at the ostium, EF 50-55%   Cancer (HCC)    Cardiomyopathy (HCC)    a. Echo 10/15: Inferior and inferolateral AK,  Anterolateral HK, EF 40%;  b.  Echo (1/16):  EF 55-60%, Gr 1 DD, PASP 27, mild to mod TR, trivial AI, mild MR   Chronic kidney disease (CKD), stage III (moderate) (HCC)    Cystitis 03/29/1949   tube to bladder was in a kink & I had polyps; surgically corrected   DVT (deep venous thrombosis) (HCC)    Heart murmur    slight   Heart palpitations    a. paroxysmal SVT, PVCs, NSVT. b. echo 03/27/14: EF  50-55%, nl systolic function, AK of basalinferior myocardium, possible HK of the apicallateral and inferolateral myocardium, Ao trivial regurg, mild MR, mild LAE, trivial pulmonic regurg   Hypertension    Hypothyroidism    Osteopenia 09/27/2015   T score -2.4 FRAX 17%/6.5%   Pacemaker    a.  Medtronic MRI compatible ADVISA pulse generator serial number [id number].   Thyroid  nodule    Wide-complex tachycardia 03/25/2014   Past Surgical History:  Past Surgical History:  Procedure Laterality Date   basal cell carcinoma removal  2023   from nose   CARDIAC CATHETERIZATION  06/28/2013   normal coronary arteries   CATARACT EXTRACTION W/ INTRAOCULAR LENS  IMPLANT, BILATERAL Bilateral    CHOLECYSTECTOMY     DILATION AND CURETTAGE OF UTERUS     HYSTEROSCOPY  03/29/1949   tube to bladder was in a kink & I had polyps; surgically corrected   LEFT HEART CATHETERIZATION WITH CORONARY ANGIOGRAM [name] 07/01/2013   Procedure: LEFT HEART CATHETERIZATION WITH CORONARY ANGIOGRAM;  Surgeon: Ozell JONETTA Fell, MD;  Location: [facility] CATH LAB;  Service: Cardiovascular;  Laterality: [name];   PACEMAKER INSERTION  05/11/2014   MDT dual chamber Advisa pacemaker implanted by Dr Fernande -- MRI compatible system   PERMANENT PACEMAKER INSERTION [name] 05/11/2014   Procedure: PERMANENT PACEMAKER INSERTION;  Surgeon: Elspeth JAYSON Fernande, MD;  Location: [facility] CATH LAB;  Service: Cardiovascular;  Laterality: [name];   PPM GENERATOR CHANGEOUT [name] 12/15/2023   Procedure: PPM GENERATOR CHANGEOUT;  Surgeon: Cindie Smalls  T, MD;  Location: [facility] INVASIVE CV LAB;  Service: Cardiovascular;  Laterality: [name];   squamous cell carcinoma removal  2023   Thyroid  nodule biopsy     THYROIDECTOMY [name] 01/26/2016   Procedure: TOTAL THYROIDECTOMY;  Surgeon: Krystal Spinner, MD;  Location: [facility] OR;  Service: General;  Laterality: [name];   TONSILLECTOMY     URETER SURGERY Left many years ago? 1960   HPI:  Pt is a 89 y.o. female who presented 02/28/24 with acute onset  aphasia. CT head negative for acute changes; MRI not completed at time of evaluation. PMH: stroke a little over a year PTA.   Assessment / Plan / Recommendation Clinical Impression  Pt participated in speech-language-cognition evaluation. Pt reported that she completed high school and is a retired Print production planner. She stated that her son lives with her, that she is still independent with medication and financial management, and she denied any baseline impairments in speech, language or cognition. [facility] Mental Status Examination was completed to evaluate the pt's cognitive-linguistic skills. She achieved a score of 30/30 which is within the normal limits of 27 or more out of 30. No speech or language deficits were noted and her performance on informal cognitive-linguistic tasks was within functional limits. Further skilled SLP services are not clinically indicated at this time. Pt was educated regarding this and she verbalized understanding as well as agreement with plan of care.    SLP Assessment  SLP Recommendation/Assessment: Patient does not need any further Speech Language Pathology Services SLP Visit Diagnosis: Cognitive communication deficit (R41.841)     Assistance Recommended at Discharge     Functional Status Assessment Patient has not had a recent decline in their functional status  Frequency and Duration           SLP Evaluation Cognition  Overall Cognitive Status: Within Functional Limits for tasks assessed Arousal/Alertness: Awake/alert Orientation Level: Oriented X4 Year: 2025 Month: August Day of Week: Correct Attention: Focused;Sustained Focused Attention: Appears intact Sustained Attention: Appears intact Memory: Appears intact Awareness: Appears intact Problem Solving: Appears intact Executive Function: Reasoning;Sequencing;Organizing Reasoning: Appears intact Sequencing: Appears intact Organizing: Appears intact       Comprehension   Auditory Comprehension Overall Auditory Comprehension: Appears within functional limits for tasks assessed Yes/No Questions: Within Functional Limits Commands: Within Functional Limits Conversation: Complex    Expression Verbal Expression Overall Verbal Expression: Appears within functional limits for tasks assessed Initiation: No impairment Level of Generative/Spontaneous Verbalization: Conversation Repetition: No impairment Naming: No impairment Pragmatics: No impairment   Oral / Motor  Oral Motor/Sensory Function Overall Oral Motor/Sensory Function: Within functional limits Motor Speech Overall Motor Speech: Appears within functional limits for tasks assessed Respiration: Within functional limits Phonation: Normal Resonance: Within functional limits Articulation: Within functional limitis Intelligibility: Intelligible Motor Planning: Within functional limits Motor Speech Errors: Not applicable           Caroll Cunnington I. [place], MS, CCC-SLP Acute Rehabilitation Services Office number [phone]  Thea LILLETTE [place] 02/29/2024, 3:36 PM

## 2024-02-29 NOTE — Progress Notes (Signed)
 Bilateral lower extremity venous duplex has been completed. Preliminary results can be found in CV Proc through chart review.   02/29/24 12:06 PM Cathlyn Collet RVT

## 2024-02-29 NOTE — Progress Notes (Addendum)
 STROKE TEAM PROGRESS NOTE    SIGNIFICANT HOSPITAL EVENTS 8/1: Patient admitted and given TNK 8/3: Patient transferred out of the ICU  INTERIM HISTORY/SUBJECTIVE Patient remains hemodynamically stable and afebrile, and her neurological exam is also stable.  She is ready to be transferred out of the ICU today.  OBJECTIVE  CBC    Component Value Date/Time   WBC 5.8 02/29/2024 0520   RBC 3.59 (L) 02/29/2024 0520   HGB 11.2 (L) 02/29/2024 0520   HGB 11.4 11/19/2023 1453   HCT 32.8 (L) 02/29/2024 0520   HCT 34.0 11/19/2023 1453   PLT 130 (L) 02/29/2024 0520   PLT 166 11/19/2023 1453   MCV 91.4 02/29/2024 0520   MCV 92 11/19/2023 1453   MCH 31.2 02/29/2024 0520   MCHC 34.1 02/29/2024 0520   RDW 12.5 02/29/2024 0520   RDW 12.6 11/19/2023 1453   LYMPHSABS 1.3 02/27/2024 1924   LYMPHSABS 1.2 11/19/2023 1453   MONOABS 0.5 02/27/2024 1924   EOSABS 0.4 02/27/2024 1924   EOSABS 0.2 11/19/2023 1453   BASOSABS 0.1 02/27/2024 1924   BASOSABS 0.1 11/19/2023 1453    BMET    Component Value Date/Time   NA 136 02/29/2024 0520   NA 140 11/19/2023 1453   K 3.8 02/29/2024 0520   CL 103 02/29/2024 0520   CO2 24 02/29/2024 0520   GLUCOSE 113 (H) 02/29/2024 0520   BUN 17 02/29/2024 0520   BUN 25 11/19/2023 1453   CREATININE 1.01 (H) 02/29/2024 0520   CALCIUM  8.3 (L) 02/29/2024 0520   EGFR 38 (L) 11/19/2023 1453   GFRNONAA 53 (L) 02/29/2024 0520    IMAGING past 24 hours VAS US  LOWER EXTREMITY VENOUS (DVT) Result Date: 02/29/2024  Lower Venous DVT Study Patient Name:  Michelle Winters  Date of Exam:   02/29/2024 Medical Rec #: [id number]         Accession #:    [id number] Date of Birth: [birth year]          Patient Gender: F Patient Age:   89 years Exam Location:  [facility] Procedure:      VAS US  LOWER EXTREMITY VENOUS (DVT) Referring Phys: ARY Placida Cambre --------------------------------------------------------------------------------  Indications: Stroke.  Risk Factors: None identified.  Comparison Study: No prior studies. Performing Technologist: Cordella Collet RVT  Examination Guidelines: A complete evaluation includes B-mode imaging, spectral Doppler, color Doppler, and power Doppler as needed of all accessible portions of each vessel. Bilateral testing is considered an integral part of a complete examination. Limited examinations for reoccurring indications may be performed as noted. The reflux portion of the exam is performed with the patient in reverse Trendelenburg.  +---------+---------------+---------+-----------+----------+--------------+ RIGHT    CompressibilityPhasicitySpontaneityPropertiesThrombus Aging +---------+---------------+---------+-----------+----------+--------------+ CFV      Full           Yes      Yes                                 +---------+---------------+---------+-----------+----------+--------------+ SFJ      Full                                                        +---------+---------------+---------+-----------+----------+--------------+ FV Prox  Full                                                        +---------+---------------+---------+-----------+----------+--------------+  FV Mid   Full                                                        +---------+---------------+---------+-----------+----------+--------------+ FV DistalFull                                                        +---------+---------------+---------+-----------+----------+--------------+ PFV      Full                                                        +---------+---------------+---------+-----------+----------+--------------+ POP      Full           Yes      Yes                                 +---------+---------------+---------+-----------+----------+--------------+ PTV      Full                                                        +---------+---------------+---------+-----------+----------+--------------+ PERO      Full                                                        +---------+---------------+---------+-----------+----------+--------------+   +---------+---------------+---------+-----------+----------+--------------+ LEFT     CompressibilityPhasicitySpontaneityPropertiesThrombus Aging +---------+---------------+---------+-----------+----------+--------------+ CFV      Full           Yes      Yes                                 +---------+---------------+---------+-----------+----------+--------------+ SFJ      Full                                                        +---------+---------------+---------+-----------+----------+--------------+ FV Prox  Full                                                        +---------+---------------+---------+-----------+----------+--------------+ FV Mid   Full                                                        +---------+---------------+---------+-----------+----------+--------------+  FV DistalFull                                                        +---------+---------------+---------+-----------+----------+--------------+ PFV      Full                                                        +---------+---------------+---------+-----------+----------+--------------+ POP      Full           Yes      Yes                                 +---------+---------------+---------+-----------+----------+--------------+ PTV      Full                                                        +---------+---------------+---------+-----------+----------+--------------+ PERO     Full                                                        +---------+---------------+---------+-----------+----------+--------------+     Summary: RIGHT: - There is no evidence of deep vein thrombosis in the lower extremity.  - No cystic structure found in the popliteal fossa.  LEFT: - There is no evidence of deep vein thrombosis in the  lower extremity.  - No cystic structure found in the popliteal fossa.  *See table(s) above for measurements and observations.    Preliminary    CT HEAD WO CONTRAST ( ) Result Date: 02/28/2024 CLINICAL DATA:  Provided history: Stroke, follow-up. Additional history provided: Post TNK stroke follow-up. EXAM: CT HEAD WITHOUT CONTRAST TECHNIQUE: Contiguous axial images were obtained from the base of the skull through the vertex without intravenous contrast. RADIATION DOSE REDUCTION: This exam was performed according to the departmental dose-optimization program which includes automated exposure control, adjustment of the mA and/or kV according to patient size and/or use of iterative reconstruction technique. COMPARISON:  Non-contrast head CT and CT angiogram head/neck 02/27/2024. FINDINGS: Brain: Generalized cerebral atrophy. Known chronic cortical/subcortical infarct within the mid left frontal lobe/frontal operculum (MCA vascular territory). Background mild patchy and ill-defined hypoattenuation within the cerebral white matter, nonspecific but compatible with chronic small vessel ischemic disease. There is no acute intracranial hemorrhage. No acute demarcated cortical infarct. No extra-axial fluid collection. No evidence of an intracranial mass. No midline shift. Vascular: No hyperdense vessel.  Atherosclerotic calcifications. Skull: No calvarial fracture or aggressive osseous lesion. Sinuses/Orbits: No mass or acute finding within the imaged orbits. No significant paranasal sinus disease at the imaged levels. IMPRESSION: 1. No evidence of an acute intracranial abnormality. 2. Known chronic left MCA territory infarct within the mid frontal lobe/frontal operculum. 3. Background parenchymal atrophy and chronic small vessel ischemic disease. Electronically Signed   By: Rockey Childs D.O.  On: 02/28/2024 17:36    Vitals:   02/29/24 1000 02/29/24 1100 02/29/24 1200 02/29/24 1300  BP: (!) 155/64 125/72 (!) 113/90  (!) 128/59  Pulse: 60 89 85 78  Resp: 12 (!) 21 15 (!) 23  Temp:   98 F (36.7 C)   TempSrc:   Oral   SpO2: 98% 98% 99% 99%  Weight:      Height:         PHYSICAL EXAM General:  Alert, well-nourished, well-developed patient in no acute distress Psych:  Mood and affect appropriate for situation CV: Regular rate and rhythm on monitor Respiratory:  Regular, unlabored respirations on room air   NEURO:  Mental Status: AA&Ox3, patient is able to give clear and coherent history Speech/Language: speech is without dysarthria or aphasia.  Naming, repetition, fluency, and comprehension intact.  Cranial Nerves:  II: PERRL. Visual fields full.  III, IV, VI: EOMI. Eyelids elevate symmetrically.  VII: Face is symmetrical resting and smiling VIII: hearing intact to voice. IX, X: Phonation is normal.  XII: tongue is midline without fasciculations. Motor: 5/5 strength to all muscle groups tested.  Tone: is normal and bulk is normal Sensation- Intact to light touch bilaterally. Extinction absent to light touch to DSS.   Coordination: FTN intact bilaterally Gait- deferred  Most Recent NIH 0  ASSESSMENT/PLAN  Ms. Michelle Winters is a 89 y.o. female with history of sick sinus syndrome status post pacemaker, stroke in 2024 and hypertension admitted for acute onset aphasia.  She was given TNK to treat potential stroke.  Neurological exam has improved since, with no evidence of aphasia.  MRI is pending.  Will need to interrogate pacemaker to determine if atrial fibrillation is present.  NIH on Admission 8  Stroke: Left brain infarct s/p TNK, etiology: Large/small vessel disease Code Stroke CT head No acute abnormality.  Old left frontal infarct CTA head & neck no LVO or hemodynamically significant stenosis.  Left V2 atherosclerosis 24-hour CT no acute abnormality, known chronic left MCA territory infarct, background atrophy and chronic small vessel ischemic disease MRI pending on  Monday Pacemaker interrogation no atrial fibrillation seen 2D Echo EF 45 to 50% Bilateral lower extremity ultrasound no evidence of DVT LDL 96 HgbA1c 5.5 VTE prophylaxis -SCDs aspirin  81 mg daily prior to admission, aspirin  81 mg daily and Plavix  75 mg daily for 3 weeks followed by Plavix  alone Therapy recommendations: None Disposition: Pending  Hx of Stroke/TIA 11/2022 admitted for left frontal infarct.  CTA negative, CT head and neck negative.  CTP 0/8 cc.  LDL 122, A1c 5.5.  Pacer interrogation no A-fib.  Discharged on DAPT.  Patient declined Leqvio.  Hypertension Home meds: Metoprolol  XL 50 mg daily, losartan  50 mg daily Stable Long-term BP goal normotensive  Hyperlipidemia Home meds: None LDL 96, goal < 70 Add rosuvastatin  20 mg daily Continue statin at discharge  Other Stroke Risk Factors Advanced age CHF CAD Left IJ DVT in 2023  Other Active Problems Hypothyroidism-continue home Synthroid  SSS status post pacemaker  Hospital day # 2  Patient seen by NP with MD, MD to edit note as needed. Cortney E Everitt Clint Kill , MSN, AGACNP-BC [facility] See Amion for schedule and pager information 02/29/2024 1:37 PM  ATTENDING NOTE: I reviewed above note and agree with the assessment and plan. Pt was seen and examined.   Son at the bedside. Pt reclining in bed, neuro intact. 24h CT showed on acute finding. Will start DAPT. Still pending MRI in  am given pacemaker. LE venous doppler negative for PFO. Continue statin. Transfer out of ICU.   For detailed assessment and plan, please refer to above as I have made changes wherever appropriate.   Ary Cummins, MD PhD Stroke Neurology 02/29/2024 5:46 PM

## 2024-03-01 ENCOUNTER — Other Ambulatory Visit (HOSPITAL_COMMUNITY): Payer: Self-pay

## 2024-03-01 ENCOUNTER — Inpatient Hospital Stay (HOSPITAL_COMMUNITY)

## 2024-03-01 DIAGNOSIS — E785 Hyperlipidemia, unspecified: Secondary | ICD-10-CM | POA: Insufficient documentation

## 2024-03-01 DIAGNOSIS — I639 Cerebral infarction, unspecified: Secondary | ICD-10-CM | POA: Diagnosis not present

## 2024-03-01 DIAGNOSIS — R4701 Aphasia: Secondary | ICD-10-CM

## 2024-03-01 DIAGNOSIS — Z8673 Personal history of transient ischemic attack (TIA), and cerebral infarction without residual deficits: Secondary | ICD-10-CM

## 2024-03-01 DIAGNOSIS — N183 Chronic kidney disease, stage 3 unspecified: Secondary | ICD-10-CM | POA: Insufficient documentation

## 2024-03-01 DIAGNOSIS — R29700 NIHSS score 0: Secondary | ICD-10-CM | POA: Diagnosis not present

## 2024-03-01 DIAGNOSIS — G459 Transient cerebral ischemic attack, unspecified: Secondary | ICD-10-CM

## 2024-03-01 DIAGNOSIS — I2510 Atherosclerotic heart disease of native coronary artery without angina pectoris: Secondary | ICD-10-CM | POA: Insufficient documentation

## 2024-03-01 LAB — BASIC METABOLIC PANEL WITH GFR
Anion gap: 9 (ref 5–15)
BUN: 24 mg/dL — ABNORMAL HIGH (ref 8–23)
CO2: 23 mmol/L (ref 22–32)
Calcium: 8 mg/dL — ABNORMAL LOW (ref 8.9–10.3)
Chloride: 102 mmol/L (ref 98–111)
Creatinine, Ser: 1.53 mg/dL — ABNORMAL HIGH (ref 0.44–1.00)
GFR, Estimated: 32 mL/min — ABNORMAL LOW (ref >60)
Glucose, Bld: 126 mg/dL — ABNORMAL HIGH (ref 70–99)
Potassium: 4 mmol/L (ref 3.5–5.1)
Sodium: 134 mmol/L — ABNORMAL LOW (ref 135–145)

## 2024-03-01 LAB — HEMOGLOBIN A1C
Hgb A1c MFr Bld: 5.7 % — ABNORMAL HIGH (ref 4.8–5.6)
Mean Plasma Glucose: 117 mg/dL

## 2024-03-01 LAB — CBC
HCT: 31.8 % — ABNORMAL LOW (ref 36.0–46.0)
Hemoglobin: 10.8 g/dL — ABNORMAL LOW (ref 12.0–15.0)
MCH: 31.4 pg (ref 26.0–34.0)
MCHC: 34 g/dL (ref 30.0–36.0)
MCV: 92.4 fL (ref 80.0–100.0)
Platelets: 133 K/uL — ABNORMAL LOW (ref 150–400)
RBC: 3.44 MIL/uL — ABNORMAL LOW (ref 3.87–5.11)
RDW: 12.5 % (ref 11.5–15.5)
WBC: 7.2 K/uL (ref 4.0–10.5)
nRBC: 0 % (ref 0.0–0.2)

## 2024-03-01 MED ORDER — CLOPIDOGREL BISULFATE 75 MG PO TABS
75.0000 mg | ORAL_TABLET | Freq: Every day | ORAL | 0 refills | Status: AC
  Filled 2024-03-01: qty 30, 30d supply, fill #0

## 2024-03-01 MED ORDER — STROKE: EARLY STAGES OF RECOVERY BOOK
Status: AC
  Filled 2024-03-01: qty 1

## 2024-03-01 MED ORDER — ASPIRIN 81 MG PO TBEC
81.0000 mg | DELAYED_RELEASE_TABLET | Freq: Every day | ORAL | 12 refills | Status: AC
  Filled 2024-03-01: qty 30, 30d supply, fill #0

## 2024-03-01 MED ORDER — ROSUVASTATIN CALCIUM 20 MG PO TABS
20.0000 mg | ORAL_TABLET | Freq: Every day | ORAL | 0 refills | Status: AC
  Filled 2024-03-01: qty 30, 30d supply, fill #0

## 2024-03-01 NOTE — TOC Transition Note (Signed)
 Transition of Care [facility]) - Discharge Note   Patient Details  Name: Michelle Winters MRN: [id number] Date of Birth: Mar 13, 1932  [facility]) CM/SW Contact:  Andrez JULIANNA George, RN Phone Number: 03/01/2024, 1:14 PM   Clinical Narrative:     Pt is discharging home with self care. No follow up per therapies.  Pt uses cane at home. Family provides needed transportation. Pt manages her own medications and denies any issues.  Son is able to provide needed supervision at home. Family will transport home.   Final next level of care: Home/Self Care Barriers to Discharge: No Barriers Identified   Patient Goals and CMS Choice            Discharge Placement                       Discharge Plan and Services Additional resources added to the After Visit Summary for                                       Social Drivers of Health (SDOH) Interventions SDOH Screenings   Food Insecurity: No Food Insecurity (02/29/2024)  Housing: Low Risk  (02/29/2024)  Transportation Needs: No Transportation Needs (02/29/2024)  Utilities: Not At Risk (02/29/2024)  Social Connections: Patient Declined (02/29/2024)  Tobacco Use: Low Risk  (11/19/2023)     Readmission Risk Interventions     No data to display

## 2024-03-01 NOTE — CV Procedure (Signed)
  Device system confirmed to be MRI conditional, with implant date > 6 weeks ago, and no evidence of abandoned or epicardial leads in review of most recent CXR  Device last cleared by EP Provider: PRENTICE PASSEY PA 03/01/24  Clearance is good through for 1 year as long as parameters remain stable at time of check. If pt undergoes a cardiac device procedure during that time, they should be re-cleared.   Tachy-therapies to be programmed off if applicable with device back to pre-MRI settings after completion of exam.  Medtronic - Programming recommendation received through Medtronic App/Tablet  Tobias LITTIE Leander, RT  03/01/2024 3:38 PM

## 2024-03-01 NOTE — TOC CAGE-AID Note (Signed)
 Transition of Care [facility]) - CAGE-AID Screening   Patient Details  Name: Michelle Winters MRN: [id number] Date of Birth: Aug 30, 1931  Transition of Care [facility]) CM/SW Contact:    Debbe Crumble E [name], LCSW Phone Number: 03/01/2024, 8:55 AM   Clinical Narrative:    CAGE-AID Screening:    Have You Ever Felt You Ought to Cut Down on Your Drinking or Drug Use?: No Have People Annoyed You By [employer] Your Drinking Or Drug Use?: No Have You Felt Bad Or Guilty About Your Drinking Or Drug Use?: No Have You Ever Had a Drink or Used Drugs First Thing In The Morning to Steady Your Nerves or to Get Rid of a Hangover?: No CAGE-AID Score: 0  Substance Abuse Education Offered: No

## 2024-03-01 NOTE — Care Management Important Message (Signed)
 Important Message  Patient Details  Name: Michelle Winters MRN: [id number] Date of Birth: 1932-05-13   Important Message Given:  Yes - Medicare IM     Claretta Deed 03/01/2024, 4:23 PM

## 2024-03-04 ENCOUNTER — Telehealth: Payer: Self-pay

## 2024-03-04 DIAGNOSIS — I639 Cerebral infarction, unspecified: Secondary | ICD-10-CM

## 2024-03-05 ENCOUNTER — Telehealth: Payer: Self-pay

## 2024-03-05 NOTE — Transitions of Care (Post Inpatient/ED Visit) (Signed)
 Stroke Discharge Follow-up   03/05/2024 Name:  Michelle Winters MRN:  [id number] DOB:  Dec 24, 1931  Subjective: Michelle Winters is a 89 y.o. year old female who is a primary care patient of Stacia, Alexus, PA An Emmi alert was received indicating patient responded to questions: no questions. Did not take call and thought it was spam. Patient called the office and requested a call back. Placed call to patient today and she reports that she is doing well. Reports that she has all her medications.  NOTED that patient reports that she was taking her losartan  50 mg ---but only a half tablet daily prior to this event and this admission. Reports that she continue her normal dose of 25 mg daily. States that she has continued her half tablet daily.  Reports BP reading today ( 03/05/2024 of 135/69  HR 63,  BP on 03/04/2024  146/77  HR 61,  BP on 03/03/2024 of 144/83  HR 52) I reached out by phone to follow up on the alert and spoke to Patient. Patient denies any weakness, difficulty swallowing or any complication of a stroke. Reports she is back to her normal.   Care Management Interventions: Encouraged patient to see PCP as planned next week. Reviewed all medications on the AVS including doses. Reviewed BP reading and informed patient that they are on the higher side. Encouraged patient to call 911 for any signs or stroke. Reviewed signs of stroke. Encouraged patient to read her discharge instruction ( 28 pages)  Reports she has already read them. Reviewed with patient to call MD for changes in condition. Encouraged patient to continue to self monitor BP and call MD for abnormal readings.  Reviewed EMMI stroke program and call and she would like to be removed from automated called. Leita Lyme informed and will cancel further calls.   Follow up plan: No further intervention required.    Alan Ee, RN, BSN, CEN [employer]- Transition of Care Team.  [facility] [phone]

## 2024-03-10 NOTE — Progress Notes (Unsigned)
  Electrophysiology Office Note:   Date:  03/16/2024  ID:  Michelle Winters, DOB [birth year], MRN [id number]  Primary Cardiologist: None Primary Heart Failure: None Electrophysiologist: OLE ONEIDA HOLTS, MD       History of Present Illness:   Michelle Winters is a 89 y.o. female with h/o SSS s/p PPM, HTN, HFrEF, CKD III & hypothyroidism seen today for routine electrophysiology follow-up s/p pacemaker generator change.   She was admitted to [facility] 8/1-03/01/24 with left sided CVA which was aborted by TNK vs TIA. PPM interrogation did not show AF at that time.   Since last being seen in our clinic the patient reports doing well after her generator change. She has no device related concerns. She does note that she was started on plavix  and statin while in hospital after her TIA and she is having muscle cramps. She has tried many different agents over the years and does not tolerate.    She denies chest pain, palpitations, dyspnea, PND, orthopnea, nausea, vomiting, dizziness, syncope, edema, weight gain, or early satiety.    Review of systems complete and found to be negative unless listed in HPI.    EP Information / Studies Reviewed:    EKG is not ordered today. EKG from 03/01/24 reviewed which showed NSR with 1st degree AVB / prolonged PR 61 bpm      PPM Interrogation-  reviewed in detail today,  See PACEART report.  Device History: Medtronic Dual Chamber PPM implanted 05/11/14 for Sinus Node Dysfunction Generator Change > 12/15/23  Risk Assessment/Calculations:              Physical Exam:   VS:  BP 120/70 (BP Location: Left Arm, Patient Position: Sitting, Cuff Size: Normal)   Pulse 94   Ht 5' 2 (1.575 m)   Wt 125 lb 3.2 oz (56.8 kg)   SpO2 97%   BMI 22.90 kg/m    Wt Readings from Last 3 Encounters:  03/16/24 125 lb 3.2 oz (56.8 kg)  02/27/24 128 lb 4.9 oz (58.2 kg)  12/15/23 129 lb (58.5 kg)     GEN: Well nourished, well developed in no acute distress NECK: No JVD; No carotid  bruits CARDIAC: Regular rate and rhythm, no murmurs, rubs, gallops, PPM site well healed, no tethering  RESPIRATORY:  Clear to auscultation without rales, wheezing or rhonchi  ABDOMEN: Soft, non-tender, non-distended EXTREMITIES:  No edema; No deformity   ASSESSMENT AND PLAN:    SND s/p Medtronic PPM  -Normal PPM function -See Pace Art report -No changes today -well healed post generator change    Hypertension  -well controlled on current regimen   Recent Aborted CVA vs TIA -LVEF 45-50% -continue ASA 81 mg daily  -plavix  75 mg  -crestor  > pt not tolerated from muscle cramping / joint aches standpoint > refer for Lipid Clinic review for Leqvio (she is hoping to avoid Q2 week injection).  Initially offered Repatha.  -has follow up with Neuro planned in 03/2024   Disposition:   Follow up with Dr. HOLTS in 12 months  Signed, Daphne Barrack, NP-C, AGACNP-BC [facility] - Electrophysiology  03/16/2024, 2:47 PM

## 2024-03-15 ENCOUNTER — Telehealth: Payer: Self-pay | Admitting: Adult Health

## 2024-03-15 NOTE — Telephone Encounter (Signed)
 Pt called with a request to discuss medications, pt was informed that the provider she is scheduled to see can not address any medical questions or medication concerns until pt is seen.  Pt was thankful for response.

## 2024-03-16 ENCOUNTER — Encounter: Payer: Self-pay | Admitting: Pulmonary Disease

## 2024-03-16 ENCOUNTER — Ambulatory Visit: Attending: Pulmonary Disease | Admitting: Pulmonary Disease

## 2024-03-16 VITALS — BP 120/70 | HR 94 | Ht 62.0 in | Wt 125.2 lb

## 2024-03-16 DIAGNOSIS — I495 Sick sinus syndrome: Secondary | ICD-10-CM | POA: Diagnosis not present

## 2024-03-16 DIAGNOSIS — E785 Hyperlipidemia, unspecified: Secondary | ICD-10-CM

## 2024-03-16 DIAGNOSIS — I10 Essential (primary) hypertension: Secondary | ICD-10-CM | POA: Diagnosis not present

## 2024-03-16 DIAGNOSIS — Z950 Presence of cardiac pacemaker: Secondary | ICD-10-CM

## 2024-03-16 LAB — CUP PACEART INCLINIC DEVICE CHECK
Date Time Interrogation Session: 20250819144145
Implantable Lead Connection Status: 753985
Implantable Lead Connection Status: 753985
Implantable Lead Implant Date: 20151014
Implantable Lead Implant Date: 20151014
Implantable Lead Location: 753859
Implantable Lead Location: 753860
Implantable Lead Model: 5076
Implantable Lead Model: 5076
Implantable Pulse Generator Implant Date: 20250519

## 2024-03-16 NOTE — Patient Instructions (Signed)
 Medication Instructions:  Your physician recommends that you continue on your current medications as directed. Please refer to the Current Medication list given to you today.  *If you need a refill on your cardiac medications before your next appointment, please call your pharmacy*  Lab Work: None ordered If you have labs (blood work) drawn today and your tests are completely normal, you will receive your results only by: MyChart Message (if you have MyChart) OR A paper copy in the mail If you have any lab test that is abnormal or we need to change your treatment, we will call you to review the results.  Follow-Up: At [facility], you and your health needs are our priority.  As part of our continuing mission to provide you with exceptional heart care, our providers are all part of one team.  This team includes your primary Cardiologist (physician) and Advanced Practice Providers or APPs (Physician Assistants and Nurse Practitioners) who all work together to provide you with the care you need, when you need it.  Your next appointment:   1 year(s)  Provider:   You may see OLE ONEIDA HOLTS, MD or one of the following Advanced Practice Providers on your designated Care Team:   Charlies Arthur, [place] Ozell Jodie Passey, [place] Daphne Barrack, NP

## 2024-03-18 ENCOUNTER — Ambulatory Visit: Payer: Self-pay | Admitting: Cardiology

## 2024-03-18 ENCOUNTER — Ambulatory Visit (INDEPENDENT_AMBULATORY_CARE_PROVIDER_SITE_OTHER)

## 2024-03-18 DIAGNOSIS — I495 Sick sinus syndrome: Secondary | ICD-10-CM

## 2024-03-18 LAB — CUP PACEART REMOTE DEVICE CHECK
Battery Remaining Longevity: 158 mo
Battery Voltage: 3.21 V
Brady Statistic AP VP Percent: 0.09 %
Brady Statistic AP VS Percent: 94.37 %
Brady Statistic AS VP Percent: 0 %
Brady Statistic AS VS Percent: 5.54 %
Brady Statistic RA Percent Paced: 99.66 %
Brady Statistic RV Percent Paced: 0.09 %
Date Time Interrogation Session: 20250820212909
Implantable Lead Connection Status: 753985
Implantable Lead Connection Status: 753985
Implantable Lead Implant Date: 20151014
Implantable Lead Implant Date: 20151014
Implantable Lead Location: 753859
Implantable Lead Location: 753860
Implantable Lead Model: 5076
Implantable Lead Model: 5076
Implantable Pulse Generator Implant Date: 20250519
Lead Channel Impedance Value: 342 Ohm
Lead Channel Impedance Value: 399 Ohm
Lead Channel Impedance Value: 418 Ohm
Lead Channel Impedance Value: 494 Ohm
Lead Channel Pacing Threshold Amplitude: 0.375 V
Lead Channel Pacing Threshold Amplitude: 0.75 V
Lead Channel Pacing Threshold Pulse Width: 0.4 ms
Lead Channel Pacing Threshold Pulse Width: 0.4 ms
Lead Channel Sensing Intrinsic Amplitude: 1.125 mV
Lead Channel Sensing Intrinsic Amplitude: 1.125 mV
Lead Channel Sensing Intrinsic Amplitude: 20.625 mV
Lead Channel Sensing Intrinsic Amplitude: 20.625 mV
Lead Channel Setting Pacing Amplitude: 1.5 V
Lead Channel Setting Pacing Amplitude: 2 V
Lead Channel Setting Pacing Pulse Width: 0.4 ms
Lead Channel Setting Sensing Sensitivity: 0.9 mV
Zone Setting Status: 755011
Zone Setting Status: 755011

## 2024-03-19 ENCOUNTER — Ambulatory Visit: Payer: Self-pay | Admitting: Cardiology

## 2024-03-22 ENCOUNTER — Telehealth: Payer: Self-pay | Admitting: Pulmonary Disease

## 2024-03-22 NOTE — Telephone Encounter (Signed)
 Pt called in to schedule with pharmd. She states she is older and didn't realize she had to make an appt. She asked if she can just start on injections without being seen. Please advise.

## 2024-03-22 NOTE — Telephone Encounter (Signed)
 Called patient and per patient request wanted appointment schedule with pharm D. Made patient aware that this will be forward to the pharm D team and someone will reach out to get that appointment scheduled.

## 2024-03-22 NOTE — Telephone Encounter (Signed)
 Would be better to teach her in person. Recommend appt

## 2024-03-26 NOTE — Telephone Encounter (Signed)
 Called and spoke w patient.  She voices understanding and wants to ask her pcp about giving her the injection.   Will call back if she'd like to make PHarmD appointment and have cardiology prescribe the medication.

## 2024-04-13 NOTE — Progress Notes (Unsigned)
 PATIENT: Michelle Winters DOB: 1931-12-22  REASON FOR VISIT: follow up HISTORY FROM: patient PRIMARY NEUROLOGIST: Dr. Rosemarie  No chief complaint on file.    HISTORY OF PRESENT ILLNESS: Today   Michelle Winters is a 89 y.o. female who has been followed in this office for ***. Returns today for follow-up.   Imaging:   MRI BRAIN:IMPRESSION: Old infarcts in the left frontal lobe and right caudate nucleus. No acute infarct.  CTA Brain: IMPRESSION: 1. No large vessel occlusion, hemodynamically significant stenosis, or aneurysm in the head or neck. 2. Mild atherosclerotic calcification at the right carotid bifurcation without hemodynamically significant stenosis. 3. Left dominant vertebral system with multifocal left V2 segment atherosclerosis without hemodynamically significant stenosis.   FU: Labs: Carotid dopplers? ECHO: Discharge note Work? OSA?   HISTORY (copied from Hospital): 89 y.o. patient with history of sick sinus syndrome status post pacemaker, stroke in 2024 and hypertension was admitted with acute onset aphasia.   HOSPITAL COURSE Patient was given TNK to treat stroke, and aphasia resolved.  Pacemaker was interrogated, and no evidence of atrial fibrillation was found.  She is now stable and ready to be discharged home.   Left brain stroke aborted by TNK vs TIA Code Stroke CT head No acute abnormality.  Old left frontal infarct CTA head & neck no LVO or hemodynamically significant stenosis.  Left V2 atherosclerosis 24-hour CT no acute abnormality, known chronic left MCA territory infarct, background atrophy and chronic small vessel ischemic disease MRI no acute process. Old infarcts in the left frontal lobe and right caudate nucleus.  Pacemaker interrogation no atrial fibrillation seen 2D Echo EF 45 to 50% Bilateral lower extremity ultrasound no evidence of DVT LDL 96 HgbA1c 5.5 aspirin  81 mg daily prior to admission, aspirin  81 mg daily and Plavix  75  mg daily for 3 weeks followed by Plavix  alone Therapy recommendations: None Disposition: Home   Hx of Stroke/TIA 11/2022 admitted for left frontal infarct.  CTA negative, CT head and neck negative.  CTP 0/8 cc.  LDL 122, A1c 5.5.  Pacer interrogation no A-fib.  Discharged on DAPT.  Patient declined Leqvio.   Hypertension Home meds: Metoprolol  XL 50 mg daily, losartan  50 mg daily Stable Long-term BP goal normotensive   Hyperlipidemia Home meds: None LDL 96, goal < 70 Add rosuvastatin  20 mg daily Continue statin at discharge   Other Stroke Risk Factors Advanced age CHF CAD Left IJ DVT in 2023   Other Active Problems Hypothyroidism-continue home Synthroid  SSS status post pacemaker    REVIEW OF SYSTEMS: Out of a complete 14 system review of symptoms, the patient complains only of the following symptoms, and all other reviewed systems are negative.  ALLERGIES: Allergies  Allergen Reactions   Lisinopril Other (See Comments)    Chest tightness   Simvastatin Other (See Comments)    Muscle pain   Welchol [Colesevelam Hcl] Other (See Comments)    Muscle pain    Amiodarone  Other (See Comments)    Lowered heart rate    Ciprofloxacin Nausea Only   Zetia [Ezetimibe] Nausea Only    HOME MEDICATIONS: Outpatient Medications Prior to Visit  Medication Sig Dispense Refill   acetaminophen  (TYLENOL ) 500 MG tablet Take 1,000 mg by mouth every 6 (six) hours as needed for moderate pain (pain score 4-6).     aspirin  EC 81 MG tablet Take 1 tablet (81 mg total) by mouth daily. Swallow whole. 30 tablet 12   calcium -vitamin D  (OSCAL WITH D) 500-5  MG-MCG tablet Take 1 tablet by mouth daily.     chlorthalidone (HYGROTON) 25 MG tablet Take 25 mg by mouth every morning.     clopidogrel  (PLAVIX ) 75 MG tablet Take 1 tablet (75 mg total) by mouth daily. 30 tablet 0   cyanocobalamin  (VITAMIN B12) 1000 MCG tablet Take 1,000 mcg by mouth daily.     levothyroxine  (SYNTHROID , LEVOTHROID) 125 MCG  tablet Take 125 mcg by mouth daily before breakfast.  3   loratadine  (CLARITIN ) 10 MG tablet Take 10 mg by mouth daily as needed for allergies.      losartan  (COZAAR ) 50 MG tablet Take 50 mg by mouth daily.     metoprolol  succinate (TOPROL -XL) 50 MG 24 hr tablet Take 50 mg by mouth daily.     rosuvastatin  (CRESTOR ) 20 MG tablet Take 1 tablet (20 mg total) by mouth daily. 30 tablet 0   No facility-administered medications prior to visit.    PAST MEDICAL HISTORY: Past Medical History:  Diagnosis Date   Arthritis    hands, fingers, toes (05/11/2014)   Bradycardia    CAD (coronary artery disease)    a. cath 07/01/13: LM no obs dz, LAD widely patent no obs dz, LCx normal in appearance, RCA  small, nondominant vessel. Catheter-induced spasm at the ostium, EF 50-55%   Cancer (HCC)    Cardiomyopathy (HCC)    a. Echo 10/15: Inferior and inferolateral AK,  Anterolateral HK, EF 40%;  b.  Echo (1/16):  EF 55-60%, Gr 1 DD, PASP 27, mild to mod TR, trivial AI, mild MR   Chronic kidney disease (CKD), stage III (moderate) (HCC)    Cystitis 03/29/1949   tube to bladder was in a kink & I had polyps; surgically corrected   DVT (deep venous thrombosis) (HCC)    Heart murmur    slight   Heart palpitations    a. paroxysmal SVT, PVCs, NSVT. b. echo 03/27/14: EF 50-55%, nl systolic function, AK of basalinferior myocardium, possible HK of the apicallateral and inferolateral myocardium, Ao trivial regurg, mild MR, mild LAE, trivial pulmonic regurg   Hypertension    Hypothyroidism    Osteopenia 09/27/2015   T score -2.4 FRAX 17%/6.5%   Pacemaker    a.  Medtronic MRI compatible ADVISA pulse generator serial number [id number].   Thyroid  nodule    Wide-complex tachycardia 03/25/2014    PAST SURGICAL HISTORY: Past Surgical History:  Procedure Laterality Date   basal cell carcinoma removal  2023   from nose   CARDIAC CATHETERIZATION  06/28/2013   normal coronary arteries   CATARACT EXTRACTION W/  INTRAOCULAR LENS  IMPLANT, BILATERAL Bilateral    CHOLECYSTECTOMY     DILATION AND CURETTAGE OF UTERUS     HYSTEROSCOPY  03/29/1949   tube to bladder was in a kink & I had polyps; surgically corrected   LEFT HEART CATHETERIZATION WITH CORONARY ANGIOGRAM [name] 07/01/2013   Procedure: LEFT HEART CATHETERIZATION WITH CORONARY ANGIOGRAM;  Surgeon: Ozell JONETTA Fell, MD;  Location: [facility] CATH LAB;  Service: Cardiovascular;  Laterality: [name];   PACEMAKER INSERTION  05/11/2014   MDT dual chamber Advisa pacemaker implanted by Dr Fernande -- MRI compatible system   PERMANENT PACEMAKER INSERTION [name] 05/11/2014   Procedure: PERMANENT PACEMAKER INSERTION;  Surgeon: Elspeth JAYSON Fernande, MD;  Location: [facility] CATH LAB;  Service: Cardiovascular;  Laterality: [name];   PPM GENERATOR CHANGEOUT [name] 12/15/2023   Procedure: PPM GENERATOR CHANGEOUT;  Surgeon: Cindie Ole DASEN, MD;  Location: [facility] INVASIVE CV LAB;  Service:  Cardiovascular;  Laterality: [name];   squamous cell carcinoma removal  2023   Thyroid  nodule biopsy     THYROIDECTOMY [name] 01/26/2016   Procedure: TOTAL THYROIDECTOMY;  Surgeon: Krystal Spinner, MD;  Location: [facility] OR;  Service: General;  Laterality: [name];   TONSILLECTOMY     URETER SURGERY Left many years ago? 1960    FAMILY HISTORY: Family History  Problem Relation Age of Onset   Multiple sclerosis Mother    Heart attack Father    Cancer Sister        Skin cancer   Diabetes Sister    Heart disease Brother    Cancer Brother        Prostate   Leukemia Maternal Grandmother    Nephritis Paternal Grandmother     SOCIAL HISTORY: Social History   Socioeconomic History   Marital status: Married    Spouse name: Not on file   Number of children: Not on file   Years of education: Not on file   Highest education level: Not on file  Occupational History   Not on file  Tobacco Use   Smoking status: Never   Smokeless tobacco: Never  Vaping Use   Vaping status: Never Used  Substance and Sexual Activity    Alcohol use: No    Alcohol/week: 0.0 standard drinks of alcohol   Drug use: No   Sexual activity: Never    Birth control/protection: Post-menopausal    Comment: 1st intercourse 22 yo-1 partner  Other Topics Concern   Not on file  Social History Narrative   Not on file   Social Drivers of Health   Financial Resource Strain: Not on file  Food Insecurity: No Food Insecurity (02/29/2024)   Hunger Vital Sign    Worried About Running Out of Food in the Last Year: Never true    Ran Out of Food in the Last Year: Never true  Transportation Needs: No Transportation Needs (02/29/2024)   PRAPARE - Administrator, Civil Service (Medical): No    Lack of Transportation (Non-Medical): No  Physical Activity: Not on file  Stress: Not on file  Social Connections: Patient Declined (02/29/2024)   Social Connection and Isolation Panel    Frequency of Communication with Friends and Family: Patient declined    Frequency of Social Gatherings with Friends and Family: Patient declined    Attends Religious Services: Patient declined    Database administrator or Organizations: Patient declined    Attends Banker Meetings: Patient declined    Marital Status: Patient declined  Intimate Partner Violence: Unknown (02/29/2024)   Humiliation, Afraid, Rape, and Kick questionnaire    Fear of Current or Ex-Partner: No    Emotionally Abused: No    Physically Abused: Not on file    Sexually Abused: No      PHYSICAL EXAM  There were no vitals filed for this visit. There is no height or weight on file to calculate BMI.  Generalized: Well developed, in no acute distress   Neurological examination  Mentation: Alert oriented to time, place, history taking. Follows all commands speech and language fluent Cranial nerve II-XII: Pupils were equal round reactive to light. Extraocular movements were full, visual field were full on confrontational test. Facial sensation and strength were normal. Uvula  tongue midline. Head turning and shoulder shrug  were normal and symmetric. Motor: The motor testing reveals 5 over 5 strength of all 4 extremities. Good symmetric motor tone is noted throughout.  Sensory:  Sensory testing is intact to soft touch on all 4 extremities. No evidence of extinction is noted.  Coordination: Cerebellar testing reveals good finger-nose-finger and heel-to-shin bilaterally.  Gait and station: Gait is normal. Tandem gait is normal. Romberg is negative. No drift is seen.  Reflexes: Deep tendon reflexes are symmetric and normal bilaterally.   DIAGNOSTIC DATA (LABS, IMAGING, TESTING) - I reviewed patient records, labs, notes, testing and imaging myself where available.  Lab Results  Component Value Date   WBC 7.2 03/01/2024   HGB 10.8 (L) 03/01/2024   HCT 31.8 (L) 03/01/2024   MCV 92.4 03/01/2024   PLT 133 (L) 03/01/2024      Component Value Date/Time   NA 134 (L) 03/01/2024 0445   NA 140 11/19/2023 1453   K 4.0 03/01/2024 0445   CL 102 03/01/2024 0445   CO2 23 03/01/2024 0445   GLUCOSE 126 (H) 03/01/2024 0445   BUN 24 (H) 03/01/2024 0445   BUN 25 11/19/2023 1453   CREATININE 1.53 (H) 03/01/2024 0445   CALCIUM  8.0 (L) 03/01/2024 0445   PROT 6.1 (L) 02/27/2024 1924   ALBUMIN 3.2 (L) 02/27/2024 1924   AST 26 02/27/2024 1924   ALT 12 02/27/2024 1924   ALKPHOS 68 02/27/2024 1924   BILITOT 0.5 02/27/2024 1924   GFRNONAA 32 (L) 03/01/2024 0445   GFRAA 52 (L) 04/01/2019 1035   Lab Results  Component Value Date   CHOL 168 02/28/2024   HDL 53 02/28/2024   LDLCALC 96 02/28/2024   TRIG 96 02/28/2024   CHOLHDL 3.2 02/28/2024   Lab Results  Component Value Date   HGBA1C 5.7 (H) 02/27/2024   Lab Results  Component Value Date   VITAMINB12 225 12/18/2022   Lab Results  Component Value Date   TSH 1.635 12/16/2022      ASSESSMENT AND PLAN 89 y.o. year old female  has a past medical history of Arthritis, Bradycardia, CAD (coronary artery disease), Cancer  (HCC), Cardiomyopathy (HCC), Chronic kidney disease (CKD), stage III (moderate) (HCC), Cystitis (03/29/1949), DVT (deep venous thrombosis) (HCC), Heart murmur, Heart palpitations, Hypertension, Hypothyroidism, Osteopenia (09/27/2015), Pacemaker, Thyroid  nodule, and Wide-complex tachycardia (03/25/2014). here with ***     Continue {anticoagulants:31417}  and ***  for secondary stroke prevention.   Discussed secondary stroke prevention measures and importance of close PCP follow up for aggressive stroke risk factor management. I have gone over the pathophysiology of stroke, warning signs and symptoms, risk factors and their management in some detail with instructions to go to the closest emergency room for symptoms of concern. HTN: BP goal <130/90.  Stable on *** per PCP HLD: LDL goal <70. Recent LDL 96.  DMII: A1c goal<7.0. Recent A1c 5.5.  Encouraged patient to monitor diet and encouraged exercise FU with our office ***  No orders of the defined types were placed in this encounter.  No orders of the defined types were placed in this encounter.     Duwaine Russell, MSN, NP-C 04/13/2024, 4:35 PM [facility] [address] [place], [place] [address] [phone]

## 2024-04-14 ENCOUNTER — Ambulatory Visit (INDEPENDENT_AMBULATORY_CARE_PROVIDER_SITE_OTHER): Admitting: Adult Health

## 2024-04-14 VITALS — BP 130/80 | HR 63 | Ht 62.0 in | Wt 123.4 lb

## 2024-04-14 DIAGNOSIS — I639 Cerebral infarction, unspecified: Secondary | ICD-10-CM

## 2024-04-14 DIAGNOSIS — E785 Hyperlipidemia, unspecified: Secondary | ICD-10-CM

## 2024-04-14 NOTE — Patient Instructions (Signed)
 Your Plan:  Continue plavix  Blood pressure goal <130/90 Cholesterol LDL goal <70 Diabetes goal A1c <7 Monitor diet and try to exercise   Thank you for coming to see [place] at [facility]. I hope we have been able to provide you high quality care today.  You may receive a patient satisfaction survey over the next few weeks. We would appreciate your feedback and comments so that we may continue to improve ourselves and the health of our patients.

## 2024-04-21 NOTE — Progress Notes (Signed)
 Remote PPM Transmission

## 2024-06-08 ENCOUNTER — Telehealth: Payer: Self-pay

## 2024-06-08 NOTE — Patient Outreach (Signed)
 First telephone outreach attempt to obtain mRS. No answer. Left message for returned call.  [facility]  Population Health Care Management Assistant  Direct Dial: [phone]  Fax: [phone] Website: [email or web address]

## 2024-06-11 ENCOUNTER — Telehealth: Payer: Self-pay

## 2024-06-11 NOTE — Patient Outreach (Signed)
 Telephone outreach to patient to obtain mRS was successfully completed. MRS= 0  Shereen Gin [facility] VBCI Assistant Direct Dial: [phone]  Fax: [phone] Website: [email or web address]

## 2024-06-17 ENCOUNTER — Ambulatory Visit

## 2024-06-17 DIAGNOSIS — I495 Sick sinus syndrome: Secondary | ICD-10-CM | POA: Diagnosis not present

## 2024-06-18 LAB — CUP PACEART REMOTE DEVICE CHECK
Battery Remaining Longevity: 154 mo
Battery Voltage: 3.18 V
Brady Statistic AP VP Percent: 0.13 %
Brady Statistic AP VS Percent: 95.18 %
Brady Statistic AS VP Percent: 0 %
Brady Statistic AS VS Percent: 4.69 %
Brady Statistic RA Percent Paced: 99.52 %
Brady Statistic RV Percent Paced: 0.13 %
Date Time Interrogation Session: 20251119182113
Implantable Lead Connection Status: 753985
Implantable Lead Connection Status: 753985
Implantable Lead Implant Date: 20151014
Implantable Lead Implant Date: 20151014
Implantable Lead Location: 753859
Implantable Lead Location: 753860
Implantable Lead Model: 5076
Implantable Lead Model: 5076
Implantable Pulse Generator Implant Date: 20250519
Lead Channel Impedance Value: 323 Ohm
Lead Channel Impedance Value: 380 Ohm
Lead Channel Impedance Value: 418 Ohm
Lead Channel Impedance Value: 494 Ohm
Lead Channel Pacing Threshold Amplitude: 0.375 V
Lead Channel Pacing Threshold Amplitude: 0.75 V
Lead Channel Pacing Threshold Pulse Width: 0.4 ms
Lead Channel Pacing Threshold Pulse Width: 0.4 ms
Lead Channel Sensing Intrinsic Amplitude: 1.25 mV
Lead Channel Sensing Intrinsic Amplitude: 1.25 mV
Lead Channel Sensing Intrinsic Amplitude: 17.375 mV
Lead Channel Sensing Intrinsic Amplitude: 17.375 mV
Lead Channel Setting Pacing Amplitude: 1.5 V
Lead Channel Setting Pacing Amplitude: 2 V
Lead Channel Setting Pacing Pulse Width: 0.4 ms
Lead Channel Setting Sensing Sensitivity: 0.9 mV
Zone Setting Status: 755011
Zone Setting Status: 755011

## 2024-06-21 NOTE — Progress Notes (Signed)
 Remote PPM Transmission

## 2024-06-22 ENCOUNTER — Ambulatory Visit: Payer: Self-pay | Admitting: Cardiology

## 2024-09-16 ENCOUNTER — Encounter

## 2024-12-16 ENCOUNTER — Encounter

## 2025-03-03 ENCOUNTER — Ambulatory Visit: Admitting: Adult Health

## 2025-03-17 ENCOUNTER — Encounter

## 2025-06-16 ENCOUNTER — Encounter

## 2025-09-15 ENCOUNTER — Encounter

## 2025-12-15 ENCOUNTER — Encounter
